# Patient Record
Sex: Female | Born: 1955 | Race: White | Hispanic: No | Marital: Married | Smoking: Never smoker
Health system: Southern US, Community
[De-identification: ages and names within clinical notes are randomized; demographics above are authoritative.]

## PROBLEM LIST (undated history)

## (undated) DIAGNOSIS — J301 Allergic rhinitis due to pollen: Secondary | ICD-10-CM

## (undated) DIAGNOSIS — A630 Anogenital (venereal) warts: Secondary | ICD-10-CM

## (undated) DIAGNOSIS — T7840XA Allergy, unspecified, initial encounter: Secondary | ICD-10-CM

## (undated) DIAGNOSIS — F32A Depression, unspecified: Secondary | ICD-10-CM

## (undated) DIAGNOSIS — M84374A Stress fracture, right foot, initial encounter for fracture: Secondary | ICD-10-CM

## (undated) DIAGNOSIS — J45909 Unspecified asthma, uncomplicated: Secondary | ICD-10-CM

## (undated) DIAGNOSIS — F329 Major depressive disorder, single episode, unspecified: Secondary | ICD-10-CM

## (undated) DIAGNOSIS — G43909 Migraine, unspecified, not intractable, without status migrainosus: Secondary | ICD-10-CM

## (undated) DIAGNOSIS — F419 Anxiety disorder, unspecified: Secondary | ICD-10-CM

## (undated) HISTORY — DX: Allergy, unspecified, initial encounter: T78.40XA

## (undated) HISTORY — DX: Migraine, unspecified, not intractable, without status migrainosus: G43.909

## (undated) HISTORY — DX: Stress fracture, right foot, initial encounter for fracture: M84.374A

## (undated) HISTORY — PX: BREAST SURGERY: SHX581

## (undated) HISTORY — DX: Allergic rhinitis due to pollen: J30.1

## (undated) HISTORY — PX: KNEE SURGERY: SHX244

## (undated) HISTORY — DX: Anogenital (venereal) warts: A63.0

## (undated) HISTORY — DX: Anxiety disorder, unspecified: F41.9

## (undated) HISTORY — PX: COLONOSCOPY: SHX174

## (undated) HISTORY — DX: Depression, unspecified: F32.A

## (undated) HISTORY — DX: Major depressive disorder, single episode, unspecified: F32.9

---

## 1997-11-23 ENCOUNTER — Encounter: Admission: RE | Admit: 1997-11-23 | Discharge: 1997-11-23 | Payer: Self-pay | Admitting: Sports Medicine

## 1999-01-05 ENCOUNTER — Other Ambulatory Visit: Admission: RE | Admit: 1999-01-05 | Discharge: 1999-01-05 | Payer: Self-pay | Admitting: *Deleted

## 2000-01-05 ENCOUNTER — Other Ambulatory Visit: Admission: RE | Admit: 2000-01-05 | Discharge: 2000-01-05 | Payer: Self-pay | Admitting: *Deleted

## 2000-03-29 ENCOUNTER — Encounter: Admission: RE | Admit: 2000-03-29 | Discharge: 2000-03-29 | Payer: Self-pay | Admitting: Internal Medicine

## 2000-03-29 ENCOUNTER — Encounter: Payer: Self-pay | Admitting: Internal Medicine

## 2000-04-09 ENCOUNTER — Other Ambulatory Visit: Admission: RE | Admit: 2000-04-09 | Discharge: 2000-04-09 | Payer: Self-pay | Admitting: General Surgery

## 2000-05-18 ENCOUNTER — Ambulatory Visit (HOSPITAL_BASED_OUTPATIENT_CLINIC_OR_DEPARTMENT_OTHER): Admission: RE | Admit: 2000-05-18 | Discharge: 2000-05-18 | Payer: Self-pay | Admitting: General Surgery

## 2000-05-18 ENCOUNTER — Encounter (INDEPENDENT_AMBULATORY_CARE_PROVIDER_SITE_OTHER): Payer: Self-pay | Admitting: *Deleted

## 2001-01-23 ENCOUNTER — Other Ambulatory Visit: Admission: RE | Admit: 2001-01-23 | Discharge: 2001-01-23 | Payer: Self-pay | Admitting: Obstetrics and Gynecology

## 2002-02-20 ENCOUNTER — Other Ambulatory Visit: Admission: RE | Admit: 2002-02-20 | Discharge: 2002-02-20 | Payer: Self-pay | Admitting: Obstetrics and Gynecology

## 2002-11-07 ENCOUNTER — Encounter (INDEPENDENT_AMBULATORY_CARE_PROVIDER_SITE_OTHER): Payer: Self-pay

## 2002-11-07 ENCOUNTER — Ambulatory Visit (HOSPITAL_COMMUNITY): Admission: RE | Admit: 2002-11-07 | Discharge: 2002-11-07 | Payer: Self-pay | Admitting: Obstetrics and Gynecology

## 2003-03-04 ENCOUNTER — Other Ambulatory Visit: Admission: RE | Admit: 2003-03-04 | Discharge: 2003-03-04 | Payer: Self-pay | Admitting: Obstetrics and Gynecology

## 2004-01-07 ENCOUNTER — Ambulatory Visit: Payer: Self-pay | Admitting: Internal Medicine

## 2004-05-05 ENCOUNTER — Ambulatory Visit: Payer: Self-pay | Admitting: Internal Medicine

## 2004-05-10 ENCOUNTER — Other Ambulatory Visit: Admission: RE | Admit: 2004-05-10 | Discharge: 2004-05-10 | Payer: Self-pay | Admitting: Obstetrics and Gynecology

## 2004-05-24 ENCOUNTER — Encounter: Admission: RE | Admit: 2004-05-24 | Discharge: 2004-05-24 | Payer: Self-pay | Admitting: Obstetrics and Gynecology

## 2005-01-24 ENCOUNTER — Encounter: Admission: RE | Admit: 2005-01-24 | Discharge: 2005-01-24 | Payer: Self-pay | Admitting: Internal Medicine

## 2005-01-24 ENCOUNTER — Ambulatory Visit: Payer: Self-pay | Admitting: Internal Medicine

## 2005-04-04 ENCOUNTER — Ambulatory Visit: Payer: Self-pay | Admitting: Internal Medicine

## 2005-04-25 ENCOUNTER — Ambulatory Visit: Payer: Self-pay | Admitting: Internal Medicine

## 2005-11-03 ENCOUNTER — Ambulatory Visit: Payer: Self-pay | Admitting: Internal Medicine

## 2005-11-10 ENCOUNTER — Ambulatory Visit: Payer: Self-pay | Admitting: Internal Medicine

## 2005-11-29 ENCOUNTER — Encounter: Admission: RE | Admit: 2005-11-29 | Discharge: 2005-11-29 | Payer: Self-pay | Admitting: Internal Medicine

## 2005-11-29 ENCOUNTER — Ambulatory Visit: Payer: Self-pay | Admitting: Internal Medicine

## 2006-10-11 ENCOUNTER — Ambulatory Visit: Payer: Self-pay | Admitting: Internal Medicine

## 2006-10-11 DIAGNOSIS — J029 Acute pharyngitis, unspecified: Secondary | ICD-10-CM | POA: Insufficient documentation

## 2006-10-11 DIAGNOSIS — R51 Headache: Secondary | ICD-10-CM | POA: Insufficient documentation

## 2006-10-11 DIAGNOSIS — R519 Headache, unspecified: Secondary | ICD-10-CM | POA: Insufficient documentation

## 2006-10-11 DIAGNOSIS — J309 Allergic rhinitis, unspecified: Secondary | ICD-10-CM | POA: Insufficient documentation

## 2006-10-11 DIAGNOSIS — G43909 Migraine, unspecified, not intractable, without status migrainosus: Secondary | ICD-10-CM | POA: Insufficient documentation

## 2006-10-11 LAB — CONVERTED CEMR LAB: Rapid Strep: NEGATIVE

## 2006-11-08 ENCOUNTER — Ambulatory Visit: Payer: Self-pay | Admitting: Internal Medicine

## 2006-11-08 DIAGNOSIS — M7120 Synovial cyst of popliteal space [Baker], unspecified knee: Secondary | ICD-10-CM | POA: Insufficient documentation

## 2008-06-23 ENCOUNTER — Telehealth (INDEPENDENT_AMBULATORY_CARE_PROVIDER_SITE_OTHER): Payer: Self-pay | Admitting: *Deleted

## 2008-07-09 ENCOUNTER — Ambulatory Visit: Payer: Self-pay | Admitting: Internal Medicine

## 2008-07-09 DIAGNOSIS — H04129 Dry eye syndrome of unspecified lacrimal gland: Secondary | ICD-10-CM | POA: Insufficient documentation

## 2008-07-09 DIAGNOSIS — L659 Nonscarring hair loss, unspecified: Secondary | ICD-10-CM | POA: Insufficient documentation

## 2008-07-09 LAB — CONVERTED CEMR LAB: Anti Nuclear Antibody(ANA): NEGATIVE

## 2008-07-13 LAB — CONVERTED CEMR LAB
ALT: 13 units/L (ref 0–35)
AST: 22 units/L (ref 0–37)
Albumin: 4 g/dL (ref 3.5–5.2)
Alkaline Phosphatase: 72 units/L (ref 39–117)
BUN: 6 mg/dL (ref 6–23)
Basophils Absolute: 0 10*3/uL (ref 0.0–0.1)
Basophils Relative: 0.5 % (ref 0.0–3.0)
Bilirubin, Direct: 0 mg/dL (ref 0.0–0.3)
CO2: 31 meq/L (ref 19–32)
Calcium: 9.2 mg/dL (ref 8.4–10.5)
Chloride: 104 meq/L (ref 96–112)
Cholesterol: 211 mg/dL — ABNORMAL HIGH (ref 0–200)
Creatinine, Ser: 0.6 mg/dL (ref 0.4–1.2)
Direct LDL: 124.9 mg/dL
Eosinophils Absolute: 0.2 10*3/uL (ref 0.0–0.7)
Eosinophils Relative: 3.2 % (ref 0.0–5.0)
Folate: 14.8 ng/mL
Free T4: 0.8 ng/dL (ref 0.6–1.6)
GFR calc non Af Amer: 110.44 mL/min (ref >60)
Glucose, Bld: 91 mg/dL (ref 70–99)
HCT: 40 % (ref 36.0–46.0)
HDL: 74.4 mg/dL (ref >39.00)
Hemoglobin: 13.9 g/dL (ref 12.0–15.0)
Lymphocytes Relative: 24.1 % (ref 12.0–46.0)
Lymphs Abs: 1.2 10*3/uL (ref 0.7–4.0)
MCHC: 34.9 g/dL (ref 30.0–36.0)
MCV: 98.1 fL (ref 78.0–100.0)
Monocytes Absolute: 0.3 10*3/uL (ref 0.1–1.0)
Monocytes Relative: 6.2 % (ref 3.0–12.0)
Neutro Abs: 3.3 10*3/uL (ref 1.4–7.7)
Neutrophils Relative %: 66 % (ref 43.0–77.0)
Platelets: 221 10*3/uL (ref 150.0–400.0)
Potassium: 3.9 meq/L (ref 3.5–5.1)
RBC: 4.08 M/uL (ref 3.87–5.11)
RDW: 11.1 % — ABNORMAL LOW (ref 11.5–14.6)
Sodium: 140 meq/L (ref 135–145)
T3, Free: 2.6 pg/mL (ref 2.3–4.2)
TSH: 1.59 microintl units/mL (ref 0.35–5.50)
Total Bilirubin: 0.9 mg/dL (ref 0.3–1.2)
Total CHOL/HDL Ratio: 3
Total Protein: 7.5 g/dL (ref 6.0–8.3)
Triglycerides: 82 mg/dL (ref 0.0–149.0)
VLDL: 16.4 mg/dL (ref 0.0–40.0)
Vitamin B-12: 872 pg/mL (ref 211–911)
WBC: 5 10*3/uL (ref 4.5–10.5)

## 2009-04-14 ENCOUNTER — Ambulatory Visit: Payer: Self-pay | Admitting: Family Medicine

## 2009-04-14 DIAGNOSIS — M531 Cervicobrachial syndrome: Secondary | ICD-10-CM | POA: Insufficient documentation

## 2010-01-25 ENCOUNTER — Ambulatory Visit: Payer: Self-pay | Admitting: Internal Medicine

## 2010-01-25 DIAGNOSIS — J019 Acute sinusitis, unspecified: Secondary | ICD-10-CM | POA: Insufficient documentation

## 2010-03-10 NOTE — Assessment & Plan Note (Signed)
Summary: ?sinus inf/cjr   Vital Signs:  Patient profile:   55 year old female Menstrual status:  postmenopausal Height:      67 inches Weight:      174 pounds BMI:     27.35 O2 Sat:      98 % on Room air Temp:     98.2 degrees F oral Pulse rate:   78 / minute BP sitting:   110 / 60  (right arm) Cuff size:   regular  Vitals Entered By: Romualdo Bolk, CMA (AAMA) (January 25, 2010 3:28 PM)  O2 Flow:  Room air CC: Sharp pains around ears, coughing up specs of blood, sinus pressure. This started on 12/19.   History of Present Illness: Carolyn Gutierrez comes in today  for  a few days  for coryza  and then 1 days of  malaise  and today coughed up   yellow phlegm and  and blood mixed in .  bright red  no nose bleeds and no  fever or pneumonia  problems but feels ba and tired and malaise.   now  has pain behind ear s like sinus pain.   but hearing ok  No NVD  no rash or chills   Preventive Screening-Counseling & Management  Alcohol-Tobacco     Alcohol drinks/day: 2     Smoking Status: never  Caffeine-Diet-Exercise     Caffeine use/day: 1-2     Does Patient Exercise: yes     Times/week: 3  Current Medications (verified): 1)  Multiple Vitamins   Tabs (Multiple Vitamin) 2)  Sudafed 30 Mg  Tabs (Pseudoephedrine Hcl) 3)  Estrace 0.1 Mg/gm Crea (Estradiol) .... As Directed  Allergies (verified): No Known Drug Allergies  Past History:  Past medical, surgical, family and social histories (including risk factors) reviewed, and no changes noted (except as noted below).  Past Medical History: Reviewed history from 07/09/2008 and no changes required. Allergic rhinitis Migraine headaches  Poss stress fx right foot 2007  Past Surgical History: Reviewed history from 07/09/2008 and no changes required. Breast Bx Knee Surgery right   Family History: Reviewed history from 07/09/2008 and no changes required. Father: Basal Cell, bladder cancer Mother: Died of pancreatic  cancer Siblings: Sister-melanoma, older sister- dm, hbp  SIS  arthrtis   Social History: Reviewed history from 07/09/2008 and no changes required. Never Smoked hh of 2  2 cats  no ets  is a vegan  taking biotin   Review of Systems       The patient complains of anorexia.  The patient denies vision loss, decreased hearing, chest pain, syncope, dyspnea on exertion, peripheral edema, prolonged cough, abdominal pain, abnormal bleeding, and enlarged lymph nodes.         achy  Physical Exam  General:  congested in nad alert, well-developed, well-nourished, and well-hydrated.   Head:  Normocephalic and atraumatic without obvious abnormalities. No apparent alopecia or balding. Eyes:  vision grossly intact, pupils equal, and pupils round.   Ears:  R ear normal, L ear normal, and no external deformities.   Nose:  no external deformity and no external erythema.  congested  Mouth:  very red  uvula no lesion   aitway good Neck:  tender ac nodes   no pc  supple Lungs:  Normal respiratory effort, chest expands symmetrically. Lungs are clear to auscultation, no crackles or wheezes.no dullness.   Heart:  Normal rate and regular rhythm. S1 and S2 normal without gallop, murmur, click, rub or  other extra sounds. Pulses:  pulses intact without delay   Neurologic:  non focal  Skin:  no ecchymoses and no petechiae.   Cervical Nodes:  see nex exam  Psych:  Oriented X3, normally interactive, good eye contact, and not anxious appearing.     Impression & Recommendations:  Problem # 1:  SINUSITIS - ACUTE-NOS (ICD-461.9) severity of signs and bloody discharge  will do empiric antibiotic rx  ... could be viral with allergic  underlying also  The following medications were removed from the medication list:    Flonase 50 Mcg/act Susp (Fluticasone propionate) .Marland Kitchen... 1 spray as directed Her updated medication list for this problem includes:    Sudafed 30 Mg Tabs (Pseudoephedrine hcl)    Augmentin 500-125 Mg  Tabs (Amoxicillin-pot clavulanate) .Marland Kitchen... 1 by mouth three times a day    Amoxicillin 500 Mg Tabs (Amoxicillin) .Marland Kitchen... 1 by mouth three times a day for sinusitis  Problem # 2:  PHARYNGITIS, ACUTE (ICD-462) with above  Her updated medication list for this problem includes:    Augmentin 500-125 Mg Tabs (Amoxicillin-pot clavulanate) .Marland Kitchen... 1 by mouth three times a day    Amoxicillin 500 Mg Tabs (Amoxicillin) .Marland Kitchen... 1 by mouth three times a day for sinusitis  Problem # 3:  ALLERGIC RHINITIS (ICD-477.9)  The following medications were removed from the medication list:    Flonase 50 Mcg/act Susp (Fluticasone propionate) .Marland Kitchen... 1 spray as directed Her updated medication list for this problem includes:    Sudafed 30 Mg Tabs (Pseudoephedrine hcl)  Complete Medication List: 1)  Multiple Vitamins Tabs (Multiple vitamin) 2)  Sudafed 30 Mg Tabs (Pseudoephedrine hcl) 3)  Estrace 0.1 Mg/gm Crea (Estradiol) .... As directed 4)  Augmentin 500-125 Mg Tabs (Amoxicillin-pot clavulanate) .Marland Kitchen.. 1 by mouth three times a day 5)  Amoxicillin 500 Mg Tabs (Amoxicillin) .Marland Kitchen.. 1 by mouth three times a day for sinusitis  Patient Instructions: 1)  Acute sinusitis symptoms for less than 10 days are not helped by antibiotics. Use warm moist compresses, and over the counter decongestants( only as directed). Call if no improvement in 5-7 days, sooner if increasing pain, fever, or new symptoms.  2)  however because of the severity of  your signs and the bloody phlegm wil treat with antibiotic  for now .    3)  saline nose spray also.  Prescriptions: AMOXICILLIN 500 MG TABS (AMOXICILLIN) 1 by mouth three times a day for sinusitis  #30 x 0   Entered and Authorized by:   Madelin Headings MD   Signed by:   Madelin Headings MD on 01/25/2010   Method used:   Electronically to        [facility] [address] [phone]* (retail)       [address]       [place], [place]  [address]       Ph: [phone] or [phone]       Fax:  [phone]   RxID:   [id number] AUGMENTIN 500-125 MG TABS (AMOXICILLIN-POT CLAVULANATE) 1 by mouth three times a day  #30 x 0   Entered and Authorized by:   Madelin Headings MD   Signed by:   Madelin Headings MD on 01/25/2010   Method used:   Electronically to        [facility] [address] [phone]* (retail)       [address]       [place], [place]  [address]  Ph: [phone] or [phone]       Fax: [phone]   RxID:   [id number]    Orders Added: 1)  Est. Patient Level IV [33295]   cancel the augmentin     pt to take the amoxicillin

## 2010-03-10 NOTE — Assessment & Plan Note (Signed)
Summary: neck pain//ccm   Vital Signs:  Patient profile:   55 year old female Menstrual status:  postmenopausal Temp:     98.8 degrees F oral BP sitting:   130 / 76  (left arm) Cuff size:   regular  Vitals Entered By: Sid Falcon LPN (April 14, 1608 1:51 PM) CC: left side neck pain X 3 days   History of Present Illness: Patient seen for acute visit for left upper neck pain and occipital pain. Symptoms started about 3 days ago it has not been progressive. Symptoms are fleeting usually only last a few seconds occasional throbbing quality. Tylenol and Sudafed helped. No clear exacerbating features. History of migraines but these are somewhat different. No radiation of pain down the upper extremity. Denies any numbness or weakness. No recent sore throat, fever, or rash.  Denies any associated nausea, vomiting, visual change, ataxia, focal weakness.   Not exacerbated with exertion  Allergies (verified): No Known Drug Allergies  Past History:  Past Medical History: Last updated: 07/09/2008 Allergic rhinitis Migraine headaches  Poss stress fx right foot 2007  Family History: Last updated: 07/09/2008 Father: Basal Cell, bladder cancer Mother: Died of pancreatic cancer Siblings: Sister-melanoma, older sister- dm, hbp  SIS  arthrtis   Social History: Last updated: 07/09/2008 Never Smoked hh of 2  2 cats  no ets  is a vegan  taking biotin  PMH reviewed for relevance, PSH reviewed for relevance  Review of Systems  The patient denies anorexia, fever, weight loss, vision loss, and decreased hearing.    Physical Exam  General:  Well-developed,well-nourished,in no acute distress; alert,appropriate and cooperative throughout examination Head:  Normocephalic and atraumatic without obvious abnormalities. No apparent alopecia or balding. Eyes:  pupils equal, pupils round, pupils reactive to light, and no optic disk abnormalities.   Ears:  External ear exam shows no significant  lesions or deformities.  Otoscopic examination reveals clear canals, tympanic membranes are intact bilaterally without bulging, retraction, inflammation or discharge. Hearing is grossly normal bilaterally. Mouth:  Oral mucosa and oropharynx without lesions or exudates.  Teeth in good repair. Neck:  No deformities, masses, or tenderness noted. Lungs:  Normal respiratory effort, chest expands symmetrically. Lungs are clear to auscultation, no crackles or wheezes. Heart:  Normal rate and regular rhythm. S1 and S2 normal without gallop, murmur, click, rub or other extra sounds. Neurologic:  alert & oriented X3, cranial nerves II-XII intact, strength normal in all extremities, gait normal, and DTRs symmetrical and normal.     Impression & Recommendations:  Problem # 1:  OCCIPITAL NEURALGIA (ICD-723.8) symptoms sound more compatible with neuralgia.  Observe for now since symptoms are very fleeting.  Consider consult with [facility] if symptoms persist.  Pt encouraged to f/u sooner if symptoms worsen or change.  Complete Medication List: 1)  Multiple Vitamins Tabs (Multiple vitamin) 2)  Flonase 50 Mcg/act Susp (Fluticasone propionate) .Marland Kitchen.. 1 spray as directed 3)  Sudafed 30 Mg Tabs (Pseudoephedrine hcl) 4)  Estrace 0.1 Mg/gm Crea (Estradiol) .... As directed  Patient Instructions: 1)  Follow up immediately if you note any progression of headache, visual changes, vomiting, focal weakness, exertional worsening of headache, or any other neurological symptoms. 2)  Follow up with Dr Fabian Sharp by next week if symptoms not improving.

## 2010-06-24 NOTE — Op Note (Signed)
   NAMEAdria Gutierrez                         ACCOUNT NO.:  [id number]   MEDICAL RECORD NO.:  [id number]                   PATIENT TYPE:  AMB   LOCATION:  SDC                                  FACILITY:  [facility]   PHYSICIAN:  Michelle L. Vincente Poli, M.D.            DATE OF BIRTH:  06-17-1955   DATE OF PROCEDURE:  11/07/2002  DATE OF DISCHARGE:                                 OPERATIVE REPORT   PREOPERATIVE DIAGNOSIS:  Vulvar pustule.   POSTOPERATIVE DIAGNOSIS:  Vulvar pustule.   PROCEDURE:  Excision of vulvar pustule.   SURGEON:  Michelle L. Vincente Poli, M.D.   ANESTHESIA:  MAC with local.   PROCEDURE:  The patient was taken to the operating room where she was given  sedation and placed in the dorsal lithotomy position.  The vulva was prepped  and draped in the usual sterile fashion.  Examination under anesthesia  revealed a very small pustule arising from the right aspect of the vulva.  Using local, it was infiltrated  beneath it and around it with good results.  A circumferential incision was then made with a scalpel around the vulvar  pustule and it was excised completely.  The incision was then closed using  interrupted 3-0 Monocryl suture.  At the end of the procedure no bleeding  was noted.  An ice pack was placed to the perineum and the patient was taken  to the recovery room in stable condition.                                               Michelle L. Vincente Poli, M.D.    Carolyn Gutierrez  D:  11/07/2002  T:  11/08/2002  Job:  [id number]

## 2010-06-24 NOTE — Op Note (Signed)
[place]  Patient:    Carolyn Gutierrez                        MRN: [id number] Proc. Date: 05/18/00 Adm. Date:  [id number] Attending:  Arlis Porta CC:         Luanna Cole. Lenord Fellers, M.D.   Operative Report  PREOPERATIVE DIAGNOSIS:  Left breast mass.  POSTOPERATIVE DIAGNOSIS:  Left breast mass.  PROCEDURE:  Excision of left breast mass.  SURGEON:  Adolph Pollack, M.D.  ANESTHESIA:  Local (1% lidocaine with epinephrine plus 0.5% plain Marcaine plus sodium bicarbonate) with MAC.  INDICATION:  [name] is a 55 year old female who presented to me with a cystic breast lesion.  I aspirated this but also noted that she had a solid lesion inferiorly and lateral to this at approximately the 2 to 3 oclock position of the left breast.  I did a needle aspiration of this and it was nondiagnostic.  The cystic fluid was consistent with fibrocystic changes.  I reexamined her and the mass was still there and I have recommended excisional biopsy and she was agreeable to that.  PROCEDURE:  She was placed supine on the operating table.  The mass was marked.  She was then given intravenous sedation.  Her left breast was sterilely prepped and draped.  A curvilinear incision was made in the lateral aspect of the left breast after infiltration of local anesthesia.  The mass was palpated, grasped with the Allis forceps and excised sharply.  It was sent fresh to pathology.  The biopsy bed was inspected for bleeding and this controlled with cautery and Vicryl suture ligatures.  Once hemostasis was adequate, the subcutaneous fat was loosely approximated with interrupted 3-0 Vicryl sutures.  The skin was closed with a 4-0 Monocryl subcuticular strips.  Steri-Strips and a sterile dressing were applied.  She tolerated the procedure well without any apparent complications and was taken to the recovery room in satisfactory condition. DD:  05/18/00 TD:   05/18/00 Job: [id number]

## 2011-08-11 ENCOUNTER — Telehealth: Payer: Self-pay | Admitting: Internal Medicine

## 2011-08-11 NOTE — Telephone Encounter (Signed)
(  [phone]

## 2011-08-11 NOTE — Telephone Encounter (Signed)
Caller: Levonne Spiller; PCP: Madelin Headings.; CB#: [phone];  Call regarding Pounding Behind Ear on and off for Past Few Days and Sinus Congestion and Occasional Cough- onset 08/05/11; Mild sore throat, jaw pain and Mild headaches on and off - taking ES Tylenol QD and helps some. She has some yellowish nasal discharge and eyes watery. Triage and Care advice per URI and Ear Sx Protocols and appnt advised within 24 hours for "constant or intermittent dull earache, throbbing in ears or feeling of fullness; may interfere with sleep...". Scheduled at [facility] office for 08/12/11 @ 0945.

## 2011-08-12 ENCOUNTER — Encounter: Payer: Self-pay | Admitting: Family Medicine

## 2011-08-12 ENCOUNTER — Ambulatory Visit (INDEPENDENT_AMBULATORY_CARE_PROVIDER_SITE_OTHER): Payer: 59 | Admitting: Family Medicine

## 2011-08-12 VITALS — BP 120/78 | Temp 97.7°F | Wt 169.0 lb

## 2011-08-12 DIAGNOSIS — J309 Allergic rhinitis, unspecified: Secondary | ICD-10-CM

## 2011-08-12 MED ORDER — FLUTICASONE PROPIONATE 50 MCG/ACT NA SUSP: 2.0000 | Freq: Every day | NASAL | Status: DC

## 2011-08-12 NOTE — Progress Notes (Signed)
  Subjective:    Patient ID: Carolyn Gutierrez, female    DOB: 29-Jan-1956, 56 y.o.   MRN: [id number]  HPI  Carolyn Gutierrez is a 56 yo fem w a 1 week ho of inc. AR despite taking otc claritin d.........[facility] only x 1 of asthma,,,,,,,,,,no fever etc  Review of Systems    gen neg Objective:   Physical Exam  wdwn female nad Heent,,neg exc. For 3 plus nasal edema Lungs,clear no asthma     Assessment & Plan:  AR,,,,,add steroid ns

## 2011-08-12 NOTE — Patient Instructions (Signed)
claritin plain SNS,,,,,,,,one shot bid

## 2011-11-30 ENCOUNTER — Other Ambulatory Visit: Payer: Self-pay | Admitting: Obstetrics and Gynecology

## 2012-01-06 ENCOUNTER — Encounter (HOSPITAL_COMMUNITY): Payer: Self-pay | Admitting: Pharmacist

## 2012-01-09 ENCOUNTER — Encounter (HOSPITAL_COMMUNITY)
Admission: RE | Admit: 2012-01-09 | Discharge: 2012-01-09 | Disposition: A | Discharge status: Home / Self Care | Payer: 59 | Source: Ambulatory Visit | Attending: Obstetrics and Gynecology | Admitting: Obstetrics and Gynecology

## 2012-01-09 ENCOUNTER — Encounter (HOSPITAL_COMMUNITY): Payer: Self-pay

## 2012-01-09 HISTORY — DX: Unspecified asthma, uncomplicated: J45.909

## 2012-01-09 LAB — SURGICAL PCR SCREEN
MRSA, PCR: NEGATIVE
Staphylococcus aureus: NEGATIVE

## 2012-01-09 LAB — CBC
MCHC: 33.6 g/dL (ref 30.0–36.0)
Platelets: 259 10*3/uL (ref 150–400)
RDW: 11.7 % (ref 11.5–15.5)

## 2012-01-09 NOTE — Patient Instructions (Signed)
Your procedure is scheduled on:01/17/12  Enter through the Main Entrance at :7am Pick up desk phone and dial [phone] and inform us of your arrival.  Please call [phone] if you have any problems the morning of surgery.  Remember: Do not eat or drink after midnight:Tuesday   DO NOT wear jewelry, eye make-up, lipstick,body lotion, or dark fingernail polish. Do not shave for 48 hours prior to surgery.  If you are to be admitted after surgery, leave suitcase in car until your room has been assigned.

## 2012-01-16 ENCOUNTER — Encounter (HOSPITAL_COMMUNITY): Payer: Self-pay | Admitting: Anesthesiology

## 2012-01-16 NOTE — Anesthesia Preprocedure Evaluation (Addendum)
Anesthesia Evaluation    Airway Mallampati: II TM Distance: >3 FB Neck ROM: Full    Dental No notable dental hx. (+) Teeth Intact   Pulmonary asthma ,  breath sounds clear to auscultation  Pulmonary exam normal       Cardiovascular negative cardio ROS  Rhythm:Regular Rate:Normal     Neuro/Psych  Headaches, negative psych ROS   GI/Hepatic negative GI ROS, Neg liver ROS,   Endo/Other  negative endocrine ROS  Renal/GU negative Renal ROS  negative genitourinary   Musculoskeletal negative musculoskeletal ROS (+)   Abdominal   Peds  Hematology negative hematology ROS (+)   Anesthesia Other Findings   Reproductive/Obstetrics Thickened Endometrium Right Ovarian Cyst                          Anesthesia Physical Anesthesia Plan  ASA: II  Anesthesia Plan: General   Post-op Pain Management:    Induction: Intravenous  Airway Management Planned: Oral ETT  Additional Equipment:   Intra-op Plan:   Post-operative Plan: Extubation in OR  Informed Consent: I have reviewed the patients History and Physical, chart, labs and discussed the procedure including the risks, benefits and alternatives for the proposed anesthesia with the patient or authorized representative who has indicated his/her understanding and acceptance.   Dental advisory given  Plan Discussed with: CRNA, Anesthesiologist and Surgeon  Anesthesia Plan Comments:         Anesthesia Quick Evaluation

## 2012-01-17 ENCOUNTER — Ambulatory Visit (HOSPITAL_COMMUNITY): Payer: 59 | Admitting: Anesthesiology

## 2012-01-17 ENCOUNTER — Inpatient Hospital Stay (HOSPITAL_COMMUNITY)
Admission: RE | Admit: 2012-01-17 | Discharge: 2012-01-19 | DRG: 743 | Disposition: A | Discharge status: Home / Self Care | Payer: 59 | Source: Ambulatory Visit | Attending: Obstetrics and Gynecology | Admitting: Obstetrics and Gynecology

## 2012-01-17 ENCOUNTER — Encounter (HOSPITAL_COMMUNITY): Payer: Self-pay | Admitting: *Deleted

## 2012-01-17 ENCOUNTER — Encounter (HOSPITAL_COMMUNITY): Payer: Self-pay | Admitting: Anesthesiology

## 2012-01-17 ENCOUNTER — Encounter (HOSPITAL_COMMUNITY)
Admission: RE | Disposition: A | Discharge status: Home / Self Care | Payer: Self-pay | Source: Ambulatory Visit | Attending: Obstetrics and Gynecology

## 2012-01-17 DIAGNOSIS — N840 Polyp of corpus uteri: Secondary | ICD-10-CM | POA: Diagnosis present

## 2012-01-17 DIAGNOSIS — N9489 Other specified conditions associated with female genital organs and menstrual cycle: Principal | ICD-10-CM | POA: Diagnosis present

## 2012-01-17 DIAGNOSIS — L659 Nonscarring hair loss, unspecified: Secondary | ICD-10-CM

## 2012-01-17 DIAGNOSIS — R9389 Abnormal findings on diagnostic imaging of other specified body structures: Secondary | ICD-10-CM | POA: Diagnosis present

## 2012-01-17 DIAGNOSIS — N83209 Unspecified ovarian cyst, unspecified side: Secondary | ICD-10-CM | POA: Diagnosis present

## 2012-01-17 DIAGNOSIS — N8000 Endometriosis of the uterus, unspecified: Secondary | ICD-10-CM | POA: Diagnosis present

## 2012-01-17 DIAGNOSIS — R109 Unspecified abdominal pain: Secondary | ICD-10-CM | POA: Diagnosis present

## 2012-01-17 DIAGNOSIS — J019 Acute sinusitis, unspecified: Secondary | ICD-10-CM

## 2012-01-17 DIAGNOSIS — J029 Acute pharyngitis, unspecified: Secondary | ICD-10-CM

## 2012-01-17 DIAGNOSIS — J309 Allergic rhinitis, unspecified: Secondary | ICD-10-CM

## 2012-01-17 DIAGNOSIS — H04129 Dry eye syndrome of unspecified lacrimal gland: Secondary | ICD-10-CM

## 2012-01-17 DIAGNOSIS — D259 Leiomyoma of uterus, unspecified: Secondary | ICD-10-CM | POA: Diagnosis present

## 2012-01-17 DIAGNOSIS — Z5331 Laparoscopic surgical procedure converted to open procedure: Secondary | ICD-10-CM

## 2012-01-17 DIAGNOSIS — M531 Cervicobrachial syndrome: Secondary | ICD-10-CM

## 2012-01-17 DIAGNOSIS — M7120 Synovial cyst of popliteal space [Baker], unspecified knee: Secondary | ICD-10-CM

## 2012-01-17 DIAGNOSIS — R51 Headache: Secondary | ICD-10-CM

## 2012-01-17 DIAGNOSIS — G43909 Migraine, unspecified, not intractable, without status migrainosus: Secondary | ICD-10-CM

## 2012-01-17 HISTORY — PX: SALPINGOOPHORECTOMY: SHX82

## 2012-01-17 HISTORY — PX: LAPAROSCOPIC ASSISTED VAGINAL HYSTERECTOMY: SHX5398

## 2012-01-17 HISTORY — PX: ABDOMINAL HYSTERECTOMY: SHX81

## 2012-01-17 LAB — BASIC METABOLIC PANEL
BUN: 6 mg/dL (ref 6–23)
Chloride: 104 mEq/L (ref 96–112)
GFR calc Af Amer: >90 mL/min (ref >90)
Potassium: 3.9 mEq/L (ref 3.5–5.1)
Sodium: 141 mEq/L (ref 135–145)

## 2012-01-17 SURGERY — HYSTERECTOMY, VAGINAL, LAPAROSCOPY-ASSISTED
Anesthesia: General | Site: Abdomen | Laterality: Right | Wound class: Clean Contaminated

## 2012-01-17 MED ORDER — FENTANYL CITRATE 0.05 MG/ML IJ SOLN
INTRAMUSCULAR | Status: AC
  Filled 2012-01-17: qty 5

## 2012-01-17 MED ORDER — NALOXONE HCL 0.4 MG/ML IJ SOLN: 0.4000 mg | INTRAMUSCULAR | Status: DC | PRN

## 2012-01-17 MED ORDER — NEOSTIGMINE METHYLSULFATE 1 MG/ML IJ SOLN
INTRAMUSCULAR | Status: DC | PRN
  Administered 2012-01-17: 3 mg via INTRAVENOUS

## 2012-01-17 MED ORDER — HYDROMORPHONE HCL PF 1 MG/ML IJ SOLN
INTRAMUSCULAR | Status: AC
  Administered 2012-01-17: 0.5 mg via INTRAVENOUS
  Filled 2012-01-17: qty 1

## 2012-01-17 MED ORDER — PROPOFOL 10 MG/ML IV EMUL
INTRAVENOUS | Status: DC | PRN
  Administered 2012-01-17: 160 mg via INTRAVENOUS

## 2012-01-17 MED ORDER — FENTANYL CITRATE 0.05 MG/ML IJ SOLN
INTRAMUSCULAR | Status: DC | PRN
  Administered 2012-01-17: 150 ug via INTRAVENOUS
  Administered 2012-01-17: 100 ug via INTRAVENOUS

## 2012-01-17 MED ORDER — PROPOFOL 10 MG/ML IV EMUL
INTRAVENOUS | Status: AC
  Filled 2012-01-17: qty 20

## 2012-01-17 MED ORDER — NEOSTIGMINE METHYLSULFATE 1 MG/ML IJ SOLN
INTRAMUSCULAR | Status: AC
  Filled 2012-01-17: qty 1

## 2012-01-17 MED ORDER — CEFAZOLIN SODIUM-DEXTROSE 2-3 GM-% IV SOLR
INTRAVENOUS | Status: AC
  Filled 2012-01-17: qty 50

## 2012-01-17 MED ORDER — DEXAMETHASONE SODIUM PHOSPHATE 10 MG/ML IJ SOLN
INTRAMUSCULAR | Status: AC
  Filled 2012-01-17: qty 1

## 2012-01-17 MED ORDER — TRAMADOL HCL 50 MG PO TABS
50.0000 mg | ORAL_TABLET | Freq: Four times a day (QID) | ORAL | Status: DC | PRN
  Administered 2012-01-17 – 2012-01-18 (×2): 50 mg via ORAL
  Filled 2012-01-17 (×2): qty 1

## 2012-01-17 MED ORDER — EPHEDRINE 5 MG/ML INJ
INTRAVENOUS | Status: AC
  Filled 2012-01-17: qty 10

## 2012-01-17 MED ORDER — DEXTROSE IN LACTATED RINGERS 5 % IV SOLN
INTRAVENOUS | Status: DC
  Administered 2012-01-17: 17:00:00 via INTRAVENOUS

## 2012-01-17 MED ORDER — DIPHENHYDRAMINE HCL 50 MG/ML IJ SOLN: 12.5000 mg | Freq: Four times a day (QID) | INTRAMUSCULAR | Status: DC | PRN

## 2012-01-17 MED ORDER — IBUPROFEN 600 MG PO TABS
600.0000 mg | ORAL_TABLET | Freq: Four times a day (QID) | ORAL | Status: DC | PRN
  Administered 2012-01-18 – 2012-01-19 (×3): 600 mg via ORAL
  Filled 2012-01-17 (×3): qty 1

## 2012-01-17 MED ORDER — LIDOCAINE HCL (CARDIAC) 20 MG/ML IV SOLN
INTRAVENOUS | Status: AC
  Filled 2012-01-17: qty 5

## 2012-01-17 MED ORDER — MIDAZOLAM HCL 2 MG/2ML IJ SOLN
INTRAMUSCULAR | Status: AC
  Filled 2012-01-17: qty 2

## 2012-01-17 MED ORDER — METOCLOPRAMIDE HCL 5 MG/ML IJ SOLN: 10.0000 mg | Freq: Once | INTRAMUSCULAR | Status: DC | PRN

## 2012-01-17 MED ORDER — KETOROLAC TROMETHAMINE 30 MG/ML IJ SOLN
30.0000 mg | Freq: Once | INTRAMUSCULAR | Status: AC
  Administered 2012-01-17: 30 mg via INTRAVENOUS

## 2012-01-17 MED ORDER — GLYCOPYRROLATE 0.2 MG/ML IJ SOLN
INTRAMUSCULAR | Status: DC | PRN
  Administered 2012-01-17: 0.2 mg via INTRAVENOUS
  Administered 2012-01-17: 0.4 mg via INTRAVENOUS

## 2012-01-17 MED ORDER — DEXAMETHASONE SODIUM PHOSPHATE 4 MG/ML IJ SOLN
INTRAMUSCULAR | Status: DC | PRN
  Administered 2012-01-17: 10 mg via INTRAVENOUS

## 2012-01-17 MED ORDER — HYDROMORPHONE HCL PF 1 MG/ML IJ SOLN
INTRAMUSCULAR | Status: AC
  Filled 2012-01-17: qty 1

## 2012-01-17 MED ORDER — ONDANSETRON HCL 4 MG/2ML IJ SOLN
INTRAMUSCULAR | Status: AC
  Filled 2012-01-17: qty 2

## 2012-01-17 MED ORDER — LACTATED RINGERS IV SOLN
INTRAVENOUS | Status: DC
  Administered 2012-01-17: 07:00:00 via INTRAVENOUS

## 2012-01-17 MED ORDER — BUPIVACAINE HCL (PF) 0.25 % IJ SOLN
INTRAMUSCULAR | Status: DC | PRN
  Administered 2012-01-17: 10 mL

## 2012-01-17 MED ORDER — CEFAZOLIN SODIUM-DEXTROSE 2-3 GM-% IV SOLR
2.0000 g | INTRAVENOUS | Status: AC
  Administered 2012-01-17: 2 g via INTRAVENOUS

## 2012-01-17 MED ORDER — ROCURONIUM BROMIDE 50 MG/5ML IV SOLN
INTRAVENOUS | Status: AC
  Filled 2012-01-17: qty 1

## 2012-01-17 MED ORDER — HYDROMORPHONE HCL PF 1 MG/ML IJ SOLN
INTRAMUSCULAR | Status: DC | PRN
  Administered 2012-01-17 (×2): 0.5 mg via INTRAVENOUS

## 2012-01-17 MED ORDER — KETOROLAC TROMETHAMINE 30 MG/ML IJ SOLN
INTRAMUSCULAR | Status: AC
  Administered 2012-01-17: 30 mg via INTRAVENOUS
  Filled 2012-01-17: qty 1

## 2012-01-17 MED ORDER — SODIUM CHLORIDE 0.9 % IJ SOLN: 9.0000 mL | INTRAMUSCULAR | Status: DC | PRN

## 2012-01-17 MED ORDER — ONDANSETRON HCL 4 MG/2ML IJ SOLN: 4.0000 mg | Freq: Four times a day (QID) | INTRAMUSCULAR | Status: DC | PRN

## 2012-01-17 MED ORDER — LIDOCAINE HCL (CARDIAC) 20 MG/ML IV SOLN
INTRAVENOUS | Status: DC | PRN
  Administered 2012-01-17: 50 mg via INTRAVENOUS

## 2012-01-17 MED ORDER — ROCURONIUM BROMIDE 100 MG/10ML IV SOLN
INTRAVENOUS | Status: DC | PRN
  Administered 2012-01-17: 50 mg via INTRAVENOUS

## 2012-01-17 MED ORDER — MEPERIDINE HCL 25 MG/ML IJ SOLN: 6.2500 mg | INTRAMUSCULAR | Status: DC | PRN

## 2012-01-17 MED ORDER — BUPIVACAINE HCL (PF) 0.25 % IJ SOLN
INTRAMUSCULAR | Status: AC
  Filled 2012-01-17: qty 30

## 2012-01-17 MED ORDER — DIPHENHYDRAMINE HCL 12.5 MG/5ML PO ELIX: 12.5000 mg | ORAL_SOLUTION | Freq: Four times a day (QID) | ORAL | Status: DC | PRN

## 2012-01-17 MED ORDER — HYDROMORPHONE HCL PF 1 MG/ML IJ SOLN
0.2500 mg | INTRAMUSCULAR | Status: DC | PRN
  Administered 2012-01-17 (×4): 0.5 mg via INTRAVENOUS

## 2012-01-17 MED ORDER — SCOPOLAMINE 1 MG/3DAYS TD PT72
MEDICATED_PATCH | TRANSDERMAL | Status: AC
  Administered 2012-01-17: 1.5 mg via TRANSDERMAL
  Filled 2012-01-17: qty 1

## 2012-01-17 MED ORDER — LACTATED RINGERS IR SOLN
Status: DC | PRN
  Administered 2012-01-17: 3000 mL

## 2012-01-17 MED ORDER — ONDANSETRON HCL 4 MG/2ML IJ SOLN
INTRAMUSCULAR | Status: DC | PRN
  Administered 2012-01-17: 4 mg via INTRAVENOUS

## 2012-01-17 MED ORDER — MENTHOL 3 MG MT LOZG: 1.0000 | LOZENGE | OROMUCOSAL | Status: DC | PRN

## 2012-01-17 MED ORDER — OXYCODONE-ACETAMINOPHEN 5-325 MG PO TABS
1.0000 | ORAL_TABLET | ORAL | Status: DC | PRN
  Administered 2012-01-18: 1 via ORAL
  Administered 2012-01-18: 2 via ORAL
  Administered 2012-01-18 – 2012-01-19 (×3): 1 via ORAL
  Filled 2012-01-17: qty 2
  Filled 2012-01-17 (×4): qty 1

## 2012-01-17 MED ORDER — LACTATED RINGERS IV SOLN
INTRAVENOUS | Status: DC
  Administered 2012-01-17 (×4): via INTRAVENOUS

## 2012-01-17 MED ORDER — MIDAZOLAM HCL 5 MG/5ML IJ SOLN
INTRAMUSCULAR | Status: DC | PRN
  Administered 2012-01-17: 2 mg via INTRAVENOUS

## 2012-01-17 MED ORDER — EPHEDRINE SULFATE 50 MG/ML IJ SOLN
INTRAMUSCULAR | Status: DC | PRN
  Administered 2012-01-17 (×2): 10 mg via INTRAVENOUS

## 2012-01-17 MED ORDER — HYDROMORPHONE 0.3 MG/ML IV SOLN
INTRAVENOUS | Status: DC
  Administered 2012-01-17: 12:00:00 via INTRAVENOUS
  Filled 2012-01-17: qty 25

## 2012-01-17 MED ORDER — GLYCOPYRROLATE 0.2 MG/ML IJ SOLN
INTRAMUSCULAR | Status: AC
  Filled 2012-01-17: qty 3

## 2012-01-17 MED ORDER — TEMAZEPAM 15 MG PO CAPS: 15.0000 mg | ORAL_CAPSULE | Freq: Every evening | ORAL | Status: DC | PRN

## 2012-01-17 MED ORDER — HEPARIN SODIUM (PORCINE) 5000 UNIT/ML IJ SOLN
INTRAMUSCULAR | Status: DC | PRN
  Administered 2012-01-17: 5000 [IU]

## 2012-01-17 MED ORDER — SCOPOLAMINE 1 MG/3DAYS TD PT72
1.0000 | MEDICATED_PATCH | TRANSDERMAL | Status: DC
  Administered 2012-01-17: 1.5 mg via TRANSDERMAL

## 2012-01-17 SURGICAL SUPPLY — 47 items
BARRIER ADHS 3X4 INTERCEED (GAUZE/BANDAGES/DRESSINGS) IMPLANT
BLADE SURG 10 STRL SS (BLADE) ×3 IMPLANT
CABLE HIGH FREQUENCY MONO STRZ (ELECTRODE) IMPLANT
CANISTER SUCTION 1500CC (MISCELLANEOUS) ×6 IMPLANT
CHLORAPREP W/TINT 26ML (MISCELLANEOUS) ×3 IMPLANT
CLOTH BEACON ORANGE TIMEOUT ST (SAFETY) ×3 IMPLANT
CONT PATH 16OZ SNAP LID 3702 (MISCELLANEOUS) ×3 IMPLANT
COVER TABLE BACK 60X90 (DRAPES) ×6 IMPLANT
DECANTER SPIKE VIAL GLASS SM (MISCELLANEOUS) ×3 IMPLANT
DERMABOND ADVANCED (GAUZE/BANDAGES/DRESSINGS) ×2
DERMABOND ADVANCED .7 DNX12 (GAUZE/BANDAGES/DRESSINGS) ×4 IMPLANT
ELECT REM PT RETURN 9FT ADLT (ELECTROSURGICAL) ×3
ELECTRODE REM PT RTRN 9FT ADLT (ELECTROSURGICAL) ×2 IMPLANT
EVACUATOR SMOKE 8.L (FILTER) IMPLANT
GLOVE BIO SURGEON STRL SZ 6.5 (GLOVE) ×9 IMPLANT
GLOVE BIO SURGEON STRL SZ7.5 (GLOVE) ×6 IMPLANT
GLOVE BIOGEL PI IND STRL 6.5 (GLOVE) ×4 IMPLANT
GLOVE BIOGEL PI INDICATOR 6.5 (GLOVE) ×2
GOWN STRL REIN XL XLG (GOWN DISPOSABLE) ×15 IMPLANT
NEEDLE INSUFFLATION 14GA 120MM (NEEDLE) ×3 IMPLANT
NS IRRIG 1000ML POUR BTL (IV SOLUTION) ×3 IMPLANT
PACK LAVH (CUSTOM PROCEDURE TRAY) ×3 IMPLANT
PROTECTOR NERVE ULNAR (MISCELLANEOUS) ×3 IMPLANT
SEALER TISSUE G2 CVD JAW 45CM (ENDOMECHANICALS) ×3 IMPLANT
SET IRRIG TUBING LAPAROSCOPIC (IRRIGATION / IRRIGATOR) ×3 IMPLANT
SOLUTION ELECTROLUBE (MISCELLANEOUS) IMPLANT
SPONGE LAP 18X18 X RAY DECT (DISPOSABLE) ×9 IMPLANT
SUT PLAIN 2 0 (SUTURE) ×1
SUT PLAIN ABS 2-0 CT1 27XMFL (SUTURE) ×2 IMPLANT
SUT VIC AB 0 CT1 18XCR BRD8 (SUTURE) ×4 IMPLANT
SUT VIC AB 0 CT1 27 (SUTURE) ×1
SUT VIC AB 0 CT1 27XBRD ANBCTR (SUTURE) ×2 IMPLANT
SUT VIC AB 0 CT1 36 (SUTURE) ×9 IMPLANT
SUT VIC AB 0 CT1 8-18 (SUTURE) ×2
SUT VIC AB 3-0 PS2 18 (SUTURE)
SUT VIC AB 3-0 PS2 18XBRD (SUTURE) IMPLANT
SUT VIC AB 4-0 KS 27 (SUTURE) ×3 IMPLANT
SUT VICRYL 0 TIES 12 18 (SUTURE) ×3 IMPLANT
SUT VICRYL 0 UR6 27IN ABS (SUTURE) ×3 IMPLANT
TOWEL OR 17X24 6PK STRL BLUE (TOWEL DISPOSABLE) ×6 IMPLANT
TRAY FOLEY CATH 14FR (SET/KITS/TRAYS/PACK) ×3 IMPLANT
TROCAR Z-THREAD BLADED 11X100M (TROCAR) ×3 IMPLANT
TROCAR Z-THREAD BLADED 5X100MM (TROCAR) ×3 IMPLANT
TUBING SUCTION BULK 100 FT (MISCELLANEOUS) ×3 IMPLANT
WARMER LAPAROSCOPE (MISCELLANEOUS) ×3 IMPLANT
WATER STERILE IRR 1000ML POUR (IV SOLUTION) ×3 IMPLANT
YANKAUER SUCT BULB TIP NO VENT (SUCTIONS) ×3 IMPLANT

## 2012-01-17 NOTE — Progress Notes (Signed)
History and physical on the chart. No significant changes Proceed with LAVH and RSO Patient marked Consent signed.

## 2012-01-17 NOTE — Progress Notes (Signed)
Dr Brayton Caves at bedside to evaluate need for additional pain medications.  Pt resting, but when awakened, c/o throbbing pain at a level 8.  Has been given Dilaudid and Toradol per order.  Given patient's current VS and respiratory rate, will forego additional pain meds at this time and d/c to floor with PCA as ordered by Dr Vincente Poli.  Patient acknowledges understanding.

## 2012-01-17 NOTE — Transfer of Care (Signed)
Immediate Anesthesia Transfer of Care Note  Patient: Carolyn Gutierrez  Procedure(s) Performed: Procedure(s) (LRB) with comments: LAPAROSCOPIC ASSISTED VAGINAL HYSTERECTOMY (N/A) - attempted  SALPINGO OOPHORECTOMY (Right) HYSTERECTOMY ABDOMINAL (N/A)  Patient Location: PACU  Anesthesia Type:General  Level of Consciousness: awake, alert  and oriented  Airway & Oxygen Therapy: Patient Spontanous Breathing and Patient connected to nasal cannula oxygen  Post-op Assessment: Report given to PACU RN and Post -op Vital signs reviewed and stable  Post vital signs: Reviewed and stable  Complications: No apparent anesthesia complications

## 2012-01-17 NOTE — OR Nursing (Addendum)
Abdominal Hysterectomy and right salpingoopherectomy Timeout and procedure start time of 0848.

## 2012-01-17 NOTE — Brief Op Note (Signed)
01/17/2012  9:39 AM  PATIENT:  Carolyn Gutierrez  56 y.o. female  PRE-OPERATIVE DIAGNOSIS:  thickened enodeometrium, right ovarian cyst  POST-OPERATIVE DIAGNOSIS:  thickened enodeometrium, right ovarian cyst, dense pelvic adhesions  PROCEDURE:  Procedure(s) (LRB) with comments: LAPAROSCOPIC ASSISTED VAGINAL HYSTERECTOMY (N/A) - attempted  SALPINGO OOPHORECTOMY (Right) HYSTERECTOMY ABDOMINAL (N/A) Pelvic Washings SURGEON:  Surgeon(s) and Role:    * Jeani Hawking, MD - Primary    * W Varney Baas, MD - Assisting  PHYSICIAN ASSISTANT:   ASSISTANTS: none   ANESTHESIA:   general  EBL:  Total I/O In: 1800 [I.V.:1800] Out: 150 [Urine:100; Blood:50]  BLOOD ADMINISTERED:none  DRAINS: Urinary Catheter (Foley)   LOCAL MEDICATIONS USED:  MARCAINE     SPECIMEN:  Source of Specimen:  uterus, cervix, right tube and ovary  DISPOSITION OF SPECIMEN:  PATHOLOGY  COUNTS:  YES  TOURNIQUET:  * No tourniquets in log *  DICTATION: .Other Dictation: Dictation Number [id number]  PLAN OF CARE: Admit to inpatient   PATIENT DISPOSITION:  PACU - hemodynamically stable.   Delay start of Pharmacological VTE agent (>24hrs) due to surgical blood loss or risk of bleeding: not applicable

## 2012-01-17 NOTE — Anesthesia Postprocedure Evaluation (Signed)
Anesthesia Post Note  Patient: Carolyn Gutierrez  Procedure(s) Performed: Procedure(s) (LRB): LAPAROSCOPIC ASSISTED VAGINAL HYSTERECTOMY (N/A) SALPINGO OOPHORECTOMY (Right) HYSTERECTOMY ABDOMINAL (N/A)  Anesthesia type: GA  Patient location: PACU  Post pain: Pain level controlled  Post assessment: Post-op Vital signs reviewed  Last Vitals:  Filed Vitals:   01/17/12 1030  BP: 110/66  Pulse: 56  Temp: 36.8 C  Resp: 18    Post vital signs: Reviewed  Level of consciousness: sedated  Complications: No apparent anesthesia complications

## 2012-01-17 NOTE — Op Note (Signed)
Carolyn Gutierrez, Carolyn Gutierrez             ACCOUNT NO.:  [id number]  MEDICAL RECORD NO.:  [id number]  LOCATION:  9310                          FACILITY:  [facility]  PHYSICIAN:  Dillion Stowers L. Devonn Giampietro, M.D.DATE OF BIRTH:  07-28-1955  DATE OF PROCEDURE:  01/17/2012 DATE OF DISCHARGE:                              OPERATIVE REPORT   PREOPERATIVE DIAGNOSIS:  Thickened endometrial stripe, right ovarian cyst.  POSTOPERATIVE DIAGNOSES:  Thickened endometrial stripe, right ovarian cyst.  Dense pelvic adhesions.  PROCEDURE:  Laparoscopy with conversion to exploratory laparotomy, lysis of adhesions, total abdominal hysterectomy, and right salpingo- oophorectomy with pelvic washings.  SURGEON:  Estelle Skibicki L. Vincente Poli, M.D.  ASSISTANTFreddy Finner, M.D.  ANESTHESIA:  General.  EBL:  100 mL.  COMPLICATIONS:  None.  DRAINS:  Foley.  PROCEDURE:  The patient was taken to the operating room.  She was intubated.  She was prepped and draped, and Foley catheter was inserted. A small infraumbilical incision was made and the Veress needle was inserted.  Pneumoperitoneum was performed.  The trocar was inserted. The laparoscope was introduced through the trocar sheath.  Immediately upon insertion, we noticed that she had dense omental adhesions in front of our field, but I was able to turn the camera and placed the patient in Trendelenburg position.  Once I did that, I could see that she had severe and dense adhesions involving the colon to the entire back wall of the uterus completely.  We were unable to visualize even the back wall of the uterus or the left side of the uterus.  She also had dense adhesions involving the bladder all the way to the fundus.  Because of the dense adhesions and the potential risk of a colon injury, I decided to convert to laparotomy.  Dr. Jennette Kettle also came in and visualized through the scope and agreed with me as well.  So, we removed the scope.  The incision was closed with  Dermabond and then made a low-transverse incision at the area of her previous incision, this was carried down to the fascia.  The fascia was scored in the midline and extended laterally.  Rectus muscles were separated in the midline.  The peritoneum was entered bluntly.  A self-retraining retractor was placed in the abdominal cavity, and large and small bowel were placed in the upper abdomen.  At this point, we then elevated the uterus and using Metzenbaum, and sharp and blunt dissection, we carefully and slowly took down the adhesions, they were very dense.  We avoided any type of injury to the colon.  Once we freed up the adhesions in the back wall, then we worked on anteriorly, and in this same way, we used both sharp and blunt dissection, taking down the adhesions.  At that point once we freed the adhesions completely, we then proceeded with the surgery.  Exam revealed that she had a large ovarian cyst on the right ovary that was not adherent, but once we felt the palpated the surface of the cyst, it did feel slightly bumpy.  I did perform pelvic washings and we set that aside and sent it to Pathology.  We then identified the round ligament, which was  attenuated on the right side, suture ligated that, developed anterior and posterior leaves of the broad ligament.  I then developed an avascular window beneath the infundibulopelvic ligament on the right side and placed the Heaney clamp across that.  We then resected.  We secured that pedicle using the suture ligature and a free tie of 0 Vicryl suture.  We then of note noticed that she had a surgically absent left tube and ovary.  I then developed the bladder flap in the front further and then we clamped the uterine artery on the right side at the level of internal os, and it was clamped, cut, and suture ligated using 0 Vicryl suture.  Likewise on the left side of the pelvis, the round ligament was again identified.  It was clamped and  suture ligated, and then we also clamped this uterine artery at the level of the internal os on the left side and that pedicle was clamped, cut, and suture ligated using 0 Vicryl suture.  The cervix was relatively short.  So after developing the bladder flap further and ensuring the bladder was well away from our clamps, we just placed straight Heaney clamps on either side, staying snug beside the cervix.  Each pedicle was clamped, cut, and suture ligated using 0 Vicryl suture.  We then used curved Heaney clamps on either side and clamped just beneath the external os.  The specimen was removed and then identified the cervix, uterus, right tube and right ovary, it was sent to Pathology along with the pelvic washings.  The angle stitches were placed using 0 Vicryl suture and three figure-of-eights were used to close the vaginal cuff with 0 Vicryl suture.  Irrigation was performed.  Hemostasis was excellent.  We noticed that we could not visualize an appendix, that appeared to be absent, maybe it was removed at the time of her previous oophorectomy. The peritoneum and rectus muscles were closed using 0 Vicryl.  There was some omental adhesions to the anterior abdominal wall, but we did not felt like we should take this down because there was no window and because we knew that they would stick back.  The fascia was closed using 0 Vicryl starting each corner and meeting in the midline.  After irrigation of subcutaneous layer, the skin was closed with plain gut, interrupted and the skin was closed with 4-0 Vicryl on a Keith needle. Dermabond was applied.  Local was infiltrated.  All sponge, lap, and instrument counts were correct x2.  The patient went to recovery room in stable condition.     Kydan Shanholtzer L. Vincente Poli, M.D.     Florestine Avers  D:  01/17/2012  T:  01/17/2012  Job:  [id number]

## 2012-01-17 NOTE — H&P (Signed)
56 year old G 0 presents for LAVH and RSO. She presented to my office on October 23 complaining of a pinching sensation on the right side of her lower abdomen. An ultrasound performed showed a very thick endometrial stripe at 3 cm and a 5.9 cm septated cyst on right ovary. CA 125 was normal and endometrial biopsy benign. She had a D and C and Hysteroscopy July 2012 for benign endometrial polyp.  Medical history  Asthma  Surgical history  D and C / Hysteroscopy Knee surgery  History of LSO  Meds None  NKDA  Family history  Bladder and pancreatic cancer Diabetes  ROS Pain Above  Afebrile VSS General alert and oriented Lung CTAB Car RRR Abdomen is soft and nontender  Pelvic Cervix is stenotic Fullness in right adnexal area  IMPRESSION: Thickened endometrial stripe ?endometrial polyp RIght adnexal cyst  PLAN: LAVH RSO Risks of surgery - anesthesia, injury to surrounding organs, bleeding, infection discussed with the patient

## 2012-01-18 LAB — CBC
MCH: 32.6 pg (ref 26.0–34.0)
MCHC: 33.7 g/dL (ref 30.0–36.0)
MCV: 96.5 fL (ref 78.0–100.0)
Platelets: 234 10*3/uL (ref 150–400)
RBC: 3.47 MIL/uL — ABNORMAL LOW (ref 3.87–5.11)
RDW: 11.6 % (ref 11.5–15.5)

## 2012-01-18 LAB — BASIC METABOLIC PANEL
BUN: 6 mg/dL (ref 6–23)
CO2: 28 mEq/L (ref 19–32)
Calcium: 9.2 mg/dL (ref 8.4–10.5)
Chloride: 100 mEq/L (ref 96–112)
Creatinine, Ser: 0.62 mg/dL (ref 0.50–1.10)
GFR calc Af Amer: >90 mL/min (ref >90)

## 2012-01-18 MED FILL — Heparin Sodium (Porcine) Inj 5000 Unit/ML: INTRAMUSCULAR | Qty: 1 | Status: AC

## 2012-01-18 NOTE — Anesthesia Postprocedure Evaluation (Signed)
  Anesthesia Post-op Note  Patient: Carolyn Gutierrez  Procedure(s) Performed: Procedure(s) (LRB) with comments: LAPAROSCOPIC ASSISTED VAGINAL HYSTERECTOMY (N/A) - attempted  SALPINGO OOPHORECTOMY (Right) HYSTERECTOMY ABDOMINAL (N/A)  Patient Location: [facility]  Anesthesia Type:General  Level of Consciousness: awake, alert , oriented and patient cooperative  Airway and Oxygen Therapy: Patient Spontanous Breathing  Post-op Pain: mild  Post-op Assessment: Patient's Cardiovascular Status Stable, Respiratory Function Stable, No signs of Nausea or vomiting and Pain level controlled  Post-op Vital Signs: stable  Complications: No apparent anesthesia complications

## 2012-01-18 NOTE — Progress Notes (Signed)
1 Day Post-Op Procedure(s) (LRB): LAPAROSCOPIC ASSISTED VAGINAL HYSTERECTOMY (N/A) SALPINGO OOPHORECTOMY (Right) HYSTERECTOMY ABDOMINAL (N/A)  Subjective: Patient reports tolerating PO and + flatus.    Objective: I have reviewed patient's vital signs, intake and output, medications and labs.  General: alert, cooperative and appears stated age Vaginal Bleeding: none abdomen is soft and nontender and incision is healing well  Assessment: s/p Procedure(s) (LRB) with comments: LAPAROSCOPIC ASSISTED VAGINAL HYSTERECTOMY (N/A) - attempted  SALPINGO OOPHORECTOMY (Right) HYSTERECTOMY ABDOMINAL (N/A): stable, progressing well and tolerating diet  Plan: Advance diet Encourage ambulation Advance to PO medication Discontinue IV fluids  LOS: 1 day    Haifa Hatton L 01/18/2012, 7:25 AM

## 2012-01-18 NOTE — Addendum Note (Signed)
Addendum  created 01/18/12 9604 by Earmon Phoenix, CRNA   Modules edited:Notes Section

## 2012-01-19 ENCOUNTER — Encounter (HOSPITAL_COMMUNITY): Payer: Self-pay | Admitting: Obstetrics and Gynecology

## 2012-01-19 MED ORDER — OXYCODONE-ACETAMINOPHEN 5-325 MG PO TABS
1.0000 | ORAL_TABLET | ORAL | Status: DC | PRN
Start: 2012-01-19 — End: 2012-03-12

## 2012-01-19 MED ORDER — IBUPROFEN 600 MG PO TABS: 600.0000 mg | ORAL_TABLET | Freq: Four times a day (QID) | ORAL | Status: DC | PRN

## 2012-01-19 NOTE — Progress Notes (Signed)
Pt is discharged in the care of husband. Downstairs per ambulatory. States pain is better. Abdominal incisions are clean and dry. Denies any pain or discomfort. . States  She understands all discharge instructions well.Questions asked and answered. Stable.

## 2012-01-19 NOTE — Discharge Summary (Signed)
Admission Diagnosis: Right ovarian cyst Thickened endometrium  Discharge Diagnosis: Same  Hospital Course: 56 year old female admitted for LAVH, RSO. At the time of surgery she was noted to have dense adhesions involving the colon to the uterus. We performed a TAH and RSO. She did very well post op. By post op day #2 she was ambulating, voiding, and tolerating a regular diet. She was discharged home on POD #2. She was discharged with Ibuprofen and Percocet. She will follow up in 1 week for an incision check. She was advised no driving for 1 week No intercourse for 6 weeks No heavy lifting for 2 weeks Call for fever, redness or abdominal pain

## 2012-01-19 NOTE — Progress Notes (Signed)
2 Days Post-Op Procedure(s) (LRB): LAPAROSCOPIC ASSISTED VAGINAL HYSTERECTOMY (N/A) SALPINGO OOPHORECTOMY (Right) HYSTERECTOMY ABDOMINAL (N/A)  Subjective: Patient reports tolerating PO, + flatus and no problems voiding.    Objective: I have reviewed patient's vital signs, intake and output, medications and labs.  General: alert, cooperative and appears stated age Vaginal Bleeding: none Abdomen is soft and non tender and incision clean dry and intact  Assessment: s/p Procedure(s) (LRB) with comments: LAPAROSCOPIC ASSISTED VAGINAL HYSTERECTOMY (N/A) - attempted  SALPINGO OOPHORECTOMY (Right) HYSTERECTOMY ABDOMINAL (N/A): stable, progressing well and tolerating diet  Plan: Discharge home Rx Ibuprofen and Percocet  LOS: 2 days    Darnella Zeiter L 01/19/2012, 7:44 AM

## 2012-01-19 NOTE — Plan of Care (Signed)
Problem: Phase II Progression Outcomes Goal: Incision/dressings dry and intact Outcome: Completed/Met Date Met:  01/19/12 Bruising below incision

## 2012-02-28 ENCOUNTER — Other Ambulatory Visit: Payer: Self-pay | Admitting: Obstetrics and Gynecology

## 2012-02-28 DIAGNOSIS — R928 Other abnormal and inconclusive findings on diagnostic imaging of breast: Secondary | ICD-10-CM

## 2012-03-08 ENCOUNTER — Ambulatory Visit
Admission: RE | Admit: 2012-03-08 | Discharge: 2012-03-08 | Disposition: A | Discharge status: Home / Self Care | Payer: 59 | Source: Ambulatory Visit | Attending: Obstetrics and Gynecology | Admitting: Obstetrics and Gynecology

## 2012-03-08 ENCOUNTER — Other Ambulatory Visit: Payer: Self-pay | Admitting: Obstetrics and Gynecology

## 2012-03-08 DIAGNOSIS — R928 Other abnormal and inconclusive findings on diagnostic imaging of breast: Secondary | ICD-10-CM

## 2012-03-08 DIAGNOSIS — N6320 Unspecified lump in the left breast, unspecified quadrant: Secondary | ICD-10-CM

## 2012-03-12 ENCOUNTER — Ambulatory Visit (INDEPENDENT_AMBULATORY_CARE_PROVIDER_SITE_OTHER): Payer: 59 | Admitting: Internal Medicine

## 2012-03-12 ENCOUNTER — Encounter: Payer: Self-pay | Admitting: Internal Medicine

## 2012-03-12 VITALS — BP 112/70 | HR 76 | Temp 98.0°F | Wt 167.0 lb

## 2012-03-12 DIAGNOSIS — R229 Localized swelling, mass and lump, unspecified: Secondary | ICD-10-CM

## 2012-03-12 DIAGNOSIS — IMO0002 Reserved for concepts with insufficient information to code with codable children: Secondary | ICD-10-CM

## 2012-03-12 NOTE — Patient Instructions (Signed)
This may be a cyst  But because of location and size would get ortho  to see this and advise. Someone will contact you about.  The appt.

## 2012-03-12 NOTE — Progress Notes (Signed)
Chief Complaint  Patient presents with  . Lump on bottom of foot    First noticed on Sunday when she walked.  Some what painful when wearing shoes.    HPI: Patient comes in today for SDA for  new problem evaluation.  insidious onset about 6 months ago  Of fullness left arch of foot and increase recently bothered her in some shoes . Noted large this past week.    No numbness, pain .  Trauma  Walks a lot  .  No redness no rx  ROS: See pertinent positives and negatives per HPI.  Had total hyst  In December for benign reasons.   Past Medical History  Diagnosis Date  . Allergy   . Migraine   . Stress fracture of right foot   . Asthma     in the past- no inhaler currently    Family History  Problem Relation Age of Onset  . Cancer Mother     pancreatic  . Cancer Father     basal cell, bladder  . Cancer Sister     melanoma  . Diabetes Sister   . Hypertension Sister   . Arthritis Sister     History   Social History  . Marital Status: Married    Spouse Name: N/A    Number of Children: N/A  . Years of Education: N/A   Social History Main Topics  . Smoking status: Never Smoker   . Smokeless tobacco: None  . Alcohol Use: Yes     Comment: beer daily  . Drug Use: No  . Sexually Active:    Other Topics Concern  . None   Social History Narrative   hh of 22 catsNo etsIs a vegan - taking biotin    Outpatient Encounter Prescriptions as of 03/12/2012  Medication Sig Dispense Refill  . CALCIUM PO Take 1 tablet by mouth daily.      . Cholecalciferol (VITAMIN D PO) Take 1 tablet by mouth daily.      . Cyanocobalamin (B-12 PO) Take 1 tablet by mouth daily.      Marland Kitchen ibuprofen (ADVIL,MOTRIN) 600 MG tablet Take 1 tablet (600 mg total) by mouth every 6 (six) hours as needed (mild pain).  60 tablet  0  . Multiple Vitamin (MULTIVITAMIN WITH MINERALS) TABS Take 1 tablet by mouth daily.      . Zinc 50 MG TABS Take 1 tablet by mouth daily.      . [DISCONTINUED] oxyCODONE-acetaminophen  (PERCOCET/ROXICET) 5-325 MG per tablet Take 1-2 tablets by mouth every 4 (four) hours as needed.  30 tablet  0    EXAM:  BP 112/70  Pulse 76  Temp 98 F (36.7 C) (Oral)  Wt 167 lb (75.751 kg)  SpO2 98%  There is no height on file to calculate BMI.  GENERAL: vitals reviewed and listed above, alert, oriented, appears well hydrated and in no acute distress Left foot   With 3 cm 2.5 firm cystic type lesion arch of foot  Non tender ? Mobile  nv other wise  Normal  PSYCH: pleasant and cooperative, no obvious depression or anxiety  ASSESSMENT AND PLAN: Discussed the following assessment and plan:  1. Lump on foot     ? cyst but fairly large and on arch and growing  left foot     -Patient advised to return or notify health care team  if symptoms worsen or persist or new concerns arise.  Patient Instructions  This may be a  cyst  But because of location and size would get ortho  to see this and advise. Someone will contact you about.  The appt.    Neta Mends. Izeyah Deike M.D.

## 2012-03-15 ENCOUNTER — Ambulatory Visit
Admission: RE | Admit: 2012-03-15 | Discharge: 2012-03-15 | Disposition: A | Discharge status: Home / Self Care | Payer: 59 | Source: Ambulatory Visit | Attending: Obstetrics and Gynecology | Admitting: Obstetrics and Gynecology

## 2012-03-15 ENCOUNTER — Inpatient Hospital Stay: Admission: RE | Admit: 2012-03-15 | Payer: 59 | Source: Ambulatory Visit

## 2012-03-15 ENCOUNTER — Other Ambulatory Visit: Payer: Self-pay | Admitting: Obstetrics and Gynecology

## 2012-03-15 DIAGNOSIS — N6320 Unspecified lump in the left breast, unspecified quadrant: Secondary | ICD-10-CM

## 2012-03-23 ENCOUNTER — Other Ambulatory Visit: Payer: Self-pay

## 2012-10-25 ENCOUNTER — Telehealth: Payer: Self-pay | Admitting: Internal Medicine

## 2012-10-25 NOTE — Telephone Encounter (Signed)
Called and spoke to the pt.  Informed her that she would need to be seen in the office to get an antibiotic.  She was already in the [place].  Advised she could stop at an urgent care along the way.

## 2012-10-25 NOTE — Telephone Encounter (Signed)
Caller: Carolyn Gutierrez/Patient; Phone: [phone]. 10/25/12 - Patient called because she is having frequent urination, no pain, no blood or fever and thinks she is getting a UTI. She was suppose to leave for [facility] earlier today, but will be leaving a soon as possible and will not be back until next Wednesday 10/30/12. She wants to know if her PCP - Dr. Berniece Andreas (or one of the other doctors could call in an antibiotic for her. Advised her that the doctors usually do not call in antibiotics for a patient without seeing them. Offered to find an appt for her this afternoon (10/25/12), but she refused. She asked if a note could be sent to her PCP and if the antibiotic could be called in to her pharmacy ([facility] on [address] so she could have it filled at [id number] in [place]. Please call her on her cell phone @ [phone].

## 2012-12-12 ENCOUNTER — Other Ambulatory Visit: Payer: Self-pay

## 2013-04-29 ENCOUNTER — Other Ambulatory Visit: Payer: Self-pay | Admitting: Obstetrics and Gynecology

## 2013-10-29 ENCOUNTER — Encounter: Payer: Self-pay | Admitting: Family Medicine

## 2013-10-29 ENCOUNTER — Ambulatory Visit (INDEPENDENT_AMBULATORY_CARE_PROVIDER_SITE_OTHER): Payer: 59 | Admitting: Family Medicine

## 2013-10-29 VITALS — BP 102/72 | HR 81 | Temp 97.7°F | Ht 67.0 in | Wt 153.5 lb

## 2013-10-29 DIAGNOSIS — B888 Other specified infestations: Secondary | ICD-10-CM

## 2013-10-29 DIAGNOSIS — F411 Generalized anxiety disorder: Secondary | ICD-10-CM

## 2013-10-29 DIAGNOSIS — L853 Xerosis cutis: Secondary | ICD-10-CM

## 2013-10-29 DIAGNOSIS — B882 Other arthropod infestations: Secondary | ICD-10-CM

## 2013-10-29 DIAGNOSIS — L851 Acquired keratosis [keratoderma] palmaris et plantaris: Secondary | ICD-10-CM

## 2013-10-29 NOTE — Patient Instructions (Signed)
-  hypoallergenic regimen for skin  -call exterminator

## 2013-10-29 NOTE — Progress Notes (Signed)
No chief complaint on file.   HPI:  Acute visit for Insect bites: -took in a dog and dog has fleas -dogs and cats being treated for fleas and she if VERY anxious about fleas in the home, transferring fleas to others, etc -feels a bite sensation at times -wonders if getting bit for a few weeks -occ burning sensation on skin throughout - sprayed chairs and sofas, etc with a flea and tick spray -using antibacterial soap -she is VERY anxious and stressed about fleas in the house, husband thinks she is crazy -denies: malaise, HA, fevers, breathing trouble, weakness, numbness  ROS: See pertinent positives and negatives per HPI.  Past Medical History  Diagnosis Date  . Allergy   . Migraine   . Stress fracture of right foot   . Asthma     in the past- no inhaler currently    Past Surgical History  Procedure Laterality Date  . Breast surgery      breast bx  . Knee surgery      right  . Laparoscopic assisted vaginal hysterectomy  01/17/2012    Procedure: LAPAROSCOPIC ASSISTED VAGINAL HYSTERECTOMY;  Surgeon: [name], MD;  Location: [facility];  Service: Gynecology;  Laterality: N/A;  attempted   . Salpingoophorectomy  01/17/2012    Procedure: SALPINGO OOPHORECTOMY;  Surgeon: [name], MD;  Location: [facility];  Service: Gynecology;  Laterality: Right;  . Abdominal hysterectomy  01/17/2012    Procedure: HYSTERECTOMY ABDOMINAL;  Surgeon: [name], MD;  Location: [facility];  Service: Gynecology;  Laterality: N/A;    Family History  Problem Relation Age of Onset  . Cancer Mother     pancreatic  . Cancer Father     basal cell, bladder  . Cancer Sister     melanoma  . Diabetes Sister   . Hypertension Sister   . Arthritis Sister     History   Social History  . Marital Status: Married    Spouse Name: N/A    Number of Children: N/A  . Years of Education: N/A   Social History Main Topics  . Smoking status: Never Smoker   . Smokeless tobacco: None  .  Alcohol Use: Yes     Comment: beer daily  . Drug Use: No  . Sexual Activity:    Other Topics Concern  . None   Social History Narrative   hh of 2   2 cats   No ets   Is a vegan - taking biotin    Current outpatient prescriptions:FLUoxetine (PROZAC) 20 MG tablet, Take 20 mg by mouth daily., Disp: , Rfl: ;  CALCIUM PO, Take 1 tablet by mouth daily., Disp: , Rfl: ;  Cholecalciferol (VITAMIN D PO), Take 1 tablet by mouth daily., Disp: , Rfl: ;  Cyanocobalamin (B-12 PO), Take 1 tablet by mouth daily., Disp: , Rfl:  ibuprofen (ADVIL,MOTRIN) 600 MG tablet, Take 1 tablet (600 mg total) by mouth every 6 (six) hours as needed (mild pain)., Disp: 60 tablet, Rfl: 0;  Multiple Vitamin (MULTIVITAMIN WITH MINERALS) TABS, Take 1 tablet by mouth daily., Disp: , Rfl:   EXAM:  Filed Vitals:   10/29/13 1057  BP: 102/72  Pulse: 81  Temp: 97.7 F (36.5 C)    Body mass index is 24.04 kg/(m^2).  GENERAL: vitals reviewed and listed above, alert, oriented, appears well hydrated and in no acute distress  HEENT: atraumatic, conjunttiva clear, no obvious abnormalities on inspection of external nose and ears  NECK:  no obvious masses on inspection  SKIN: normal except for mildly dry skin  MS: moves all extremities without noticeable abnormality  PSYCH: pleasant and cooperative, no obvious depression, anxious  ASSESSMENT AND PLAN:  Discussed the following assessment and plan:  Flea infestation  Anxiety state, unspecified  Dry skin  >25 minutes spentwith this patient, > 50% of the time counseling of flea infestation, treatment and management and the following: -discussed fleas and treatment and advised she contact an exterminator - number provided -no abnormal findings on exam today so appears she is not allergic to fleas -hypoallergenic regimen for dry skin as the harsh soap may be irritating the skin -follow up if any concerns or persistent symptoms -Patient advised to return or notify a  doctor immediately if symptoms worsen or persist or new concerns arise.  Patient Instructions  -hypoallergenic regimen for skin  -call exterminator     Colin Benton R.

## 2013-10-29 NOTE — Progress Notes (Signed)
Pre visit review using our clinic review tool, if applicable. No additional management support is needed unless otherwise documented below in the visit note. 

## 2013-11-15 ENCOUNTER — Encounter (HOSPITAL_COMMUNITY): Payer: Self-pay | Admitting: Emergency Medicine

## 2013-11-15 ENCOUNTER — Emergency Department (HOSPITAL_COMMUNITY)
Admission: EM | Admit: 2013-11-15 | Discharge: 2013-11-17 | Disposition: A | Discharge status: Other Acute Inpatient Hospital | Payer: 59 | Attending: Emergency Medicine | Admitting: Emergency Medicine

## 2013-11-15 DIAGNOSIS — F22 Delusional disorders: Secondary | ICD-10-CM | POA: Diagnosis not present

## 2013-11-15 DIAGNOSIS — Z008 Encounter for other general examination: Secondary | ICD-10-CM | POA: Diagnosis present

## 2013-11-15 DIAGNOSIS — Z8679 Personal history of other diseases of the circulatory system: Secondary | ICD-10-CM | POA: Insufficient documentation

## 2013-11-15 DIAGNOSIS — Z8781 Personal history of (healed) traumatic fracture: Secondary | ICD-10-CM | POA: Diagnosis not present

## 2013-11-15 DIAGNOSIS — J45909 Unspecified asthma, uncomplicated: Secondary | ICD-10-CM | POA: Diagnosis not present

## 2013-11-15 DIAGNOSIS — Z79899 Other long term (current) drug therapy: Secondary | ICD-10-CM | POA: Insufficient documentation

## 2013-11-15 LAB — CBC
HEMATOCRIT: 44.1 % (ref 36.0–46.0)
HEMOGLOBIN: 15.2 g/dL — AB (ref 12.0–15.0)
MCH: 32.8 pg (ref 26.0–34.0)
MCHC: 34.5 g/dL (ref 30.0–36.0)
MCV: 95.2 fL (ref 78.0–100.0)
PLATELETS: 316 10*3/uL (ref 150–400)
RBC: 4.63 MIL/uL (ref 3.87–5.11)
RDW: 11.5 % (ref 11.5–15.5)
WBC: 9.4 10*3/uL (ref 4.0–10.5)

## 2013-11-15 LAB — COMPREHENSIVE METABOLIC PANEL
ALT: 11 U/L (ref 0–35)
AST: 14 U/L (ref 0–37)
Albumin: 4.4 g/dL (ref 3.5–5.2)
Alkaline Phosphatase: 72 U/L (ref 39–117)
Anion gap: 13 (ref 5–15)
BILIRUBIN TOTAL: 0.8 mg/dL (ref 0.3–1.2)
BUN: 5 mg/dL — AB (ref 6–23)
CHLORIDE: 97 meq/L (ref 96–112)
CO2: 26 meq/L (ref 19–32)
CREATININE: 0.46 mg/dL — AB (ref 0.50–1.10)
Calcium: 9.7 mg/dL (ref 8.4–10.5)
GFR calc Af Amer: >90 mL/min (ref >90)
GLUCOSE: 116 mg/dL — AB (ref 70–99)
Potassium: 3.5 mEq/L — ABNORMAL LOW (ref 3.7–5.3)
Sodium: 136 mEq/L — ABNORMAL LOW (ref 137–147)
Total Protein: 7.6 g/dL (ref 6.0–8.3)

## 2013-11-15 LAB — URINALYSIS, ROUTINE W REFLEX MICROSCOPIC
Bilirubin Urine: NEGATIVE
GLUCOSE, UA: NEGATIVE mg/dL
Ketones, ur: 15 mg/dL — AB
Leukocytes, UA: NEGATIVE
Nitrite: NEGATIVE
Protein, ur: NEGATIVE mg/dL
Specific Gravity, Urine: 1.005 (ref 1.005–1.030)
UROBILINOGEN UA: 0.2 mg/dL (ref 0.0–1.0)
pH: 6.5 (ref 5.0–8.0)

## 2013-11-15 LAB — RAPID URINE DRUG SCREEN, HOSP PERFORMED
Amphetamines: NOT DETECTED
BARBITURATES: NOT DETECTED
BENZODIAZEPINES: NOT DETECTED
Cocaine: NOT DETECTED
Opiates: NOT DETECTED
Tetrahydrocannabinol: NOT DETECTED

## 2013-11-15 LAB — ETHANOL

## 2013-11-15 LAB — SALICYLATE LEVEL: Salicylate Lvl: <2 mg/dL — ABNORMAL LOW (ref 2.8–20.0)

## 2013-11-15 LAB — ACETAMINOPHEN LEVEL: Acetaminophen (Tylenol), Serum: <15 ug/mL (ref 10–30)

## 2013-11-15 LAB — URINE MICROSCOPIC-ADD ON

## 2013-11-15 MED ORDER — ONDANSETRON HCL 4 MG PO TABS: 4.0000 mg | ORAL_TABLET | Freq: Three times a day (TID) | ORAL | Status: DC | PRN

## 2013-11-15 MED ORDER — IBUPROFEN 200 MG PO TABS: 600.0000 mg | ORAL_TABLET | Freq: Three times a day (TID) | ORAL | Status: DC | PRN

## 2013-11-15 MED ORDER — ACETAMINOPHEN 325 MG PO TABS: 650.0000 mg | ORAL_TABLET | ORAL | Status: DC | PRN

## 2013-11-15 NOTE — BH Assessment (Signed)
Tucker, Steedley (Spouse) [phone] or [phone] and Jeanella Craze (Sister) [phone] or [phone] would like to be involved in patient's care as much as possible. Patient has signed a consent form and both individuals names are listed on this form. Writer has placed consent form in patient's chart.

## 2013-11-15 NOTE — ED Notes (Signed)
She comes to [place] with her sister and an adult female, who tell me pt. Has been exhibiting strange, paranoid behavior, which began rather abruptly on about Labor Day this year.  Among other things she continually battle with "fleas" of which I, nor her family see any evidence.  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  She is nervous and jittery in her behavior and in no distress.  They were able to get her to agree to come here under the guise of "get some medicine for the fleas".

## 2013-11-15 NOTE — ED Notes (Signed)
Pt and belongings wanded 

## 2013-11-15 NOTE — ED Notes (Signed)
Pts female visitor and sister took her belongs that were put in her bag to be wanded.

## 2013-11-15 NOTE — BH Assessment (Addendum)
Assessment Note  Carolyn Gutierrez is an 58 y.o. female that presents to [employer] with spouse and older sister. Family state that patient has exhibited bizarre behaviors starting Labor Day weekend. Apparently, patient adopted a dog who had fleas. The dog and patient's home was treated for fleas. Patient has continued complaining of dog fleas. Patient apparently scratches and looks for the fleas on her body consistently. Patient may have delusional thoughts stating she may be arrested by fleas. She also thinks that the fleas have taken over her home and or office at work. Patient reports that she was fired at her job this past Friday b/c of the fleas. Pt's spouse sts that this is not true and he has contacted her coworkers about pt's behaviors. Reportedly coworkers have noticed patient's bizarre behaviors and report concerns. Pt's office at work was treated for fleas 2-3x's since Labor Day. Coworkers have also followed patient home to insure that she makes it home ok.   Patient denies current SI and HI. However, patient reports suicidal ideations on/off since Labor Day. Sts that her last suicidal thoughts were last week. Pt did not have a suicide plan last week but reports that she was trying to think of a way to end her life. Her stressors include work stress, financial issues, thinking she loss her job, worsening anxiety, and also reports that she may loose her home. Sister reports that patient's job and home are both secure/safe. Sts that patient's home is already paid for and their father left them plenty of money. Although, patient denies AVH's she exhibits delusional thoughts. She is paranoid thinking others are going to harm her as well as mosquito's.   Patient denies drug use. She admits to drinking (2) beers (bottles) per day daily for many yrs. Her last drink was 1 week ago.   Patient does not have a current outpatient mental health provider. She reports that she did receive outpatient therapy in the  past for her severe anxiety.   Axis I: Psychotic Disorder NOS, Anxiety Disorder NOS, and Depressive Disorder NOS Axis II: Deferred Axis III:  Past Medical History  Diagnosis Date  . Allergy   . Migraine   . Stress fracture of right foot   . Asthma     in the past- no inhaler currently   Axis IV: other psychosocial or environmental problems, problems related to social environment, problems with access to health care services and problems with primary support group Axis V: 31-40 impairment in reality testing  Past Medical History:  Past Medical History  Diagnosis Date  . Allergy   . Migraine   . Stress fracture of right foot   . Asthma     in the past- no inhaler currently    Past Surgical History  Procedure Laterality Date  . Breast surgery      breast bx  . Knee surgery      right  . Laparoscopic assisted vaginal hysterectomy  01/17/2012    Procedure: LAPAROSCOPIC ASSISTED VAGINAL HYSTERECTOMY;  Surgeon: [name], MD;  Location: [facility];  Service: Gynecology;  Laterality: N/A;  attempted   . Salpingoophorectomy  01/17/2012    Procedure: SALPINGO OOPHORECTOMY;  Surgeon: [name], MD;  Location: [facility] [facility];  Service: Gynecology;  Laterality: Right;  . Abdominal hysterectomy  01/17/2012    Procedure: HYSTERECTOMY ABDOMINAL;  Surgeon: [name], MD;  Location: [facility];  Service: Gynecology;  Laterality: N/A;    Family History:  Family History  Problem Relation Age of Onset  . Cancer Mother     pancreatic  . Cancer Father     basal cell, bladder  . Cancer Sister     melanoma  . Diabetes Sister   . Hypertension Sister   . Arthritis Sister     Social History:  reports that she has never smoked. She does not have any smokeless tobacco history on file. She reports that she drinks alcohol. She reports that she does not use illicit drugs.  Additional Social History:  Alcohol / Drug Use Pain Medications: SEE MAR Prescriptions: SEE MAR Over the  Counter: SEE MAR History of alcohol / drug use?: Yes Substance #1 Name of Substance 1: Alcohol  1 - Age of First Use: unk 1 - Amount (size/oz): 2 beers  1 - Frequency: per night 1 - Duration: on-going  1 - Last Use / Amount: 2 weeks ago  CIWA: CIWA-Ar BP: 163/84 mmHg Pulse Rate: 91 COWS:    Allergies: No Known Allergies  Home Medications:  (Not in a hospital admission)  OB/GYN Status:  No LMP recorded. Patient is postmenopausal.  General Assessment Data Location of Assessment: [facility] Is this a Tele or Face-to-Face Assessment?: Face-to-Face Is this an Initial Assessment or a Re-assessment for this encounter?: Initial Assessment Living Arrangements: Spouse/significant other Can pt return to current living arrangement?: Yes Admission Status: Voluntary Is patient capable of signing voluntary admission?: Yes Transfer from: [place]: Spouse/significant other Name of Psychiatrist:  (No current psychiatrist) Name of Therapist:  (Therapist in the past (yrs ago); pt unable to recall name)     Risk to self with the past 6 months Suicidal Ideation: No-Not Currently/Within Last 6 Months Suicidal Intent: No (Pt reports thinking of ways but unable to formulate a plan) Is patient at risk for suicide?:  (pt denies SI at this time; last suicidal thoughts 1 week ago) Suicidal Plan?: No Access to Means: Yes Specify Access to Suicidal Means:  (household knives) What has been your use of drugs/alcohol within the last 12 months?:  (patient reports drinking beers 2x/day ) Previous Attempts/Gestures: No Other Self Harm Risks:  (none reported ) Triggers for Past Attempts: Other (Comment) (no previous attempts or gestures ) Intentional Self Injurious Behavior: None Family Suicide History: No Recent stressful life event(s): Other (Comment);Financial Problems (flea bites, afraid she loss job, and will loose housel) Persecutory  voices/beliefs?: No Depression: Yes Depression Symptoms: Feeling angry/irritable;Feeling worthless/self pity;Loss of interest in usual pleasures;Guilt;Tearfulness;Isolating;Fatigue;Despondent;Insomnia Substance abuse history and/or treatment for substance abuse?: No Suicide prevention information given to non-admitted patients: Not applicable  Risk to Others within the past 6 months Homicidal Ideation: No Thoughts of Harm to Others: No Current Homicidal Intent: No Current Homicidal Plan: No Access to Homicidal Means: No Identified Victim:  (n/a) History of harm to others?: No Assessment of Violence: None Noted Violent Behavior Description:  (patient is calm and cooperative) Does patient have access to weapons?: No Criminal Charges Pending?: No Does patient have a court date: No  Psychosis Hallucinations: None noted Delusions: None noted  Mental Status Report Appear/Hygiene: Unremarkable;Other (Comment) (appropriate ) Eye Contact: Fair Motor Activity: Restlessness Speech: Logical/coherent Level of Consciousness: Alert Mood: Depressed;Anxious;Suspicious Affect: Anxious Anxiety Level: Severe Thought Processes: Coherent;Circumstantial Judgement: Impaired Orientation: Time;Situation;Place;Person Obsessive Compulsive Thoughts/Behaviors: None  Cognitive Functioning Concentration: Decreased Memory: Remote Intact;Recent Intact IQ: Average Insight: Fair Impulse Control: Good Appetite: Poor Weight Loss:  (pt has loss 15-20 pounds since  Sept 1, 2015) Weight Gain:  (none reported )  ADLScreening [facility] Assessment Services) Patient's cognitive ability adequate to safely complete daily activities?: Yes Patient able to express need for assistance with ADLs?: Yes Independently performs ADLs?: Yes (appropriate for developmental age)  Prior Inpatient Therapy Prior Inpatient Therapy: No Prior Therapy Dates:  (n/a) Prior Therapy Facilty/Provider(s):  (n/a) Reason for Treatment:   (n/a)  Prior Outpatient Therapy Prior Outpatient Therapy: Yes Prior Therapy Dates:  (yrs ago) Prior Therapy Facilty/Provider(s):  (patient unable to recall name of provider) Reason for Treatment:  (therapy for anxiety )  ADL Screening (condition at time of admission) Patient's cognitive ability adequate to safely complete daily activities?: Yes Is the patient deaf or have difficulty hearing?: No Does the patient have difficulty seeing, even when wearing glasses/contacts?: Yes Does the patient have difficulty concentrating, remembering, or making decisions?: No Patient able to express need for assistance with ADLs?: Yes Does the patient have difficulty dressing or bathing?: No Independently performs ADLs?: Yes (appropriate for developmental age) Does the patient have difficulty walking or climbing stairs?: No Weakness of Legs: None Weakness of Arms/Hands: None  Home Assistive Devices/Equipment Home Assistive Devices/Equipment: None    Abuse/Neglect Assessment (Assessment to be complete while patient is alone) Physical Abuse: Denies Verbal Abuse: Denies Sexual Abuse: Denies Exploitation of patient/patient's resources: Denies Self-Neglect: Denies Values / Beliefs Cultural Requests During Hospitalization: None Spiritual Requests During Hospitalization: None   Advance Directives (For Healthcare) Does patient have an advance directive?: No Would patient like information on creating an advanced directive?: No - patient declined information Nutrition Screen- [facility] Adult/[facility] Patient's home diet: Regular  Additional Information 1:1 In Past 12 Months?: No CIRT Risk: No Elopement Risk: No Does patient have medical clearance?: Yes     Disposition:  Disposition Initial Assessment Completed for this Encounter: Yes Disposition of Patient: Other Financial controller discussed patient's disposition with Waylan Boga, NP and she recommends starting medications here in the ER. Patient  will be observed overnight and re-evaluated in the am for further dis positioning recommendations.   On Site Evaluation by:   Reviewed with Physician:    [facility] 11/15/2013 3:36 PM

## 2013-11-15 NOTE — ED Provider Notes (Signed)
CSN: [id number]     Arrival date & time 11/15/13  1214 History   First MD Initiated Contact with Patient 11/15/13 1231     Chief Complaint  Patient presents with  . Delusional     (Consider location/radiation/quality/duration/timing/severity/associated sxs/prior Treatment) HPI Comments: Pt states that she is here because she has had "fleas" since labor day. Pt states that she feels very jittery and thing are very overwhelming over the last couple of months. She states "I am going to be homeless on Monday". She states that she has not performed well in her job and and she is going to loose her jobs. Family states that she has not been acting herself over the last 6-8 months. She says thing that are not accurate, "family states that she has plenty of money she is not going to loose her job or her house". Her pcp tried to start her on prozac but she took for a couple of days and no more. Denies hearing voices or seeing things. States that she has thought of hurting herself because things are so overwhelming. Denies an plans of suicide.  The history is provided by the patient, the spouse and a relative. No language interpreter was used.    Past Medical History  Diagnosis Date  . Allergy   . Migraine   . Stress fracture of right foot   . Asthma     in the past- no inhaler currently   Past Surgical History  Procedure Laterality Date  . Breast surgery      breast bx  . Knee surgery      right  . Laparoscopic assisted vaginal hysterectomy  01/17/2012    Procedure: LAPAROSCOPIC ASSISTED VAGINAL HYSTERECTOMY;  Surgeon: [name], MD;  Location: [facility];  Service: Gynecology;  Laterality: N/A;  attempted   . Salpingoophorectomy  01/17/2012    Procedure: SALPINGO OOPHORECTOMY;  Surgeon: [name], MD;  Location: [place] [place] [facility];  Service: Gynecology;  Laterality: Right;  . Abdominal hysterectomy  01/17/2012    Procedure: HYSTERECTOMY ABDOMINAL;  Surgeon: [name], MD;  Location:  [place] [facility];  Service: Gynecology;  Laterality: N/A;   Family History  Problem Relation Age of Onset  . Cancer Mother     pancreatic  . Cancer Father     basal cell, bladder  . Cancer Sister     melanoma  . Diabetes Sister   . Hypertension Sister   . Arthritis Sister    History  Substance Use Topics  . Smoking status: Never Smoker   . Smokeless tobacco: Not on file  . Alcohol Use: Yes     Comment: beer daily   OB History   Grav Para Term Preterm Abortions TAB SAB Ect Mult Living                 Review of Systems  All other systems reviewed and are negative.     Allergies  Review of patient's allergies indicates no known allergies.  Home Medications   Prior to Admission medications   Medication Sig Start Date End Date Taking? Authorizing Provider  CALCIUM PO Take 1 tablet by mouth daily.    Historical Provider, MD  Cholecalciferol (VITAMIN D PO) Take 1 tablet by mouth daily.    Historical Provider, MD  Cyanocobalamin (B-12 PO) Take 1 tablet by mouth daily.    Historical Provider, MD  FLUoxetine (PROZAC) 20 MG tablet Take 20 mg by mouth daily.    Historical Provider, MD  ibuprofen (ADVIL,MOTRIN) 600 MG tablet Take 1 tablet (600 mg total) by mouth every 6 (six) hours as needed (mild pain). 01/19/12   [name], MD  Multiple Vitamin (MULTIVITAMIN WITH MINERALS) TABS Take 1 tablet by mouth daily.    Historical Provider, MD   BP 163/84  Pulse 91  Temp(Src) 98 F (36.7 C) (Oral)  Resp 16  SpO2 99% Physical Exam  Nursing note and vitals reviewed. Constitutional: She is oriented to person, place, and time. She appears well-developed and well-nourished.  Cardiovascular: Normal rate and regular rhythm.   Pulmonary/Chest: Effort normal and breath sounds normal.  Abdominal: Bowel sounds are normal. There is no tenderness.  Musculoskeletal: Normal range of motion.  Neurological: She is alert and oriented to person, place, and time. Coordination normal.  Skin: Skin is  warm and dry.  Psychiatric:  Pt constantly picking at skin. Very hesitant with answers    ED Course  Procedures (including critical care time) Labs Review Labs Reviewed  CBC - Abnormal; Notable for the following:    Hemoglobin 15.2 (*)    All other components within normal limits  COMPREHENSIVE METABOLIC PANEL - Abnormal; Notable for the following:    Sodium 136 (*)    Potassium 3.5 (*)    Glucose, Bld 116 (*)    BUN 5 (*)    Creatinine, Ser 0.46 (*)    All other components within normal limits  SALICYLATE LEVEL - Abnormal; Notable for the following:    Salicylate Lvl <1.6 (*)    All other components within normal limits  ACETAMINOPHEN LEVEL  ETHANOL  URINE RAPID DRUG SCREEN (HOSP PERFORMED)    Imaging Review No results found.   EKG Interpretation None      MDM   Final diagnoses:  Delusional thoughts    tts to see pt 5:01 PM Pt is to be in psych ed over night   Glendell Docker, NP 11/15/13 1701

## 2013-11-16 ENCOUNTER — Emergency Department (HOSPITAL_COMMUNITY): Payer: 59

## 2013-11-16 DIAGNOSIS — F329 Major depressive disorder, single episode, unspecified: Secondary | ICD-10-CM

## 2013-11-16 DIAGNOSIS — F29 Unspecified psychosis not due to a substance or known physiological condition: Secondary | ICD-10-CM

## 2013-11-16 MED ORDER — OLANZAPINE 5 MG PO TBDP
5.0000 mg | ORAL_TABLET | Freq: Two times a day (BID) | ORAL | Status: DC
  Administered 2013-11-16 – 2013-11-17 (×3): 5 mg via ORAL
  Filled 2013-11-16 (×3): qty 1

## 2013-11-16 MED ORDER — LORAZEPAM 1 MG PO TABS
1.0000 mg | ORAL_TABLET | Freq: Once | ORAL | Status: AC
  Administered 2013-11-16: 1 mg via ORAL
  Filled 2013-11-16: qty 1

## 2013-11-16 NOTE — ED Notes (Signed)
Off unit in MRI with mental health tech.

## 2013-11-16 NOTE — ED Notes (Signed)
Back from MRI. Stating "that wasn't too bad". Resting comfortably.

## 2013-11-16 NOTE — BH Assessment (Addendum)
[place] Assessment Progress Note  The following facilities were contacted in an attempt to place the pt:  [place]-left message @ [address] [facility]-at capacity per [facility] @ [place] per Kayla @ [address] [facility]-left message @ [address]-left message @ [address] [place]-beds per Estill Bamberg @ 1236-referral faxed for review [place]-at capacity per [facility] @ [address] [facility]- beds per Mardene Celeste @ 1243-referral faxed for review  TTS to follow up with referrals and continue to seek placement for the pt.    Shaune Pascal, MS, [facility] Licensed Professional Counselor Therapeutic Triage Specialist [facility] Phone: [phone] Fax: [phone]

## 2013-11-16 NOTE — BH Assessment (Signed)
[place] Assessment Progress Note   Update:  Left message for pt's spouse, Nicole Kindred Simcoe-[phone]-to call ED back with additional information needed for the pt per Dr. Harrington Challenger.  Shaune Pascal, MS, [facility] Licensed Professional Counselor Therapeutic Triage Specialist [facility] Phone: [phone] Fax: [phone]

## 2013-11-16 NOTE — ED Provider Notes (Signed)
Medical screening examination/treatment/procedure(s) were performed by non-physician practitioner and as supervising physician I was immediately available for consultation/collaboration.   EKG Interpretation None        Hoy Morn, MD 11/16/13 [phone]

## 2013-11-16 NOTE — ED Notes (Signed)
Pt visiting with family, denies SI, HI or AV hallucinations, will continue to monitor for safety.

## 2013-11-16 NOTE — ED Notes (Signed)
Left unit with mental health tech going to MRI. Ativan 1mg  given prior to leaving per order.

## 2013-11-16 NOTE — Consult Note (Signed)
[place] Psychiatry Consult   Reason for Consult:  Depressed, delusions Referring Physician:  EDP Carolyn Gutierrez is an 58 y.o. female. Total Time spent with patient: 1 hour  Assessment: AXIS I:  Major Depression, single episode and Psychotic Disorder NOS AXIS II:  Deferred AXIS III:   Past Medical History  Diagnosis Date  . Allergy   . Migraine   . Stress fracture of right foot   . Asthma     in the past- no inhaler currently   AXIS IV:  occupational problems AXIS V:  21-30 behavior considerably influenced by delusions or hallucinations OR serious impairment in judgment, communication OR inability to function in almost all areas  Plan:  Recommend psychiatric Inpatient admission when medically cleared.  Subjective:   Carolyn Gutierrez is a 58 y.o. female patient admitted with delusions and depression.  HPI:  This patient is a 58 year old married white female who lives with her husband. She works Environmental education officer. She has no history of mental illness but according to husband she has been very stressed at work. She also has a history of being obsessional about food and is vegan. Around Labor Day she took in a stray dog who has fleas.The dog only stayed a couple days. Since then she has been obsessed that the fleas are crawling on her and are all over her house. Her husband denies any of this. She also claims that she has made big mistakes at work and she will be sued. Again, this is not true. She is not sleeping or eating, anxious and depressed and unable to function. She has no recent changes in meds, medical illnesses or trauma . She has had suicidal thoughts and feels guilty without suicide plan. HPI Elements:   Location:  global. Quality:  severe. Severity:  severe. Timing:  subacute. Duration:  2 months. Context:  work stress.  Past Psychiatric History: Past Medical History  Diagnosis Date  . Allergy   . Migraine   . Stress fracture of right foot    . Asthma     in the past- no inhaler currently    reports that she has never smoked. She does not have any smokeless tobacco history on file. She reports that she drinks alcohol. She reports that she does not use illicit drugs. Family History  Problem Relation Age of Onset  . Cancer Mother     pancreatic  . Cancer Father     basal cell, bladder  . Cancer Sister     melanoma  . Diabetes Sister   . Hypertension Sister   . Arthritis Sister    Family History Family Supports: Yes, List: Living Arrangements: Spouse/significant other Can pt return to current living arrangement?: Yes Abuse/Neglect [facility]) Physical Abuse: Denies Verbal Abuse: Denies Sexual Abuse: Denies Allergies:  No Known Allergies   Objective: Blood pressure 138/75, pulse 81, temperature 98.4 F (36.9 C), temperature source Oral, resp. rate 16, SpO2 97.00%.There is no weight on file to calculate BMI. Results for orders placed during the hospital encounter of 11/15/13 (from the past 72 hour(s))  ACETAMINOPHEN LEVEL     Status: None   Collection Time    11/15/13  1:22 PM      Result Value Ref Range   Acetaminophen (Tylenol), Serum <15.0  10 - 30 ug/mL   Comment:            THERAPEUTIC CONCENTRATIONS VARY     SIGNIFICANTLY. A RANGE OF 10-30  ug/mL MAY BE AN EFFECTIVE     CONCENTRATION FOR MANY PATIENTS.     HOWEVER, SOME ARE BEST TREATED     AT CONCENTRATIONS OUTSIDE THIS     RANGE.     ACETAMINOPHEN CONCENTRATIONS     >150 ug/mL AT 4 HOURS AFTER     INGESTION AND >50 ug/mL AT 12     HOURS AFTER INGESTION ARE     OFTEN ASSOCIATED WITH TOXIC     REACTIONS.  CBC     Status: Abnormal   Collection Time    11/15/13  1:22 PM      Result Value Ref Range   WBC 9.4  4.0 - 10.5 K/uL   RBC 4.63  3.87 - 5.11 MIL/uL   Hemoglobin 15.2 (*) 12.0 - 15.0 g/dL   HCT 44.1  36.0 - 46.0 %   MCV 95.2  78.0 - 100.0 fL   MCH 32.8  26.0 - 34.0 pg   MCHC 34.5  30.0 - 36.0 g/dL   RDW 11.5  11.5 - 15.5 %   Platelets 316   150 - 400 K/uL  COMPREHENSIVE METABOLIC PANEL     Status: Abnormal   Collection Time    11/15/13  1:22 PM      Result Value Ref Range   Sodium 136 (*) 137 - 147 mEq/L   Potassium 3.5 (*) 3.7 - 5.3 mEq/L   Chloride 97  96 - 112 mEq/L   CO2 26  19 - 32 mEq/L   Glucose, Bld 116 (*) 70 - 99 mg/dL   BUN 5 (*) 6 - 23 mg/dL   Creatinine, Ser 0.46 (*) 0.50 - 1.10 mg/dL   Calcium 9.7  8.4 - 10.5 mg/dL   Total Protein 7.6  6.0 - 8.3 g/dL   Albumin 4.4  3.5 - 5.2 g/dL   AST 14  0 - 37 U/L   ALT 11  0 - 35 U/L   Alkaline Phosphatase 72  39 - 117 U/L   Total Bilirubin 0.8  0.3 - 1.2 mg/dL   GFR calc non Af Amer >90  >90 mL/min   GFR calc Af Amer >90  >90 mL/min   Comment: (NOTE)     The eGFR has been calculated using the CKD EPI equation.     This calculation has not been validated in all clinical situations.     eGFR's persistently <90 mL/min signify possible Chronic Kidney     Disease.   Anion gap 13  5 - 15  ETHANOL     Status: None   Collection Time    11/15/13  1:22 PM      Result Value Ref Range   Alcohol, Ethyl (B) <11  0 - 11 mg/dL   Comment:            LOWEST DETECTABLE LIMIT FOR     SERUM ALCOHOL IS 11 mg/dL     FOR MEDICAL PURPOSES ONLY  SALICYLATE LEVEL     Status: Abnormal   Collection Time    11/15/13  1:22 PM      Result Value Ref Range   Salicylate Lvl <2.0 (*) 2.8 - 20.0 mg/dL  URINE RAPID DRUG SCREEN (HOSP PERFORMED)     Status: None   Collection Time    11/15/13  2:21 PM      Result Value Ref Range   Opiates NONE DETECTED  NONE DETECTED   Cocaine NONE DETECTED  NONE DETECTED   Benzodiazepines NONE DETECTED    NONE DETECTED   Amphetamines NONE DETECTED  NONE DETECTED   Tetrahydrocannabinol NONE DETECTED  NONE DETECTED   Barbiturates NONE DETECTED  NONE DETECTED   Comment:            DRUG SCREEN FOR MEDICAL PURPOSES     ONLY.  IF CONFIRMATION IS NEEDED     FOR ANY PURPOSE, NOTIFY LAB     WITHIN 5 DAYS.                LOWEST DETECTABLE LIMITS     FOR URINE  DRUG SCREEN     Drug Class       Cutoff (ng/mL)     Amphetamine      1000     Barbiturate      200     Benzodiazepine   734     Tricyclics       193     Opiates          300     Cocaine          300     THC              50  URINALYSIS, ROUTINE W REFLEX MICROSCOPIC     Status: Abnormal   Collection Time    11/15/13  2:39 PM      Result Value Ref Range   Color, Urine YELLOW  YELLOW   APPearance CLEAR  CLEAR   Specific Gravity, Urine 1.005  1.005 - 1.030   pH 6.5  5.0 - 8.0   Glucose, UA NEGATIVE  NEGATIVE mg/dL   Hgb urine dipstick TRACE (*) NEGATIVE   Bilirubin Urine NEGATIVE  NEGATIVE   Ketones, ur 15 (*) NEGATIVE mg/dL   Protein, ur NEGATIVE  NEGATIVE mg/dL   Urobilinogen, UA 0.2  0.0 - 1.0 mg/dL   Nitrite NEGATIVE  NEGATIVE   Leukocytes, UA NEGATIVE  NEGATIVE  URINE MICROSCOPIC-ADD ON     Status: None   Collection Time    11/15/13  2:39 PM      Result Value Ref Range   Squamous Epithelial / LPF RARE  RARE   Labs are reviewed and are pertinent for none.  Current Facility-Administered Medications  Medication Dose Route Frequency Provider Last Rate Last Dose  . acetaminophen (TYLENOL) tablet 650 mg  650 mg Oral Q4H PRN Glendell Docker, NP      . ibuprofen (ADVIL,MOTRIN) tablet 600 mg  600 mg Oral Q8H PRN Glendell Docker, NP      . ondansetron (ZOFRAN) tablet 4 mg  4 mg Oral Q8H PRN Glendell Docker, NP       Current Outpatient Prescriptions  Medication Sig Dispense Refill  . CALCIUM PO Take 1 tablet by mouth daily.      . Cholecalciferol (VITAMIN D PO) Take 1 tablet by mouth daily.      . Cyanocobalamin (B-12 PO) Take 1 tablet by mouth daily.      Marland Kitchen ibuprofen (ADVIL,MOTRIN) 200 MG tablet Take 400 mg by mouth once as needed.      . Multiple Vitamin (MULTIVITAMIN WITH MINERALS) TABS Take 1 tablet by mouth daily.      Marland Kitchen FLUoxetine (PROZAC) 20 MG tablet Take 20 mg by mouth daily.        Psychiatric Specialty Exam:     Blood pressure 138/75, pulse 81, temperature 98.4  F (36.9 C), temperature source Oral, resp. rate 16, SpO2 97.00%.There is no weight on file to calculate BMI.  General Appearance:  Casual and Fairly Groomed  Eye Contact::  Fair  Speech:  Slow  Volume:  Decreased  Mood:  Anxious and Depressed  Affect:  Constricted, Depressed and Flat  Thought Process:  Disorganized  Orientation:  Full (Time, Place, and Person)  Thought Content:  Delusions and Obsessions  Suicidal Thoughts:  Yes.  without intent/plan  Homicidal Thoughts:  No  Memory:  Immediate;   Fair Recent;   Fair Remote;   Fair  Judgement:  Impaired  Insight:  Lacking  Psychomotor Activity:  Decreased  Concentration:  Poor  Recall:  Fair  Fund of Knowledge:Good  Language: Good  Akathisia:  No  Handed:  Right  AIMS (if indicated):     Assets:  Communication Skills Desire for Improvement Physical Health Social Support Talents/Skills  Sleep:      Musculoskeletal: Strength & Muscle Tone: within normal limits Gait & Station: normal Patient leans: N/A  Treatment Plan Summary: Daily contact with patient to assess and evaluate symptoms and progress in treatment Medication management Will start zyprexa  zydis for delusional thinking and order Brain MRI due to new onset psychosis  ,  11/16/2013 12:38 PM 

## 2013-11-17 ENCOUNTER — Emergency Department (HOSPITAL_COMMUNITY): Payer: 59

## 2013-11-17 ENCOUNTER — Encounter (HOSPITAL_COMMUNITY): Payer: Self-pay | Admitting: Psychiatry

## 2013-11-17 DIAGNOSIS — F29 Unspecified psychosis not due to a substance or known physiological condition: Secondary | ICD-10-CM | POA: Insufficient documentation

## 2013-11-17 DIAGNOSIS — F22 Delusional disorders: Secondary | ICD-10-CM | POA: Diagnosis present

## 2013-11-17 DIAGNOSIS — R45851 Suicidal ideations: Secondary | ICD-10-CM

## 2013-11-17 NOTE — ED Notes (Signed)
Report called to Nunzio Cory at [facility].  Patient aware of pending transfer.  Husband at the bedside and would like to follow the driver over.

## 2013-11-17 NOTE — ED Notes (Signed)
TTS Sharyn Lull called to request an ekg and cxr for [facility].

## 2013-11-17 NOTE — Consult Note (Signed)
[place] Psychiatry Consult   Reason for Consult:  Depressed, delusions Referring Physician:  EDP Carolyn Gutierrez is an 58 y.o. female. Total Time spent with patient: 20 minutes  Assessment: AXIS I:  Major Depression, single episode and Psychotic Disorder NOS AXIS II:  Deferred AXIS III:   Past Medical History  Diagnosis Date  . Allergy   . Migraine   . Stress fracture of right foot   . Asthma     in the past- no inhaler currently   AXIS IV:  occupational problems AXIS V:  21-30 behavior considerably influenced by delusions or hallucinations OR serious impairment in judgment, communication OR inability to function in almost all areas  Plan:  Recommend psychiatric Inpatient admission when medically cleared. Dr. Darleene Cleaver assessed the patient and concurs with the plan.  Subjective:   Carolyn Gutierrez is a 58 y.o. female patient admitted with delusions and depression.  HPI:  Patient remains delusional about fleas being all over her and taking over.  Denies suicidal/homicidal ideations, hallucinations, and drug/alcohol abuse.  She has never had psychiatric treatment or therapy. HPI Elements:   Location:  global. Quality:  severe. Severity:  severe. Timing:  subacute. Duration:  2 months. Context:  work stress.  Past Psychiatric History: Past Medical History  Diagnosis Date  . Allergy   . Migraine   . Stress fracture of right foot   . Asthma     in the past- no inhaler currently    reports that she has never smoked. She does not have any smokeless tobacco history on file. She reports that she drinks alcohol. She reports that she does not use illicit drugs. Family History  Problem Relation Age of Onset  . Cancer Mother     pancreatic  . Cancer Father     basal cell, bladder  . Cancer Sister     melanoma  . Diabetes Sister   . Hypertension Sister   . Arthritis Sister    Family History Family Supports: Yes, List: Living Arrangements: Spouse/significant  other Can pt return to current living arrangement?: Yes Abuse/Neglect [facility]) Physical Abuse: Denies Verbal Abuse: Denies Sexual Abuse: Denies Allergies:  No Known Allergies   Objective: Blood pressure 133/79, pulse 71, temperature 97.8 F (36.6 C), temperature source Oral, resp. rate 16, SpO2 100.00%.There is no weight on file to calculate BMI. Results for orders placed during the hospital encounter of 11/15/13 (from the past 72 hour(s))  ACETAMINOPHEN LEVEL     Status: None   Collection Time    11/15/13  1:22 PM      Result Value Ref Range   Acetaminophen (Tylenol), Serum <15.0  10 - 30 ug/mL   Comment:            THERAPEUTIC CONCENTRATIONS VARY     SIGNIFICANTLY. A RANGE OF 10-30     ug/mL MAY BE AN EFFECTIVE     CONCENTRATION FOR MANY PATIENTS.     HOWEVER, SOME ARE BEST TREATED     AT CONCENTRATIONS OUTSIDE THIS     RANGE.     ACETAMINOPHEN CONCENTRATIONS     >150 ug/mL AT 4 HOURS AFTER     INGESTION AND >50 ug/mL AT 12     HOURS AFTER INGESTION ARE     OFTEN ASSOCIATED WITH TOXIC     REACTIONS.  CBC     Status: Abnormal   Collection Time    11/15/13  1:22 PM      Result Value Ref  Range   WBC 9.4  4.0 - 10.5 K/uL   RBC 4.63  3.87 - 5.11 MIL/uL   Hemoglobin 15.2 (*) 12.0 - 15.0 g/dL   HCT 44.1  36.0 - 46.0 %   MCV 95.2  78.0 - 100.0 fL   MCH 32.8  26.0 - 34.0 pg   MCHC 34.5  30.0 - 36.0 g/dL   RDW 11.5  11.5 - 15.5 %   Platelets 316  150 - 400 K/uL  COMPREHENSIVE METABOLIC PANEL     Status: Abnormal   Collection Time    11/15/13  1:22 PM      Result Value Ref Range   Sodium 136 (*) 137 - 147 mEq/L   Potassium 3.5 (*) 3.7 - 5.3 mEq/L   Chloride 97  96 - 112 mEq/L   CO2 26  19 - 32 mEq/L   Glucose, Bld 116 (*) 70 - 99 mg/dL   BUN 5 (*) 6 - 23 mg/dL   Creatinine, Ser 0.46 (*) 0.50 - 1.10 mg/dL   Calcium 9.7  8.4 - 10.5 mg/dL   Total Protein 7.6  6.0 - 8.3 g/dL   Albumin 4.4  3.5 - 5.2 g/dL   AST 14  0 - 37 U/L   ALT 11  0 - 35 U/L   Alkaline Phosphatase 72   39 - 117 U/L   Total Bilirubin 0.8  0.3 - 1.2 mg/dL   GFR calc non Af Amer >90  >90 mL/min   GFR calc Af Amer >90  >90 mL/min   Comment: (NOTE)     The eGFR has been calculated using the CKD EPI equation.     This calculation has not been validated in all clinical situations.     eGFR's persistently <90 mL/min signify possible Chronic Kidney     Disease.   Anion gap 13  5 - 15  ETHANOL     Status: None   Collection Time    11/15/13  1:22 PM      Result Value Ref Range   Alcohol, Ethyl (B) <11  0 - 11 mg/dL   Comment:            LOWEST DETECTABLE LIMIT FOR     SERUM ALCOHOL IS 11 mg/dL     FOR MEDICAL PURPOSES ONLY  SALICYLATE LEVEL     Status: Abnormal   Collection Time    11/15/13  1:22 PM      Result Value Ref Range   Salicylate Lvl <6.9 (*) 2.8 - 20.0 mg/dL  URINE RAPID DRUG SCREEN (HOSP PERFORMED)     Status: None   Collection Time    11/15/13  2:21 PM      Result Value Ref Range   Opiates NONE DETECTED  NONE DETECTED   Cocaine NONE DETECTED  NONE DETECTED   Benzodiazepines NONE DETECTED  NONE DETECTED   Amphetamines NONE DETECTED  NONE DETECTED   Tetrahydrocannabinol NONE DETECTED  NONE DETECTED   Barbiturates NONE DETECTED  NONE DETECTED   Comment:            DRUG SCREEN FOR MEDICAL PURPOSES     ONLY.  IF CONFIRMATION IS NEEDED     FOR ANY PURPOSE, NOTIFY LAB     WITHIN 5 DAYS.                LOWEST DETECTABLE LIMITS     FOR URINE DRUG SCREEN     Drug Class  Cutoff (ng/mL)     Amphetamine      1000     Barbiturate      200     Benzodiazepine   161     Tricyclics       096     Opiates          300     Cocaine          300     THC              50  URINALYSIS, ROUTINE W REFLEX MICROSCOPIC     Status: Abnormal   Collection Time    11/15/13  2:39 PM      Result Value Ref Range   Color, Urine YELLOW  YELLOW   APPearance CLEAR  CLEAR   Specific Gravity, Urine 1.005  1.005 - 1.030   pH 6.5  5.0 - 8.0   Glucose, UA NEGATIVE  NEGATIVE mg/dL   Hgb urine  dipstick TRACE (*) NEGATIVE   Bilirubin Urine NEGATIVE  NEGATIVE   Ketones, ur 15 (*) NEGATIVE mg/dL   Protein, ur NEGATIVE  NEGATIVE mg/dL   Urobilinogen, UA 0.2  0.0 - 1.0 mg/dL   Nitrite NEGATIVE  NEGATIVE   Leukocytes, UA NEGATIVE  NEGATIVE  URINE MICROSCOPIC-ADD ON     Status: None   Collection Time    11/15/13  2:39 PM      Result Value Ref Range   Squamous Epithelial / LPF RARE  RARE   Labs are reviewed and are pertinent for none.  Current Facility-Administered Medications  Medication Dose Route Frequency Provider Last Rate Last Dose  . acetaminophen (TYLENOL) tablet 650 mg  650 mg Oral Q4H PRN Glendell Docker, NP      . ibuprofen (ADVIL,MOTRIN) tablet 600 mg  600 mg Oral Q8H PRN Glendell Docker, NP      . OLANZapine zydis (ZYPREXA) disintegrating tablet 5 mg  5 mg Oral BID Levonne Spiller, MD   5 mg at 11/17/13 1101  . ondansetron (ZOFRAN) tablet 4 mg  4 mg Oral Q8H PRN Glendell Docker, NP       Current Outpatient Prescriptions  Medication Sig Dispense Refill  . CALCIUM PO Take 1 tablet by mouth daily.      . Cholecalciferol (VITAMIN D PO) Take 1 tablet by mouth daily.      . Cyanocobalamin (B-12 PO) Take 1 tablet by mouth daily.      Marland Kitchen ibuprofen (ADVIL,MOTRIN) 200 MG tablet Take 400 mg by mouth once as needed.      . Multiple Vitamin (MULTIVITAMIN WITH MINERALS) TABS Take 1 tablet by mouth daily.      Marland Kitchen FLUoxetine (PROZAC) 20 MG tablet Take 20 mg by mouth daily.        Psychiatric Specialty Exam:     Blood pressure 133/79, pulse 71, temperature 97.8 F (36.6 C), temperature source Oral, resp. rate 16, SpO2 100.00%.There is no weight on file to calculate BMI.  General Appearance: Casual and Fairly Groomed  Engineer, water::  Fair  Speech:  Slow  Volume:  Decreased  Mood:  Anxious and Depressed  Affect:  Constricted, Depressed and Flat  Thought Process:  Disorganized  Orientation:  Full (Time, Place, and Person)  Thought Content:  Delusions and Obsessions  Suicidal  Thoughts:  Yes.  without intent/plan  Homicidal Thoughts:  No  Memory:  Immediate;   Fair Recent;   Fair Remote;   Fair  Judgement:  Impaired  Insight:  Lacking  Psychomotor Activity:  Decreased  Concentration:  Poor  Recall:  [place] of Knowledge:Good  Language: Good  Akathisia:  No  Handed:  Right  AIMS (if indicated):     Assets:  Communication Skills Desire for Improvement Physical Health Social Support Talents/Skills  Sleep:      Musculoskeletal: Strength & Muscle Tone: within normal limits Gait & Station: normal Patient leans: N/A  Treatment Plan Summary: Daily contact with patient to assess and evaluate symptoms and progress in treatment Medication management; transfer to [facility]ero[facility]psychiatry for stabilization  Waylan Boga, PMH-NP 11/17/2013 12:17 PM  Patient seen, evaluated and I agree with notes by Nurse Practitioner. Corena Pilgrim, MD

## 2013-11-17 NOTE — ED Notes (Signed)
EKG faxed to [facility].

## 2013-11-17 NOTE — ED Notes (Signed)
Patient discharged to [facility].  Left the unit ambulatory with husband and [employer].  Husband will follow the Lucianne Lei there.  All belongings turned over to husband.

## 2013-11-17 NOTE — Progress Notes (Signed)
MHT contacted the following gero-psych facilities for placement:  FAX REFERRALS 1)[place] 2)[facility] 3)[facility] 1)[facility] 2)[facility] 3)[facility]  DECLINED 1)[facility]-not in-network for [employer]; can private pay if chooses  Carolyn Gutierrez, MHT/NS

## 2013-11-17 NOTE — BH Assessment (Signed)
Patient accepted to [facility], per Nunzio Cory. The accepting provider is Dr. Geanie Kenning. Nursing report # is [phone]. Patient will go to [facility] voluntary and has signed a voluntary form consenting to treatment. Pt will be transported to the facility via [place].

## 2013-11-17 NOTE — Progress Notes (Signed)
MHT spoke with [name] at [facility].  Once pt signs a voluntary consent to treat form please fax to [facility] at [phone].  Once received accepting MD and room number will be given.  Pt can come after paperwork completed.  Per [name], the room is already being held for pt.  Carolyn Gutierrez

## 2013-11-21 DIAGNOSIS — R55 Syncope and collapse: Secondary | ICD-10-CM | POA: Insufficient documentation

## 2013-11-22 DIAGNOSIS — I951 Orthostatic hypotension: Secondary | ICD-10-CM | POA: Insufficient documentation

## 2013-12-01 ENCOUNTER — Ambulatory Visit (HOSPITAL_COMMUNITY): Payer: 59 | Admitting: Psychiatry

## 2014-05-11 ENCOUNTER — Other Ambulatory Visit: Payer: Self-pay | Admitting: Obstetrics and Gynecology

## 2014-05-12 LAB — CYTOLOGY - PAP

## 2014-07-08 ENCOUNTER — Encounter: Payer: Self-pay | Admitting: Gastroenterology

## 2014-08-03 ENCOUNTER — Ambulatory Visit (AMBULATORY_SURGERY_CENTER): Payer: Self-pay | Admitting: *Deleted

## 2014-08-03 VITALS — Ht 68.0 in | Wt 169.6 lb

## 2014-08-03 DIAGNOSIS — Z1211 Encounter for screening for malignant neoplasm of colon: Secondary | ICD-10-CM

## 2014-08-03 MED ORDER — MOVIPREP 100 G PO SOLR: 1.0000 | Freq: Once | ORAL | Status: DC

## 2014-08-03 NOTE — Progress Notes (Signed)
No issues with past sedation No egg/soy all;ergy No diet pills No home 02 emmi declined

## 2014-08-17 ENCOUNTER — Encounter: Payer: Self-pay | Admitting: Gastroenterology

## 2014-08-17 ENCOUNTER — Ambulatory Visit (AMBULATORY_SURGERY_CENTER): Payer: 59 | Admitting: Gastroenterology

## 2014-08-17 VITALS — BP 110/54 | HR 59 | Temp 95.8°F | Resp 22 | Ht 68.0 in | Wt 169.0 lb

## 2014-08-17 DIAGNOSIS — Z1211 Encounter for screening for malignant neoplasm of colon: Secondary | ICD-10-CM

## 2014-08-17 MED ORDER — SODIUM CHLORIDE 0.9 % IV SOLN: 500.0000 mL | INTRAVENOUS | Status: DC

## 2014-08-17 NOTE — Patient Instructions (Signed)
Normal colon exam today. Repeat colonoscopy in 10 years. Resume current medications. Call us with any questions or concerns. Thank you!   YOU HAD AN ENDOSCOPIC PROCEDURE TODAY AT THE [facility]:   Refer to the procedure report that was given to you for any specific questions about what was found during the examination.  If the procedure report does not answer your questions, please call your gastroenterologist to clarify.  If you requested that your care partner not be given the details of your procedure findings, then the procedure report has been included in a sealed envelope for you to review at your convenience later.  YOU SHOULD EXPECT: Some feelings of bloating in the abdomen. Passage of more gas than usual.  Walking can help get rid of the air that was put into your GI tract during the procedure and reduce the bloating. If you had a lower endoscopy (such as a colonoscopy or flexible sigmoidoscopy) you may notice spotting of blood in your stool or on the toilet paper. If you underwent a bowel prep for your procedure, you may not have a normal bowel movement for a few days.  Please Note:  You might notice some irritation and congestion in your nose or some drainage.  This is from the oxygen used during your procedure.  There is no need for concern and it should clear up in a day or so.  SYMPTOMS TO REPORT IMMEDIATELY:   Following lower endoscopy (colonoscopy or flexible sigmoidoscopy):  Excessive amounts of blood in the stool  Significant tenderness or worsening of abdominal pains  Swelling of the abdomen that is new, acute  Fever of 100F or higher   For urgent or emergent issues, a gastroenterologist can be reached at any hour by calling [phone].   DIET: Your first meal following the procedure should be a small meal and then it is ok to progress to your normal diet. Heavy or fried foods are harder to digest and may make you feel nauseous or bloated.  Likewise, meals  heavy in dairy and vegetables can increase bloating.  Drink plenty of fluids but you should avoid alcoholic beverages for 24 hours.  ACTIVITY:  You should plan to take it easy for the rest of today and you should NOT DRIVE or use heavy machinery until tomorrow (because of the sedation medicines used during the test).    FOLLOW UP: Our staff will call the number listed on your records the next business day following your procedure to check on you and address any questions or concerns that you may have regarding the information given to you following your procedure. If we do not reach you, we will leave a message.  However, if you are feeling well and you are not experiencing any problems, there is no need to return our call.  We will assume that you have returned to your regular daily activities without incident.  If any biopsies were taken you will be contacted by phone or by letter within the next 1-3 weeks.  Please call us at [phone] if you have not heard about the biopsies in 3 weeks.    SIGNATURES/CONFIDENTIALITY: You and/or your care partner have signed paperwork which will be entered into your electronic medical record.  These signatures attest to the fact that that the information above on your After Visit Summary has been reviewed and is understood.  Full responsibility of the confidentiality of this discharge information lies with you and/or your care-partner. 

## 2014-08-17 NOTE — Progress Notes (Signed)
Report to PACU, RN, vss, BBS= Clear.  

## 2014-08-17 NOTE — Op Note (Signed)
[place]  [employer]. [place], [id number]   COLONOSCOPY PROCEDURE REPORT  PATIENT: Carolyn, Gutierrez  MR#: [id number] BIRTHDATE: [birth year] , 58  yrs. old GENDER: female ENDOSCOPIST: [name], MD REFERRED [id number] Darnelle Going, M.D. PROCEDURE DATE:  08/17/2014 PROCEDURE:   Colonoscopy, screening First Screening Colonoscopy - Avg.  risk and is 50 yrs.  old or older - No.  Prior Negative Screening - Now for repeat screening. 10 or more years since last screening  History of Adenoma - Now for follow-up colonoscopy & has been > or = to 3 yrs.  N/A  Recommend repeat exam, <10 yrs? No ASA CLASS:   Class II INDICATIONS:Screening for colonic neoplasia and Colorectal Neoplasm Risk Assessment for this procedure is average risk. MEDICATIONS: Monitored anesthesia care and Propofol 300 mg IV  DESCRIPTION OF PROCEDURE:   After the risks benefits and alternatives of the procedure were thoroughly explained, informed consent was obtained.  The digital rectal exam revealed no abnormalities of the rectum.   The LB [id number]  endoscope was introduced through the anus and advanced to the cecum, which was identified by both the appendix and ileocecal valve. No adverse events experienced.   The quality of the prep was excellent.  The instrument was then slowly withdrawn as the colon was fully examined. Estimated blood loss is zero unless otherwise noted in this procedure report.   COLON FINDINGS: A normal appearing cecum, ileocecal valve, and appendiceal orifice were identified.  The ascending, transverse, descending, sigmoid colon, and rectum appeared unremarkable. Retroflexed views revealed no abnormalities. The time to cecum = 2.6 Withdrawal time = 7.2   The scope was withdrawn and the procedure completed. COMPLICATIONS: There were no immediate complications.  ENDOSCOPIC IMPRESSION: Normal colonoscopy No polyps or cancers  RECOMMENDATIONS: You should  continue to follow colorectal cancer screening guidelines for "routine risk" patients with a repeat colonoscopy in 10 years.   eSigned:  [name], MD 08/17/2014 9:01 AM

## 2014-08-18 ENCOUNTER — Telehealth: Payer: Self-pay

## 2014-08-18 NOTE — Telephone Encounter (Signed)
Left message on answering machine. 

## 2014-08-25 NOTE — Telephone Encounter (Signed)
Enter in error

## 2015-03-30 ENCOUNTER — Encounter: Payer: Self-pay | Admitting: Podiatry

## 2015-03-30 ENCOUNTER — Ambulatory Visit (INDEPENDENT_AMBULATORY_CARE_PROVIDER_SITE_OTHER): Payer: 59

## 2015-03-30 ENCOUNTER — Ambulatory Visit (INDEPENDENT_AMBULATORY_CARE_PROVIDER_SITE_OTHER): Payer: 59 | Admitting: Podiatry

## 2015-03-30 VITALS — BP 110/66 | HR 62 | Resp 16

## 2015-03-30 DIAGNOSIS — M722 Plantar fascial fibromatosis: Secondary | ICD-10-CM | POA: Diagnosis not present

## 2015-03-30 DIAGNOSIS — G43909 Migraine, unspecified, not intractable, without status migrainosus: Secondary | ICD-10-CM | POA: Insufficient documentation

## 2015-03-30 DIAGNOSIS — J302 Other seasonal allergic rhinitis: Secondary | ICD-10-CM | POA: Insufficient documentation

## 2015-03-30 DIAGNOSIS — J45909 Unspecified asthma, uncomplicated: Secondary | ICD-10-CM | POA: Insufficient documentation

## 2015-03-30 MED ORDER — MELOXICAM 15 MG PO TABS: 15.0000 mg | ORAL_TABLET | Freq: Every day | ORAL | Status: DC

## 2015-03-30 MED ORDER — METHYLPREDNISOLONE 4 MG PO TBPK: ORAL_TABLET | ORAL | Status: DC

## 2015-03-30 NOTE — Patient Instructions (Signed)

## 2015-03-30 NOTE — Progress Notes (Signed)
   Subjective:    Patient ID: Carolyn Gutierrez, female    DOB: 1955/10/07, 60 y.o.   MRN: [id number]  HPI: She presents today with a several month history of pain to the plantar heels bilaterally. States the left hurts worse than the right she has tried ibuprofen and ice therapy only helping the right foot. She denies any trauma to the bilateral heels.  Review of Systems  All other systems reviewed and are negative.      Objective:   Physical Exam: Vital signs are stable she is alert and oriented 3 pulses are palpable. Neurologic sensorium is intact. Deep tendon reflexes are intact bilateral and muscle strength is normal bilateral. Orthopedic evaluation and x-rays all joints distal to the ankle range of motion without crepitation. She has pain on palpation medial calcaneal tubercles bilateral. Radiographs taken today in the office demonstrate plantar distally oriented calcaneal heel spurs with a soft tissue increase in density at the plantar fascial calcaneal insertion site. Cutaneous evaluation of a stress of well-hydrated cutis no open lesions or wounds no signs of infection.        Assessment & Plan:  Plantar fasciitis bilateral left greater than right.  Plan: Injected the bilateral heels today with Kenalog and local anesthetic. Started her on a Medrol Dosepak to be followed by meloxicam. Placed her in bilateral plantar fascial braces and a single night splint. Discussed appropriate shoe gear stretching exercises ice therapy and shoe gear modifications. I will follow-up with her to consider orthotic therapy.

## 2015-03-31 ENCOUNTER — Telehealth: Payer: Self-pay | Admitting: *Deleted

## 2015-03-31 NOTE — Telephone Encounter (Signed)
Pt states was seen by Dr. Milinda Pointer yesterday and was to get an antiinflammatory medication and steroid, but they were not at her pharmacy.  I reviewed pt's medication orders and  Pharmacy confirmation of receipt was 03/30/2015 at 816pm for both medications.  I apologized to the pt for the delay and gave [facility] pharmacy number.

## 2015-04-27 ENCOUNTER — Encounter: Payer: Self-pay | Admitting: Podiatry

## 2015-04-27 ENCOUNTER — Ambulatory Visit (INDEPENDENT_AMBULATORY_CARE_PROVIDER_SITE_OTHER): Payer: 59 | Admitting: Podiatry

## 2015-04-27 VITALS — BP 122/87 | HR 69 | Resp 12

## 2015-04-27 DIAGNOSIS — M722 Plantar fascial fibromatosis: Secondary | ICD-10-CM | POA: Diagnosis not present

## 2015-04-27 NOTE — Progress Notes (Signed)
She presents today for a follow-up of bilateral plantar fasciitis. She states that her right foot is 100% improved her left foot is approximate 60% improved. She continues to use of the plantar fascial release the night splint and the medications.  Objective: Vital signs stable alert and oriented 3. Pulses are palpable. No pain on palpation medial calcaneal tubercle of the right foot. She does have pain on palpation medial calcaneal tubercle of the left foot. No open lesions or wounds pain.  Assessment: Plantar fasciitis right 100% healed plantar fasciitis left 60% improved  Plan: Injected her left heel once again today encouraged her to continue all conservative therapies mentioned above. Continue these for one month after she has reached her goal of 100% improvement. Follow-up with me in 1 month if they goal has not been met.

## 2015-05-05 ENCOUNTER — Telehealth: Payer: Self-pay | Admitting: *Deleted

## 2015-05-05 NOTE — Telephone Encounter (Signed)
Pt states she has received 2 injections for plantar fasciitis, but she now has swelling on the side and top of her foot near the toes.  Pt asked if this could be a separate problem.  I told it sounded like it was different from the plantar fasciitis and she needed to be seen.  Transferred pt to schedulers.

## 2015-05-06 ENCOUNTER — Ambulatory Visit (INDEPENDENT_AMBULATORY_CARE_PROVIDER_SITE_OTHER): Payer: 59

## 2015-05-06 ENCOUNTER — Ambulatory Visit (INDEPENDENT_AMBULATORY_CARE_PROVIDER_SITE_OTHER): Payer: 59 | Admitting: Podiatry

## 2015-05-06 ENCOUNTER — Encounter: Payer: Self-pay | Admitting: Podiatry

## 2015-05-06 VITALS — BP 118/70 | HR 54 | Resp 12

## 2015-05-06 DIAGNOSIS — S92302A Fracture of unspecified metatarsal bone(s), left foot, initial encounter for closed fracture: Secondary | ICD-10-CM

## 2015-05-06 DIAGNOSIS — R609 Edema, unspecified: Secondary | ICD-10-CM | POA: Diagnosis not present

## 2015-05-06 NOTE — Progress Notes (Signed)
   Subjective:    Patient ID: Carolyn Gutierrez, female    DOB: 05/16/1955, 60 y.o.   MRN: [id number]  HPI: She presents today with a one-week duration of pain to the dorsal lateral aspect of the left foot. Denies any trauma or history.    Review of Systems  Musculoskeletal: Positive for joint swelling.       Objective:   Physical Exam: Vital signs are stable alert and oriented 3. Pulses are palpable. Neurologic sensorium is intact. Degenerative flexor intact. Muscle strength 1 through 5 bilateral is noted. She has swelling and edema overlying the fourth metatarsal of the left foot. Radiographs confirm what appears to be in mid diaphyseal nondisplaced comminuted fracture fourth metatarsal left foot.        Assessment & Plan:  Assessment: Fracture fourth metatarsal left foot.  Plan: Placed her in a Cam Walker follow-up with her in 1 month

## 2015-05-07 ENCOUNTER — Telehealth: Payer: Self-pay | Admitting: *Deleted

## 2015-05-07 NOTE — Telephone Encounter (Signed)
Pt states Dr. Milinda Pointer diagnosed her with a stress fracture yesterday and today when getting out of the tub she heard a pop and now the foot hurts and has a different sensation.  I spoke with Dr. Jacqualyn Posey and he stated if pt is in the cam walker, she should stay in it, and she may have an appt today with him or make an appt to see Dr. Milinda Pointer next week.  Pt states she will stay in the cam walker and see Dr. Milinda Pointer next week.  Transferred to schedulers.

## 2015-05-10 ENCOUNTER — Telehealth: Payer: Self-pay | Admitting: *Deleted

## 2015-05-10 NOTE — Telephone Encounter (Signed)
Patient calls stating that she was in last Thurs in the [place] office for fracture. She woke up Friday and felt a pop and a lot of pain. Some better today but concerned she did more damage. Does she need to return sooner than scheduled appt?

## 2015-05-11 ENCOUNTER — Ambulatory Visit (INDEPENDENT_AMBULATORY_CARE_PROVIDER_SITE_OTHER): Payer: 59 | Admitting: Podiatry

## 2015-05-11 ENCOUNTER — Encounter: Payer: Self-pay | Admitting: Podiatry

## 2015-05-11 ENCOUNTER — Ambulatory Visit (INDEPENDENT_AMBULATORY_CARE_PROVIDER_SITE_OTHER): Payer: 59

## 2015-05-11 VITALS — BP 131/81 | HR 86 | Resp 16

## 2015-05-11 DIAGNOSIS — S92302D Fracture of unspecified metatarsal bone(s), left foot, subsequent encounter for fracture with routine healing: Secondary | ICD-10-CM | POA: Diagnosis not present

## 2015-05-11 NOTE — Telephone Encounter (Signed)
Patient is scheduled to see Dr Milinda Pointer this afternoon.

## 2015-05-11 NOTE — Progress Notes (Signed)
She presents today with a chief complaint of pain to the plantar and plantar lateral aspect of the left foot after presenting last week with pain to the dorsal lateral aspect of the left foot. She states that she heard a pop in her foot and then she experienced excruciating pain.  Objective: Vital signs are stable she is alert and oriented 3 moderate edema dorsal lateral aspect of the left foot no reproducible pain on palpation of the plantar surface. Radiographs do not demonstrate a through and through fracture. I see no signs of additional fracture third or fourth metatarsals left foot.  Assessment fracture third fourth metatarsal left foot.  Plan: I encouraged her to continue to use the Cam Walker and follow-up with me at her regular scheduled appointment. Radiographs will be taken at that time.

## 2015-05-27 ENCOUNTER — Ambulatory Visit (INDEPENDENT_AMBULATORY_CARE_PROVIDER_SITE_OTHER): Payer: 59 | Admitting: Podiatry

## 2015-05-27 ENCOUNTER — Encounter: Payer: Self-pay | Admitting: Podiatry

## 2015-05-27 ENCOUNTER — Ambulatory Visit (INDEPENDENT_AMBULATORY_CARE_PROVIDER_SITE_OTHER): Payer: 59

## 2015-05-27 VITALS — BP 119/70 | HR 71 | Resp 16

## 2015-05-27 DIAGNOSIS — S92302D Fracture of unspecified metatarsal bone(s), left foot, subsequent encounter for fracture with routine healing: Secondary | ICD-10-CM | POA: Diagnosis not present

## 2015-05-29 NOTE — Progress Notes (Signed)
She presents today after wearing a Cam Walker to the left foot 1 month. She states that the foot is still sore and tender to the touch. Last time she was in I thought she had a intraosseous or a medullary fracture of the third metatarsal of the left foot.  Objective: Vital signs are stable alert and oriented 3. She has pain on palpation third metatarsal mid diaphyseal region left foot. Radiographs taken today in the office confirm there has been a fracture in that area with the new bone callus formation.  Assessment: Fracture third metatarsal left foot.  Plan: Encouraged her to continue to wear the Cam Walker for another few weeks. Follow-up with her in 4 weeks.

## 2015-06-04 ENCOUNTER — Telehealth: Payer: Self-pay | Admitting: Internal Medicine

## 2015-06-04 DIAGNOSIS — E2839 Other primary ovarian failure: Secondary | ICD-10-CM

## 2015-06-04 DIAGNOSIS — Z8781 Personal history of (healed) traumatic fracture: Secondary | ICD-10-CM

## 2015-06-04 NOTE — Telephone Encounter (Signed)
?   Is this because of  Fracture foot?  If so can order dexa and  Then OV   As we will need to do lab work after discussion . I have not seen her in 3 years .

## 2015-06-04 NOTE — Telephone Encounter (Signed)
Pt would like to have a DEXA scan referral per her podiatry.

## 2015-06-08 NOTE — Telephone Encounter (Signed)
Spoke to the pt.  Advised that she will need a follow up after she gets her dexa scan date.  Will need 30 minutes.  She will call back to make appt when she gets date.

## 2015-06-21 ENCOUNTER — Ambulatory Visit (INDEPENDENT_AMBULATORY_CARE_PROVIDER_SITE_OTHER)
Admission: RE | Admit: 2015-06-21 | Discharge: 2015-06-21 | Disposition: A | Discharge status: Home / Self Care | Payer: 59 | Source: Ambulatory Visit | Attending: Internal Medicine | Admitting: Internal Medicine

## 2015-06-21 DIAGNOSIS — Z8781 Personal history of (healed) traumatic fracture: Secondary | ICD-10-CM

## 2015-06-21 DIAGNOSIS — E2839 Other primary ovarian failure: Secondary | ICD-10-CM | POA: Diagnosis not present

## 2015-06-24 ENCOUNTER — Ambulatory Visit (INDEPENDENT_AMBULATORY_CARE_PROVIDER_SITE_OTHER): Payer: 59

## 2015-06-24 ENCOUNTER — Encounter: Payer: Self-pay | Admitting: Podiatry

## 2015-06-24 ENCOUNTER — Ambulatory Visit (INDEPENDENT_AMBULATORY_CARE_PROVIDER_SITE_OTHER): Payer: 59 | Admitting: Podiatry

## 2015-06-24 VITALS — BP 111/58 | HR 77 | Resp 16

## 2015-06-24 DIAGNOSIS — S92302D Fracture of unspecified metatarsal bone(s), left foot, subsequent encounter for fracture with routine healing: Secondary | ICD-10-CM | POA: Diagnosis not present

## 2015-06-24 NOTE — Progress Notes (Signed)
She presents today for follow-up of her fracture third metatarsal left foot. She states this seems to be doing some better however there is still considerable swelling. She would like me to look at her shoes that she brought with her today Which one she can wear based on the density.  Objective: Vital signs are stable she is alert and oriented 3. She still has moderate edema overlying the dorsal midfoot. She still has pain on direct palpation mid diaphyseal region of the left foot. Radius taken today demonstrate a nice bone callus forming mid diaphyseal region of the left foot with minimal dislocation or deviation of the metatarsal and its distal fragment.  Assessment: Healing fracture third metatarsal left foot.  Plan: We discussed appropriate shoes and exercises today. I recommended that she continue to wear the Cam Walker/Darco shoe for the next month. Follow-up with me as needed.

## 2015-07-07 ENCOUNTER — Ambulatory Visit (INDEPENDENT_AMBULATORY_CARE_PROVIDER_SITE_OTHER): Payer: 59 | Admitting: Internal Medicine

## 2015-07-07 ENCOUNTER — Encounter: Payer: Self-pay | Admitting: Internal Medicine

## 2015-07-07 VITALS — BP 110/62 | HR 98 | Temp 98.2°F | Ht 68.0 in | Wt 156.0 lb

## 2015-07-07 DIAGNOSIS — Z8262 Family history of osteoporosis: Secondary | ICD-10-CM

## 2015-07-07 DIAGNOSIS — Z8781 Personal history of (healed) traumatic fracture: Secondary | ICD-10-CM

## 2015-07-07 DIAGNOSIS — M8580 Other specified disorders of bone density and structure, unspecified site: Secondary | ICD-10-CM

## 2015-07-07 NOTE — Progress Notes (Signed)
Chief Complaint  Patient presents with  . Follow-up    HPI: Carolyn Gutierrez 60 y.o.  comin in in regard to bone dnity hx of foot fracture  Now healing  Boot off  fam hx sis and mom osteoporosis and thyroid disease. Feels ok  Well otherwise . Get lower rib pain off and on not debilitating and no injury  Not taking vit d supplements  ROS: See pertinent positives and negatives per HPI. No cp sob  Skin changes   Past Medical History  Diagnosis Date  . Allergy   . Migraine   . Stress fracture of right foot   . Asthma     in the past- no inhaler currently  . Anxiety   . Depression     Family History  Problem Relation Age of Onset  . Cancer Mother     pancreatic  . Pancreatic cancer Mother   . Cancer Father     basal cell, bladder  . Heart disease Father   . Cancer Sister     melanoma  . Diabetes Sister   . Hypertension Sister   . Arthritis Sister   . Colon cancer Neg Hx   . Colon polyps Neg Hx   . Esophageal cancer Neg Hx   . Rectal cancer Neg Hx   . Stomach cancer Neg Hx     Social History   Social History  . Marital Status: Married    Spouse Name: N/A  . Number of Children: N/A  . Years of Education: N/A   Social History Main Topics  . Smoking status: Never Smoker   . Smokeless tobacco: Never Used  . Alcohol Use: 0.0 oz/week    0 Standard drinks or equivalent per week     Comment: beer daily-weekends only about 4 / weekend  . Drug Use: No  . Sexual Activity: Not Asked   Other Topics Concern  . None   Social History Narrative   hh of 2   2 cats   No ets   Is a vegan - taking biotin    Outpatient Prescriptions Prior to Visit  Medication Sig Dispense Refill  . CALCIUM PO Take 1 tablet by mouth daily.    . Cholecalciferol (VITAMIN D PO) Take 1 tablet by mouth daily.    . Cyanocobalamin (B-12 PO) Take 1 tablet by mouth daily.    . Multiple Vitamin (MULTIVITAMIN WITH MINERALS) TABS Take 1 tablet by mouth daily.    . sertraline (ZOLOFT) 50 MG  tablet     . VITAMIN K PO Take by mouth daily.    Marland Kitchen ibuprofen (ADVIL,MOTRIN) 200 MG tablet Take 400 mg by mouth once as needed.    . meloxicam (MOBIC) 15 MG tablet Take 1 tablet (15 mg total) by mouth daily. 30 tablet 3   No facility-administered medications prior to visit.     EXAM:  BP 110/62 mmHg  Pulse 98  Temp(Src) 98.2 F (36.8 C) (Oral)  Ht 5\' 8"  (1.727 m)  Wt 156 lb (70.761 kg)  BMI 23.73 kg/m2  SpO2 98%  Body mass index is 23.73 kg/(m^2).  GENERAL: vitals reviewed and listed above, alert, oriented, appears well hydrated and in no acute distress HEENT: atraumatic, conjunctiva  clear, no obvious abnormalities on inspection of external nose and ears  NECK: no obvious masses on inspection palpation  LUNGS: clear to auscultation bilaterally, no wheezes, rales or rhonchi, good air movement CV: HRRR, no clubbing cyanosis or  peripheral edema  nl cap refill  Abdomen:  Sof,t normal bowel sounds without hepatosplenomegaly, no guarding rebound or masses no CVA tenderness MS: moves all extremities without noticeable focal  abnormality PSYCH: pleasant and cooperative, no obvious depression or anxiety t score sp -0.4 lfn- 1.1    ASSESSMENT AND PLAN:  Discussed the following assessment and plan:  Osteopenia - Plan: TSH, T4, free, Basic metabolic panel, CBC with Differential/Platelet, Hepatic function panel, VITAMIN D 25 Hydroxy (Vit-D Deficiency, Fractures), PTH, intact and calcium, Prolactin  History of fracture - Plan: TSH, T4, free, Basic metabolic panel, CBC with Differential/Platelet, Hepatic function panel, VITAMIN D 25 Hydroxy (Vit-D Deficiency, Fractures), PTH, intact and calcium, Prolactin  Family hx osteoporosis - mom and sis   -Patient advised to return or notify health care team  if symptoms worsen ,persist or new concerns arise.  Patient Instructions   Will notify you  of labs when available. Looking for  causes of low bone density   If all ok then exercise  increase as per  Your foot specialist  Bone Health Bones protect organs, store calcium, and anchor muscles. Good health habits, such as eating nutritious foods and exercising regularly, are important for maintaining healthy bones. They can also help to prevent a condition that causes bones to lose density and become weak and brittle (osteoporosis). WHY IS BONE MASS IMPORTANT? Bone mass refers to the amount of bone tissue that you have. The higher your bone mass, the stronger your bones. An important step toward having healthy bones throughout life is to have strong and dense bones during childhood. A young adult who has a high bone mass is more likely to have a high bone mass later in life. Bone mass at its greatest it is called peak bone mass. A large decline in bone mass occurs in older adults. In women, it occurs about the time of menopause. During this time, it is important to practice good health habits, because if more bone is lost than what is replaced, the bones will become less healthy and more likely to break (fracture). If you find that you have a low bone mass, you may be able to prevent osteoporosis or further bone loss by changing your diet and lifestyle. HOW CAN I FIND OUT IF MY BONE MASS IS LOW? Bone mass can be measured with an X-ray test that is called a bone mineral density (BMD) test. This test is recommended for all women who are age 35 or older. It may also be recommended for men who are age 16 or older, or for people who are more likely to develop osteoporosis due to:  Having bones that break easily.  Having a long-term disease that weakens bones, such as kidney disease or rheumatoid arthritis.  Having menopause earlier than normal.  Taking medicine that weakens bones, such as steroids, thyroid hormones, or hormone treatment for breast cancer or prostate cancer.  Smoking.  Drinking three or more alcoholic drinks each day. WHAT ARE THE NUTRITIONAL RECOMMENDATIONS FOR  HEALTHY BONES? To have healthy bones, you need to get enough of the right minerals and vitamins. Most nutrition experts recommend getting these nutrients from the foods that you eat. Nutritional recommendations vary from person to person. Ask your health care provider what is healthy for you. Here are some general guidelines. Calcium Recommendations Calcium is the most important (essential) mineral for bone health. Most people can get enough calcium from their diet, but supplements may be recommended for people who are at risk for osteoporosis.  Good sources of calcium include:  Dairy products, such as low-fat or nonfat milk, cheese, and yogurt.  Dark green leafy vegetables, such as bok choy and broccoli.  Calcium-fortified foods, such as orange juice, cereal, bread, soy beverages, and tofu products.  Nuts, such as almonds. Follow these recommended amounts for daily calcium intake:  Children, age 59-3: 700 mg.  Children, age 63-8: 1,000 mg.  Children, age 64-13: 1,300 mg.  Teens, age 96-18: 1,300 mg.  Adults, age 592-50: 1,000 mg.  Adults, age 20-70:  Men: 1,000 mg.  Women: 1,200 mg.  Adults, age 634 or older: 1,200 mg.  Pregnant and breastfeeding females:  Teens: 1,300 mg.  Adults: 1,000 mg. Vitamin D Recommendations Vitamin D is the most essential vitamin for bone health. It helps the body to absorb calcium. Sunlight stimulates the skin to make vitamin D, so be sure to get enough sunlight. If you live in a cold climate or you do not get outside often, your health care provider may recommend that you take vitamin D supplements. Good sources of vitamin D in your diet include:  Egg yolks.  Saltwater fish.  Milk and cereal fortified with vitamin D. Follow these recommended amounts for daily vitamin D intake:  Children and teens, age 59-18: 600 international units.  Adults, age 31 or younger: 400-800 international units.  Adults, age 78 or older: 800-1,000 international  units. Other Nutrients Other nutrients for bone health include:  Phosphorus. This mineral is found in meat, poultry, dairy foods, nuts, and legumes. The recommended daily intake for adult men and adult women is 700 mg.  Magnesium. This mineral is found in seeds, nuts, dark green vegetables, and legumes. The recommended daily intake for adult men is 400-420 mg. For adult women, it is 310-320 mg.  Vitamin K. This vitamin is found in green leafy vegetables. The recommended daily intake is 120 mg for adult men and 90 mg for adult women. WHAT TYPE OF PHYSICAL ACTIVITY IS BEST FOR BUILDING AND MAINTAINING HEALTHY BONES? Weight-bearing and strength-building activities are important for building and maintaining peak bone mass. Weight-bearing activities cause muscles and bones to work against gravity. Strength-building activities increases muscle strength that supports bones. Weight-bearing and muscle-building activities include:  Walking and hiking.  Jogging and running.  Dancing.  Gym exercises.  Lifting weights.  Tennis and racquetball.  Climbing stairs.  Aerobics. Adults should get at least 30 minutes of moderate physical activity on most days. Children should get at least 60 minutes of moderate physical activity on most days. Ask your health care provide what type of exercise is best for you. WHERE CAN I FIND MORE INFORMATION? For more information, check out the following websites:  [place]: [employer].se  [employer] of Health: http://www.niams.AnonymousEar.fr.asp   This information is not intended to replace advice given to you by your health care provider. Make sure you discuss any questions you have with your health care provider.   Document Released: 04/15/2003 Document Revised: 06/09/2014 Document Reviewed: 01/28/2014 Elsevier Interactive Patient Education 2016 [place] K. Panosh M.D.

## 2015-07-07 NOTE — Progress Notes (Signed)
Pre visit review using our clinic review tool, if applicable. No additional management support is needed unless otherwise documented below in the visit note. 

## 2015-07-07 NOTE — Patient Instructions (Addendum)
Will notify you  of labs when available. Looking for  causes of low bone density   If all ok then exercise increase as per  Your foot specialist  Bone Health Bones protect organs, store calcium, and anchor muscles. Good health habits, such as eating nutritious foods and exercising regularly, are important for maintaining healthy bones. They can also help to prevent a condition that causes bones to lose density and become weak and brittle (osteoporosis). WHY IS BONE MASS IMPORTANT? Bone mass refers to the amount of bone tissue that you have. The higher your bone mass, the stronger your bones. An important step toward having healthy bones throughout life is to have strong and dense bones during childhood. A young adult who has a high bone mass is more likely to have a high bone mass later in life. Bone mass at its greatest it is called peak bone mass. A large decline in bone mass occurs in older adults. In women, it occurs about the time of menopause. During this time, it is important to practice good health habits, because if more bone is lost than what is replaced, the bones will become less healthy and more likely to break (fracture). If you find that you have a low bone mass, you may be able to prevent osteoporosis or further bone loss by changing your diet and lifestyle. HOW CAN I FIND OUT IF MY BONE MASS IS LOW? Bone mass can be measured with an X-ray test that is called a bone mineral density (BMD) test. This test is recommended for all women who are age 64 or older. It may also be recommended for men who are age 20 or older, or for people who are more likely to develop osteoporosis due to:  Having bones that break easily.  Having a long-term disease that weakens bones, such as kidney disease or rheumatoid arthritis.  Having menopause earlier than normal.  Taking medicine that weakens bones, such as steroids, thyroid hormones, or hormone treatment for breast cancer or prostate  cancer.  Smoking.  Drinking three or more alcoholic drinks each day. WHAT ARE THE NUTRITIONAL RECOMMENDATIONS FOR HEALTHY BONES? To have healthy bones, you need to get enough of the right minerals and vitamins. Most nutrition experts recommend getting these nutrients from the foods that you eat. Nutritional recommendations vary from person to person. Ask your health care provider what is healthy for you. Here are some general guidelines. Calcium Recommendations Calcium is the most important (essential) mineral for bone health. Most people can get enough calcium from their diet, but supplements may be recommended for people who are at risk for osteoporosis. Good sources of calcium include:  Dairy products, such as low-fat or nonfat milk, cheese, and yogurt.  Dark green leafy vegetables, such as bok choy and broccoli.  Calcium-fortified foods, such as orange juice, cereal, bread, soy beverages, and tofu products.  Nuts, such as almonds. Follow these recommended amounts for daily calcium intake:  Children, age 89-3: 700 mg.  Children, age 8-8: 1,000 mg.  Children, age 890-13: 1,300 mg.  Teens, age 86-18: 1,300 mg.  Adults, age 33-50: 1,000 mg.  Adults, age 70-70:  Men: 1,000 mg.  Women: 1,200 mg.  Adults, age 86 or older: 1,200 mg.  Pregnant and breastfeeding females:  Teens: 1,300 mg.  Adults: 1,000 mg. Vitamin D Recommendations Vitamin D is the most essential vitamin for bone health. It helps the body to absorb calcium. Sunlight stimulates the skin to make vitamin D, so be sure  to get enough sunlight. If you live in a cold climate or you do not get outside often, your health care provider may recommend that you take vitamin D supplements. Good sources of vitamin D in your diet include:  Egg yolks.  Saltwater fish.  Milk and cereal fortified with vitamin D. Follow these recommended amounts for daily vitamin D intake:  Children and teens, age 99-18: 600 international  units.  Adults, age 36 or younger: 400-800 international units.  Adults, age 51 or older: 800-1,000 international units. Other Nutrients Other nutrients for bone health include:  Phosphorus. This mineral is found in meat, poultry, dairy foods, nuts, and legumes. The recommended daily intake for adult men and adult women is 700 mg.  Magnesium. This mineral is found in seeds, nuts, dark green vegetables, and legumes. The recommended daily intake for adult men is 400-420 mg. For adult women, it is 310-320 mg.  Vitamin K. This vitamin is found in green leafy vegetables. The recommended daily intake is 120 mg for adult men and 90 mg for adult women. WHAT TYPE OF PHYSICAL ACTIVITY IS BEST FOR BUILDING AND MAINTAINING HEALTHY BONES? Weight-bearing and strength-building activities are important for building and maintaining peak bone mass. Weight-bearing activities cause muscles and bones to work against gravity. Strength-building activities increases muscle strength that supports bones. Weight-bearing and muscle-building activities include:  Walking and hiking.  Jogging and running.  Dancing.  Gym exercises.  Lifting weights.  Tennis and racquetball.  Climbing stairs.  Aerobics. Adults should get at least 30 minutes of moderate physical activity on most days. Children should get at least 60 minutes of moderate physical activity on most days. Ask your health care provide what type of exercise is best for you. WHERE CAN I FIND MORE INFORMATION? For more information, check out the following websites:  [place]: YardHomes.se  [employer] of Health: http://www.niams.AnonymousEar.fr.asp   This information is not intended to replace advice given to you by your health care provider. Make sure you discuss any questions you have with your health care provider.   Document Released: 04/15/2003 Document  Revised: 06/09/2014 Document Reviewed: 01/28/2014 Elsevier Interactive Patient Education [employer].

## 2015-07-08 LAB — TSH: TSH: 1.33 u[IU]/mL (ref 0.35–4.50)

## 2015-07-08 LAB — CBC WITH DIFFERENTIAL/PLATELET
BASOS ABS: 0 10*3/uL (ref 0.0–0.1)
Basophils Relative: 0.3 % (ref 0.0–3.0)
EOS ABS: 0.1 10*3/uL (ref 0.0–0.7)
Eosinophils Relative: 0.7 % (ref 0.0–5.0)
HCT: 40.1 % (ref 36.0–46.0)
Hemoglobin: 13.8 g/dL (ref 12.0–15.0)
LYMPHS ABS: 1.5 10*3/uL (ref 0.7–4.0)
Lymphocytes Relative: 17.4 % (ref 12.0–46.0)
MCHC: 34.4 g/dL (ref 30.0–36.0)
MCV: 94.7 fl (ref 78.0–100.0)
MONO ABS: 0.4 10*3/uL (ref 0.1–1.0)
MONOS PCT: 4.9 % (ref 3.0–12.0)
NEUTROS ABS: 6.6 10*3/uL (ref 1.4–7.7)
NEUTROS PCT: 76.7 % (ref 43.0–77.0)
PLATELETS: 268 10*3/uL (ref 150.0–400.0)
RBC: 4.24 Mil/uL (ref 3.87–5.11)
RDW: 12 % (ref 11.5–15.5)
WBC: 8.5 10*3/uL (ref 4.0–10.5)

## 2015-07-08 LAB — BASIC METABOLIC PANEL
BUN: 9 mg/dL (ref 6–23)
CALCIUM: 9.6 mg/dL (ref 8.4–10.5)
CO2: 30 meq/L (ref 19–32)
CREATININE: 0.54 mg/dL (ref 0.40–1.20)
Chloride: 102 mEq/L (ref 96–112)
GFR: 121.7 mL/min (ref >60.00)
GLUCOSE: 93 mg/dL (ref 70–99)
Potassium: 3.9 mEq/L (ref 3.5–5.1)
SODIUM: 140 meq/L (ref 135–145)

## 2015-07-08 LAB — PTH, INTACT AND CALCIUM
CALCIUM: 9.1 mg/dL (ref 8.4–10.5)
PTH: 62 pg/mL (ref 14–64)

## 2015-07-08 LAB — HEPATIC FUNCTION PANEL
ALBUMIN: 4.4 g/dL (ref 3.5–5.2)
ALK PHOS: 71 U/L (ref 39–117)
ALT: 9 U/L (ref 0–35)
AST: 13 U/L (ref 0–37)
BILIRUBIN TOTAL: 0.5 mg/dL (ref 0.2–1.2)
Bilirubin, Direct: 0.1 mg/dL (ref 0.0–0.3)
Total Protein: 7 g/dL (ref 6.0–8.3)

## 2015-07-08 LAB — T4, FREE: Free T4: 0.84 ng/dL (ref 0.60–1.60)

## 2015-07-08 LAB — PROLACTIN: PROLACTIN: 5.7 ng/mL

## 2015-07-08 LAB — VITAMIN D 25 HYDROXY (VIT D DEFICIENCY, FRACTURES): VITD: 32.07 ng/mL (ref 30.00–100.00)

## 2015-08-02 ENCOUNTER — Telehealth: Payer: Self-pay | Admitting: *Deleted

## 2015-08-02 NOTE — Telephone Encounter (Signed)
Pt states she is still having a lot of pain and swelling in the foot she has 2 fractured metatarsal in, wonders if there could be other fractures.  I told pt it has been over 4 weeks since last OV, I would like to get her in for reevaluation of fracture sites, due to continued pain.  Transferred pt to schedulers.

## 2015-08-05 ENCOUNTER — Ambulatory Visit: Payer: 59 | Admitting: Podiatry

## 2015-08-26 ENCOUNTER — Ambulatory Visit: Payer: 59 | Admitting: Podiatry

## 2015-12-14 ENCOUNTER — Encounter: Payer: Self-pay | Admitting: Podiatry

## 2015-12-14 ENCOUNTER — Ambulatory Visit (INDEPENDENT_AMBULATORY_CARE_PROVIDER_SITE_OTHER): Payer: 59 | Admitting: Podiatry

## 2015-12-14 ENCOUNTER — Ambulatory Visit (INDEPENDENT_AMBULATORY_CARE_PROVIDER_SITE_OTHER): Payer: 59

## 2015-12-14 DIAGNOSIS — M7752 Other enthesopathy of left foot: Secondary | ICD-10-CM

## 2015-12-14 DIAGNOSIS — M71572 Other bursitis, not elsewhere classified, left ankle and foot: Secondary | ICD-10-CM | POA: Diagnosis not present

## 2015-12-14 DIAGNOSIS — S92302D Fracture of unspecified metatarsal bone(s), left foot, subsequent encounter for fracture with routine healing: Secondary | ICD-10-CM

## 2015-12-14 DIAGNOSIS — S92335D Nondisplaced fracture of third metatarsal bone, left foot, subsequent encounter for fracture with routine healing: Secondary | ICD-10-CM | POA: Diagnosis not present

## 2015-12-15 NOTE — Progress Notes (Signed)
She presents today for follow-up of a mid diaphyseal fracture to the third metatarsal of the left foot. She states that her foot is still hurting. She states that it does not hurt overlying the third metatarsal however hurts laterally. She's also noticed some increased pain along the plantar fascia area she points to the plantar medial aspect of the left heel. She is also having the majority of her pain overlying the fifth metatarsal base of the left foot with mild erythema in this area. She's noticed swelling in that area.  Objective: Vital signs are stable alert and oriented 3 no erythema or edema cellulitis drainage or odor. Pulses are palpable. She has an overlying bogginess of the fifth metatarsal base left. She also has some tenderness on palpation making a typical left. Some tenderness on palpation posterior tibial tendon left. Radiographs taken today demonstrate mild increase in density of the plantar fascia calcaneal insertion site with increase in soft tissue overlying the fifth metatarsal base without signs of fracture.  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 See no signs of fracture.  Plan: I injected the bursa today or in the area where the bursa should be overlying the fifth metatarsal base with dexamethasone and local anesthetic. Hopefully this will alleviate her symptoms if it does not then we will attack the plantar fascia next visit.

## 2016-01-11 ENCOUNTER — Ambulatory Visit (INDEPENDENT_AMBULATORY_CARE_PROVIDER_SITE_OTHER): Payer: 59 | Admitting: Podiatry

## 2016-01-11 ENCOUNTER — Encounter: Payer: Self-pay | Admitting: Podiatry

## 2016-01-11 ENCOUNTER — Ambulatory Visit (INDEPENDENT_AMBULATORY_CARE_PROVIDER_SITE_OTHER): Payer: 59

## 2016-01-11 DIAGNOSIS — M71572 Other bursitis, not elsewhere classified, left ankle and foot: Secondary | ICD-10-CM

## 2016-01-11 DIAGNOSIS — M7752 Other enthesopathy of left foot: Secondary | ICD-10-CM

## 2016-01-11 DIAGNOSIS — S92335D Nondisplaced fracture of third metatarsal bone, left foot, subsequent encounter for fracture with routine healing: Secondary | ICD-10-CM

## 2016-01-12 NOTE — Progress Notes (Signed)
She presents today for follow up of her fracture to the dorsal aspect of the left foot. She states that part feels much better however she is experiencing pain to the lateral aspect of the fifth metatarsal base.   Objective: Vital signs are stable alert and oriented x3. No pain on palpation dorsal aspect of the left foot and radiographs 3 views taken in the office today demonstrates no cortical interruption to the third metatarsal left foot. No fractures are identified to the fifth metatarsal base but there is soft tissue swelling present. She has pain on palpation but metatarsal base with an area of fluctuance beneath the fifth met.  Assessment:Well-healing mid diaphyseal fracture third metatarsal left foot. Bursitis metatarsal base left foot. Paragraph plan: I injected the area today with dexamethasone and local anesthetic. I will followup with her in one month and consider an MRI at that point if she is not improved. He

## 2016-02-08 ENCOUNTER — Encounter: Payer: Self-pay | Admitting: Podiatry

## 2016-02-08 ENCOUNTER — Ambulatory Visit (INDEPENDENT_AMBULATORY_CARE_PROVIDER_SITE_OTHER): Payer: 59

## 2016-02-08 ENCOUNTER — Telehealth: Payer: Self-pay | Admitting: *Deleted

## 2016-02-08 ENCOUNTER — Ambulatory Visit (INDEPENDENT_AMBULATORY_CARE_PROVIDER_SITE_OTHER): Payer: 59 | Admitting: Podiatry

## 2016-02-08 DIAGNOSIS — M71572 Other bursitis, not elsewhere classified, left ankle and foot: Secondary | ICD-10-CM | POA: Diagnosis not present

## 2016-02-08 DIAGNOSIS — M7672 Peroneal tendinitis, left leg: Secondary | ICD-10-CM | POA: Diagnosis not present

## 2016-02-08 DIAGNOSIS — S92335D Nondisplaced fracture of third metatarsal bone, left foot, subsequent encounter for fracture with routine healing: Secondary | ICD-10-CM

## 2016-02-08 DIAGNOSIS — M7752 Other enthesopathy of left foot: Secondary | ICD-10-CM

## 2016-02-08 NOTE — Progress Notes (Signed)
She presents today for follow-up of her painful fifth metatarsal diaphyseal fracture which is gone to heal however now she is experiencing pain to the fourth and fifth metatarsal base area. She has a large amount of swelling overlying the dorsal lateral aspect in the plantar lateral aspect of the fifth metatarsal base which is painful for her.  Objective: Vital signs are stable she's alert and oriented 3 she has pain on palpation of the peroneal brevis tendon as it inserts on the fifth metatarsal base of the left foot. Radiographs do not demonstrate an active fracture at this point fifth metatarsal base left. I see no fractures of the fourth metatarsal base.  Assessment: Pain surrounding the fifth metatarsal base which is chronic in nature and all conservative therapies have failed.  Plan: MRI is requested this point evaluating for a peroneal tendon tear and/or bursitis of the fifth metatarsal base. This is a surgical indication.

## 2016-02-08 NOTE — Telephone Encounter (Addendum)
-----   Message from [place] sent at 02/08/2016  8:35 AM EST ----- Regarding: MRI Orders are in! Thanks! Faxed orders to [facility] and gave to D. [place] for [employer].

## 2016-02-11 ENCOUNTER — Ambulatory Visit
Admission: RE | Admit: 2016-02-11 | Discharge: 2016-02-11 | Disposition: A | Discharge status: Home / Self Care | Payer: 59 | Source: Ambulatory Visit | Attending: Podiatry | Admitting: Podiatry

## 2016-02-11 DIAGNOSIS — M79672 Pain in left foot: Secondary | ICD-10-CM | POA: Diagnosis not present

## 2016-02-11 DIAGNOSIS — M7672 Peroneal tendinitis, left leg: Secondary | ICD-10-CM

## 2016-02-11 DIAGNOSIS — M7752 Other enthesopathy of left foot: Secondary | ICD-10-CM

## 2016-02-22 ENCOUNTER — Ambulatory Visit (INDEPENDENT_AMBULATORY_CARE_PROVIDER_SITE_OTHER): Payer: 59 | Admitting: Podiatry

## 2016-02-22 ENCOUNTER — Encounter: Payer: Self-pay | Admitting: Podiatry

## 2016-02-22 DIAGNOSIS — M722 Plantar fascial fibromatosis: Secondary | ICD-10-CM

## 2016-02-22 NOTE — Progress Notes (Signed)
re

## 2016-02-23 NOTE — Progress Notes (Signed)
Subjective:     Patient ID: Carolyn Gutierrez, female   DOB: 1955-05-09, 61 y.o.   MRN: [id number]  HPI   Review of Systems     Objective:   Physical Exam     Assessment:     Plantar fasciitis    Plan:     Physical therapy

## 2016-02-23 NOTE — Progress Notes (Signed)
She presents today for follow-up of her left foot and her MRI report. She states that there is really been no changes far as the pain.  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  Assessment: Plantar fasciitis with lateral compensatory syndrome.  Plan: Referred to physical therapy.

## 2016-02-23 NOTE — Progress Notes (Signed)
Patient ID: Carolyn Gutierrez, female   DOB: Apr 30, 1955, 61 y.o.   MRN: [id number]

## 2016-02-28 DIAGNOSIS — M722 Plantar fascial fibromatosis: Secondary | ICD-10-CM | POA: Diagnosis not present

## 2016-02-28 DIAGNOSIS — M79672 Pain in left foot: Secondary | ICD-10-CM | POA: Diagnosis not present

## 2016-02-28 DIAGNOSIS — R262 Difficulty in walking, not elsewhere classified: Secondary | ICD-10-CM | POA: Diagnosis not present

## 2016-02-28 DIAGNOSIS — Z719 Counseling, unspecified: Secondary | ICD-10-CM | POA: Diagnosis not present

## 2016-02-29 DIAGNOSIS — R262 Difficulty in walking, not elsewhere classified: Secondary | ICD-10-CM | POA: Diagnosis not present

## 2016-02-29 DIAGNOSIS — M722 Plantar fascial fibromatosis: Secondary | ICD-10-CM | POA: Diagnosis not present

## 2016-02-29 DIAGNOSIS — M79672 Pain in left foot: Secondary | ICD-10-CM | POA: Diagnosis not present

## 2016-03-06 DIAGNOSIS — Z719 Counseling, unspecified: Secondary | ICD-10-CM | POA: Diagnosis not present

## 2016-03-07 DIAGNOSIS — M79672 Pain in left foot: Secondary | ICD-10-CM | POA: Diagnosis not present

## 2016-03-07 DIAGNOSIS — R262 Difficulty in walking, not elsewhere classified: Secondary | ICD-10-CM | POA: Diagnosis not present

## 2016-03-07 DIAGNOSIS — M722 Plantar fascial fibromatosis: Secondary | ICD-10-CM | POA: Diagnosis not present

## 2016-03-08 DIAGNOSIS — R262 Difficulty in walking, not elsewhere classified: Secondary | ICD-10-CM | POA: Diagnosis not present

## 2016-03-08 DIAGNOSIS — M79672 Pain in left foot: Secondary | ICD-10-CM | POA: Diagnosis not present

## 2016-03-08 DIAGNOSIS — M722 Plantar fascial fibromatosis: Secondary | ICD-10-CM | POA: Diagnosis not present

## 2016-03-13 DIAGNOSIS — Z719 Counseling, unspecified: Secondary | ICD-10-CM | POA: Diagnosis not present

## 2016-03-13 DIAGNOSIS — R262 Difficulty in walking, not elsewhere classified: Secondary | ICD-10-CM | POA: Diagnosis not present

## 2016-03-13 DIAGNOSIS — M79672 Pain in left foot: Secondary | ICD-10-CM | POA: Diagnosis not present

## 2016-03-13 DIAGNOSIS — M722 Plantar fascial fibromatosis: Secondary | ICD-10-CM | POA: Diagnosis not present

## 2016-03-16 DIAGNOSIS — M79672 Pain in left foot: Secondary | ICD-10-CM | POA: Diagnosis not present

## 2016-03-16 DIAGNOSIS — M722 Plantar fascial fibromatosis: Secondary | ICD-10-CM | POA: Diagnosis not present

## 2016-03-16 DIAGNOSIS — R262 Difficulty in walking, not elsewhere classified: Secondary | ICD-10-CM | POA: Diagnosis not present

## 2016-03-17 DIAGNOSIS — M722 Plantar fascial fibromatosis: Secondary | ICD-10-CM | POA: Diagnosis not present

## 2016-03-17 DIAGNOSIS — R262 Difficulty in walking, not elsewhere classified: Secondary | ICD-10-CM | POA: Diagnosis not present

## 2016-03-17 DIAGNOSIS — M79672 Pain in left foot: Secondary | ICD-10-CM | POA: Diagnosis not present

## 2016-03-18 DIAGNOSIS — S61431A Puncture wound without foreign body of right hand, initial encounter: Secondary | ICD-10-CM | POA: Diagnosis not present

## 2016-03-18 DIAGNOSIS — Z23 Encounter for immunization: Secondary | ICD-10-CM | POA: Diagnosis not present

## 2016-03-22 DIAGNOSIS — M722 Plantar fascial fibromatosis: Secondary | ICD-10-CM | POA: Diagnosis not present

## 2016-03-22 DIAGNOSIS — M79672 Pain in left foot: Secondary | ICD-10-CM | POA: Diagnosis not present

## 2016-03-22 DIAGNOSIS — R262 Difficulty in walking, not elsewhere classified: Secondary | ICD-10-CM | POA: Diagnosis not present

## 2016-03-23 ENCOUNTER — Ambulatory Visit: Payer: 59 | Admitting: Podiatry

## 2016-03-29 DIAGNOSIS — R262 Difficulty in walking, not elsewhere classified: Secondary | ICD-10-CM | POA: Diagnosis not present

## 2016-03-29 DIAGNOSIS — M79672 Pain in left foot: Secondary | ICD-10-CM | POA: Diagnosis not present

## 2016-03-29 DIAGNOSIS — M722 Plantar fascial fibromatosis: Secondary | ICD-10-CM | POA: Diagnosis not present

## 2016-03-31 DIAGNOSIS — R262 Difficulty in walking, not elsewhere classified: Secondary | ICD-10-CM | POA: Diagnosis not present

## 2016-03-31 DIAGNOSIS — M722 Plantar fascial fibromatosis: Secondary | ICD-10-CM | POA: Diagnosis not present

## 2016-03-31 DIAGNOSIS — M79672 Pain in left foot: Secondary | ICD-10-CM | POA: Diagnosis not present

## 2016-04-03 DIAGNOSIS — M79672 Pain in left foot: Secondary | ICD-10-CM | POA: Diagnosis not present

## 2016-04-03 DIAGNOSIS — R262 Difficulty in walking, not elsewhere classified: Secondary | ICD-10-CM | POA: Diagnosis not present

## 2016-04-03 DIAGNOSIS — M722 Plantar fascial fibromatosis: Secondary | ICD-10-CM | POA: Diagnosis not present

## 2016-04-05 DIAGNOSIS — R262 Difficulty in walking, not elsewhere classified: Secondary | ICD-10-CM | POA: Diagnosis not present

## 2016-04-05 DIAGNOSIS — M722 Plantar fascial fibromatosis: Secondary | ICD-10-CM | POA: Diagnosis not present

## 2016-04-05 DIAGNOSIS — M79672 Pain in left foot: Secondary | ICD-10-CM | POA: Diagnosis not present

## 2016-04-21 DIAGNOSIS — M722 Plantar fascial fibromatosis: Secondary | ICD-10-CM | POA: Diagnosis not present

## 2016-04-21 DIAGNOSIS — R262 Difficulty in walking, not elsewhere classified: Secondary | ICD-10-CM | POA: Diagnosis not present

## 2016-04-21 DIAGNOSIS — M79672 Pain in left foot: Secondary | ICD-10-CM | POA: Diagnosis not present

## 2016-04-26 DIAGNOSIS — M79672 Pain in left foot: Secondary | ICD-10-CM | POA: Diagnosis not present

## 2016-04-26 DIAGNOSIS — R262 Difficulty in walking, not elsewhere classified: Secondary | ICD-10-CM | POA: Diagnosis not present

## 2016-04-26 DIAGNOSIS — M722 Plantar fascial fibromatosis: Secondary | ICD-10-CM | POA: Diagnosis not present

## 2016-05-09 DIAGNOSIS — L821 Other seborrheic keratosis: Secondary | ICD-10-CM | POA: Diagnosis not present

## 2016-05-09 DIAGNOSIS — L814 Other melanin hyperpigmentation: Secondary | ICD-10-CM | POA: Diagnosis not present

## 2016-05-09 DIAGNOSIS — D1801 Hemangioma of skin and subcutaneous tissue: Secondary | ICD-10-CM | POA: Diagnosis not present

## 2016-07-11 DIAGNOSIS — J019 Acute sinusitis, unspecified: Secondary | ICD-10-CM | POA: Diagnosis not present

## 2016-09-03 DIAGNOSIS — M545 Low back pain: Secondary | ICD-10-CM | POA: Diagnosis not present

## 2016-09-03 DIAGNOSIS — R5383 Other fatigue: Secondary | ICD-10-CM | POA: Diagnosis not present

## 2016-10-03 DIAGNOSIS — Z01419 Encounter for gynecological examination (general) (routine) without abnormal findings: Secondary | ICD-10-CM | POA: Diagnosis not present

## 2016-10-05 ENCOUNTER — Other Ambulatory Visit: Payer: Self-pay | Admitting: Obstetrics and Gynecology

## 2016-10-05 DIAGNOSIS — R928 Other abnormal and inconclusive findings on diagnostic imaging of breast: Secondary | ICD-10-CM

## 2016-10-06 LAB — HM PAP SMEAR: HM Pap smear: NORMAL

## 2016-10-12 ENCOUNTER — Ambulatory Visit
Admission: RE | Admit: 2016-10-12 | Discharge: 2016-10-12 | Disposition: A | Discharge status: Home / Self Care | Payer: 59 | Source: Ambulatory Visit | Attending: Obstetrics and Gynecology | Admitting: Obstetrics and Gynecology

## 2016-10-12 ENCOUNTER — Ambulatory Visit: Payer: Self-pay

## 2016-10-12 DIAGNOSIS — R922 Inconclusive mammogram: Secondary | ICD-10-CM | POA: Diagnosis not present

## 2016-10-12 DIAGNOSIS — R928 Other abnormal and inconclusive findings on diagnostic imaging of breast: Secondary | ICD-10-CM

## 2016-10-12 LAB — HM MAMMOGRAPHY

## 2016-10-24 ENCOUNTER — Encounter: Payer: Self-pay | Admitting: Podiatry

## 2016-10-24 ENCOUNTER — Ambulatory Visit (INDEPENDENT_AMBULATORY_CARE_PROVIDER_SITE_OTHER): Payer: 59 | Admitting: Podiatry

## 2016-10-24 DIAGNOSIS — M722 Plantar fascial fibromatosis: Secondary | ICD-10-CM

## 2016-10-24 NOTE — Patient Instructions (Signed)
Pre-Operative Instructions  Congratulations, you have decided to take an important step towards improving your quality of life.  You can be assured that the doctors and staff at [facility] will be with you every step of the way.  Here are some important things you should know:  1. Plan to be at the surgery center/hospital at least 1 (one) hour prior to your scheduled time, unless otherwise directed by the surgical center/hospital staff.  You must have a responsible adult accompany you, remain during the surgery and drive you home.  Make sure you have directions to the surgical center/hospital to ensure you arrive on time. 2. If you are having surgery at [facility] or [facility] hospitals, you will need a copy of your medical history and physical form from your family physician within one month prior to the date of surgery. We will give you a form for your primary physician to complete.  3. We make every effort to accommodate the date you request for surgery.  However, there are times where surgery dates or times have to be moved.  We will contact you as soon as possible if a change in schedule is required.   4. No aspirin/ibuprofen for one week before surgery.  If you are on aspirin, any non-steroidal anti-inflammatory medications (Mobic, Aleve, Ibuprofen) should not be taken seven (7) days prior to your surgery.  You make take Tylenol for pain prior to surgery.  5. Medications - If you are taking daily heart and blood pressure medications, seizure, reflux, allergy, asthma, anxiety, pain or diabetes medications, make sure you notify the surgery center/hospital before the day of surgery so they can tell you which medications you should take or avoid the day of surgery. 6. No food or drink after midnight the night before surgery unless directed otherwise by surgical center/hospital staff. 7. No alcoholic beverages 24-hours prior to surgery.  No smoking 24-hours prior or 24-hours after  surgery. 8. Wear loose pants or shorts. They should be loose enough to fit over bandages, boots, and casts. 9. Don't wear slip-on shoes. Sneakers are preferred. 10. Bring your boot with you to the surgery center/hospital.  Also bring crutches or a walker if your physician has prescribed it for you.  If you do not have this equipment, it will be provided for you after surgery. 11. If you have not been contacted by the surgery center/hospital by the day before your surgery, call to confirm the date and time of your surgery. 12. Leave-time from work may vary depending on the type of surgery you have.  Appropriate arrangements should be made prior to surgery with your employer. 13. Prescriptions will be provided immediately following surgery by your doctor.  Fill these as soon as possible after surgery and take the medication as directed. Pain medications will not be refilled on weekends and must be approved by the doctor. 14. Remove nail polish on the operative foot and avoid getting pedicures prior to surgery. 15. Wash the night before surgery.  The night before surgery wash the foot and leg well with water and the antibacterial soap provided. Be sure to pay special attention to beneath the toenails and in between the toes.  Wash for at least three (3) minutes. Rinse thoroughly with water and dry well with a towel.  Perform this wash unless told not to do so by your physician.  Enclosed: 1 Ice pack (please put in freezer the night before surgery)   1 Hibiclens skin cleaner     Pre-op instructions  If you have any questions regarding the instructions, please do not hesitate to call our office.  [place]: [address], [place], [place] [address] -- [phone]  [place]: [address] [place], [place] [address] -- [phone]  [place]: [address]  [place], [place] [address] -- [phone]  [place]: [address], [address], [place], [place] [address] -- [phone]  Website:  [email or web address] 

## 2016-10-24 NOTE — Progress Notes (Signed)
She presents today for follow-up of her plantar fasciitis to her right foot. She states that she's had this for over a year now physical therapy had resolved it but after a lot of walking and running it has come back once again. She states that she would like to consider something more permanent like EPAT. I expressed to her that this was not necessarily permanent fix and much like physical therapy. She states it is starting to affect her ability to perform her everyday activities and she will also have this corrected.  Objective: Vital signs are stable she is alert and oriented 3 and have reviewed her past medical history medications allergies surgery social history and review of systems. Pulses are strongly palpable she continues to have pain on palpation medial calcaneal tubercle of the right heel. No calf pain no redness or signs of infection.   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  Plan: We discussed EPAT today in great detail as well as an endoscopic plantar fasciotomy. She understands this and is amenable to it. She will like to consider an endoscopic plantar fasciotomy after she returns from [place] sometime in October or December. We went over the consent form today line by line number by number giving her ample time to ask questions and she saw fit regarding an endoscopic plantar fasciotomy. I answered all the questions regarding these procedures the best of my ability in layman's terms. She understood this was amenable to it and signed all 3 patient consent form. She has Cam Walker at home and a night splint. She will use these postoperatively. We discussed the need for a adequate block the anesthesia group as well as a surgical center.

## 2016-10-27 ENCOUNTER — Encounter: Payer: Self-pay | Admitting: Internal Medicine

## 2016-11-06 DIAGNOSIS — M9903 Segmental and somatic dysfunction of lumbar region: Secondary | ICD-10-CM | POA: Diagnosis not present

## 2016-11-06 DIAGNOSIS — M5137 Other intervertebral disc degeneration, lumbosacral region: Secondary | ICD-10-CM | POA: Diagnosis not present

## 2016-11-06 DIAGNOSIS — M9905 Segmental and somatic dysfunction of pelvic region: Secondary | ICD-10-CM | POA: Diagnosis not present

## 2016-12-12 DIAGNOSIS — L821 Other seborrheic keratosis: Secondary | ICD-10-CM | POA: Diagnosis not present

## 2016-12-12 DIAGNOSIS — D1800 Hemangioma unspecified site: Secondary | ICD-10-CM | POA: Diagnosis not present

## 2017-01-01 ENCOUNTER — Telehealth: Payer: Self-pay | Admitting: Podiatry

## 2017-01-02 NOTE — Telephone Encounter (Signed)
I am working with a massage and physical therapist. I was wanting to know if I can get my MRI sent to them. Please give me a call at [phone]. Thanks so much.

## 2017-01-02 NOTE — Telephone Encounter (Signed)
I called pt in regards to message she left this morning regarding her MRI. I asked her if she wanted just the report or both the report and the actual MRI. Pt stated she wanted both so that the physical therapist and masseuse could come up with a treatment plan. I told her she could contact where she had the MRI done at and they could give her a CD of the MRI and a print out of the report. She then asked if I only had the report to which I replied yes. She asked if I could just send that and then if they decided they also want her MRI she would get that from where it was performed. I told her she would need to sign a medical records release form before I could release the information. I told her I could e-mail her a form, fax one to her, or she could come by the office and fill it out. Pt asked me to e-mail it to her. I told her she has to physically fill it out and sign it and to also make sure she puts the e-mail address and/or fax number of where she wants me to send the report and once she returns it to me I would take care of that for her.

## 2017-03-06 DIAGNOSIS — L820 Inflamed seborrheic keratosis: Secondary | ICD-10-CM | POA: Diagnosis not present

## 2017-03-23 ENCOUNTER — Ambulatory Visit: Payer: Self-pay | Admitting: Family Medicine

## 2017-04-05 ENCOUNTER — Ambulatory Visit: Payer: 59 | Admitting: Family Medicine

## 2017-04-05 ENCOUNTER — Encounter: Payer: Self-pay | Admitting: Family Medicine

## 2017-04-05 VITALS — BP 111/71 | HR 69 | Temp 98.1°F | Ht 67.5 in | Wt 183.8 lb

## 2017-04-05 DIAGNOSIS — Z7689 Persons encountering health services in other specified circumstances: Secondary | ICD-10-CM | POA: Diagnosis not present

## 2017-04-05 DIAGNOSIS — M722 Plantar fascial fibromatosis: Secondary | ICD-10-CM

## 2017-04-05 DIAGNOSIS — F22 Delusional disorders: Secondary | ICD-10-CM

## 2017-04-05 MED ORDER — DICLOFENAC SODIUM ER 100 MG PO TB24: 100.0000 mg | ORAL_TABLET | Freq: Every day | ORAL | 3 refills | Status: DC

## 2017-04-05 NOTE — Progress Notes (Signed)
Patient ID: Carolyn Gutierrez, female  DOB: Jun 07, 1955, 62 y.o.   MRN: [id number] Patient Care Team    Relationship Specialty Notifications Start End  [name], DO PCP - General Family Medicine  04/05/17    Comment: transfer care to [name], MD Consulting Physician Obstetrics and Gynecology  04/05/17   [name], MD Attending Physician Gastroenterology  04/05/17   [name], [name], [place]  Optometry  04/09/17   [facility]    04/09/17     Chief Complaint  Patient presents with  . Establish Care    Pt c/o about Plantar Fasciitis and Type 1 DM    Subjective:  Carolyn Gutierrez is a 62 y.o.  female present for new patient establishment. All past medical history, surgical history, allergies, family history, immunizations, medications and social history were obtained and entered  in the electronic medical record today. All recent labs, ED visits and hospitalizations within the last year were reviewed.  Plantar fascitis: Patient reports she has suffered from plantar fasciitis for over a year. Initially it was in both of her feet, however it has resolved completely in one foot but remains in the other. She has been seen by podiatry for this condition. She has tried ice, stretches, physical therapy, acupuncture and cortisone shots. She has never tried orthotics. She reports it was feeling much better until she started walking and running for exercise and in the right "plantar fasciitis "returned.  Delusional disorder: Patient reports she was started on Zoloft approximately 3 years ago. She follows with the mood disorder clinic in [place].  Depression screen [facility] 2/9 04/05/2017  Decreased Interest 0  Down, Depressed, Hopeless 0  PHQ - 2 Score 0  Altered sleeping 1  Tired, decreased energy 0  Change in appetite 2  Feeling bad or failure about yourself  0  Trouble concentrating 0  Moving slowly or fidgety/restless 0  Suicidal thoughts 0  PHQ-9 Score 3    GAD 7 : Generalized Anxiety Score 04/05/2017  Nervous, Anxious, on Edge 0  Control/stop worrying 0  Worry too much - different things 0  Trouble relaxing 0  Restless 0  Easily annoyed or irritable 0  Afraid - awful might happen 0  Total GAD 7 Score 0      Current Exercise Habits: The patient does not participate in regular exercise at present   No flowsheet data found.    There is no immunization history on file for this patient.  No exam data present  Past Medical History:  Diagnosis Date  . Allergy   . Anxiety   . Asthma    in the past- no inhaler currently  . Depression   . Genital warts   . Hay fever   . Migraine   . Stress fracture of right foot    No Known Allergies Past Surgical History:  Procedure Laterality Date  . ABDOMINAL HYSTERECTOMY  01/17/2012   Procedure: HYSTERECTOMY ABDOMINAL;  Surgeon: [name], MD;  Location: [facility];  Service: Gynecology;  Laterality: N/A;  . BREAST SURGERY     breast bx  . COLONOSCOPY     11 yr ago with dr [name] ? a few polyps, no report  . KNEE SURGERY     right  . LAPAROSCOPIC ASSISTED VAGINAL HYSTERECTOMY  01/17/2012   Procedure: LAPAROSCOPIC ASSISTED VAGINAL HYSTERECTOMY;  Surgeon: [name], MD;  Location: [facility];  Service: Gynecology;  Laterality: N/A;  attempted   . SALPINGOOPHORECTOMY  01/17/2012   Procedure: SALPINGO OOPHORECTOMY;  Surgeon: [name], MD;  Location: [facility];  Service: Gynecology;  Laterality: Right;   Family History  Problem Relation Age of Onset  . Cancer Mother        pancreatic  . Pancreatic cancer Mother   . Depression Mother   . Cancer Father        basal cell, bladder  . Heart disease Father   . Cancer Sister        melanoma  . Arthritis Sister   . Asthma Sister   . Diabetes Sister   . Hyperlipidemia Sister   . Hypertension Sister   . Kidney disease Sister   . Diabetes Sister   . Hypertension Sister   . Arthritis Sister   . Colon cancer Neg Hx   .  Colon polyps Neg Hx   . Esophageal cancer Neg Hx   . Rectal cancer Neg Hx   . Stomach cancer Neg Hx    Social History   Socioeconomic History  . Marital status: Married    Spouse name: Not on file  . Number of children: Not on file  . Years of education: Not on file  . Highest education level: Not on file  Social Needs  . Financial resource strain: Not on file  . Food insecurity - worry: Not on file  . Food insecurity - inability: Not on file  . Transportation needs - medical: Not on file  . Transportation needs - non-medical: Not on file  Occupational History  . Not on file  Tobacco Use  . Smoking status: Never Smoker  . Smokeless tobacco: Never Used  Substance and Sexual Activity  . Alcohol use: Yes    Alcohol/week: 0.0 oz    Comment: beer daily-weekends only about 4 / weekend  . Drug use: No  . Sexual activity: No  Other Topics Concern  . Not on file  Social History Narrative   Married.    Bachelor's of arts degree Metallurgist    Wears a bicycle helmet, smoke alarm and home, wears her seatbelt.   Vegetarian, consumes dairy products.   Feels safe in her relationships.   Allergies as of 04/05/2017   No Known Allergies     Medication List        Accurate as of 04/05/17 11:59 PM. Always use your most recent med list.          Diclofenac Sodium CR 100 MG 24 hr tablet Take 1 tablet (100 mg total) by mouth daily.   multivitamin with minerals Tabs tablet Take 1 tablet by mouth daily.   sertraline 50 MG tablet Commonly known as:  ZOLOFT       All past medical history, surgical history, allergies, family history, immunizations andmedications were updated in the EMR today and reviewed under the history and medication portions of their EMR.    No results found for this or any previous visit (from the past 2160 hour(s)).  Mm Diag Breast Tomo Uni Left  Result Date: 10/12/2016 CLINICAL DATA:  62 year old patient recalled from recent screening mammogram  for evaluation of possible architectural distortion in the outer left breast. The patient has a remote history of an excisional biopsy of the in the lateral left breast. EXAM: 2D DIGITAL DIAGNOSTIC UNILATERAL LEFT MAMMOGRAM WITH CAD AND ADJUNCT TOMO COMPARISON:  October 03, 2016, July 27, 2015, May 11, 2014, April 29, 2013, March 15, 2012 February 20, 2012,  December 08, 2010, July 28, 2009 February 15, 2007 ACR Breast Density Category c: The breast tissue is heterogeneously dense, which may obscure small masses. FINDINGS: Additional focal spot compression images with tomography of the left breast are performed showing a stable appearance of architectural distortion in the lateral left breast, posteriorly. This appears similar to prior mammograms, and is felt to relate to postsurgical scar. To confirm, additional images with a radiopaque marker placed over of the cutaneous scar are performed. These confirm that the subtle architectural distortion is deep to the cutaneous surgical scar. No new areas of architectural distortion are identified. Mammographic images were processed with CAD. IMPRESSION: Architectural distortion in the lateral left breast is confirmed to be postsurgical scarring at the site of prior excisional biopsy. No suspicious findings. RECOMMENDATION: Screening mammogram in one year.(Code:SM-B-01Y) I have discussed the findings and recommendations with the patient. Results were also provided in writing at the conclusion of the visit. If applicable, a reminder letter will be sent to the patient regarding the next appointment. BI-RADS CATEGORY  2: Benign. Electronically Signed   By: Curlene Dolphin M.D.   On: 10/12/2016 08:29     ROS: 14 pt review of systems performed and negative (unless mentioned in an HPI)  Objective: BP 111/71 (BP Location: Left Arm, Patient Position: Sitting, Cuff Size: Normal)   Pulse 69   Temp 98.1 F (36.7 C) (Oral)   Ht 5' 7.5" (1.715 m)   Wt 183 lb 12.8 oz (83.4 kg)    SpO2 98%   BMI 28.36 kg/m  Gen: Afebrile. No acute distress. Nontoxic in appearance, well-developed, well-nourished, very pleasant, Caucasian female. HENT: AT. [place].  MMM, no oral lesions Eyes:Pupils Equal Round Reactive to light, Extraocular movements intact,  Conjunctiva without redness, discharge or icterus. CV: RRR  Chest: CTAB, no wheeze, rhonchi or crackles.  Neuro/Msk: Normal gait. PERLA. EOMi. Alert. Oriented x3. Right foot with mild pronation angle of ankle. Mild tender to palpation posterior medial arch. Neurovascularly intact distally. Psych: Normal affect, dress and demeanor. Normal speech. Normal thought content and judgment.   Assessment/plan: Carolyn Gutierrez is a 62 y.o. female present for establishment.  Plantar fasciitis - Discussed different options with her today she has some component of possible tarsal tunnel syndrome. The shin agreeable to try diclofenac daily, which also may help with her other migraine/headache conditions. She is also agreeable to referral to sports med to consider orthotics. - Diclofenac Sodium CR 100 MG 24 hr tablet; Take 1 tablet (100 mg total) by mouth daily.  Dispense: 90 tablet; Refill: 3 - Ambulatory referral to Sports Medicine  Delusional disorder [facility]) - follows with mood clinic at [place].     Return if symptoms worsen or fail to improve.   Note is dictated utilizing voice recognition software. Although note has been proof read prior to signing, occasional typographical errors still can be missed. If any questions arise, please do not hesitate to call for verification.  Electronically signed by: Howard Pouch, DO [place]

## 2017-04-05 NOTE — Patient Instructions (Signed)
It was a pleasure to meet you today.  Start diclofenac once daily with food. Take at least 4 weeks, if no improvement stop. If improvement you can continue daily or trial off medicine to see if resolved.   Capsicin cream may be helpful.  I have referred you to Dr.T to discuss orthotics.     Plantar Fasciitis Plantar fasciitis is a painful foot condition that affects the heel. It occurs when the band of tissue that connects the toes to the heel bone (plantar fascia) becomes irritated. This can happen after exercising too much or doing other repetitive activities (overuse injury). The pain from plantar fasciitis can range from mild irritation to severe pain that makes it difficult for you to walk or move. The pain is usually worse in the morning or after you have been sitting or lying down for a while. What are the causes? This condition may be caused by:  Standing for long periods of time.  Wearing shoes that do not fit.  Doing high-impact activities, including running, aerobics, and ballet.  Being overweight.  Having an abnormal way of walking (gait).  Having tight calf muscles.  Having high arches in your feet.  Starting a new athletic activity.  What are the signs or symptoms? The main symptom of this condition is heel pain. Other symptoms include:  Pain that gets worse after activity or exercise.  Pain that is worse in the morning or after resting.  Pain that goes away after you walk for a few minutes.  How is this diagnosed? This condition may be diagnosed based on your signs and symptoms. Your health care provider will also do a physical exam to check for:  A tender area on the bottom of your foot.  A high arch in your foot.  Pain when you move your foot.  Difficulty moving your foot.  You may also need to have imaging studies to confirm the diagnosis. These can include:  X-rays.  Ultrasound.  MRI.  How is this treated? Treatment for plantar fasciitis  depends on the severity of the condition. Your treatment may include:  Rest, ice, and over-the-counter pain medicines to manage your pain.  Exercises to stretch your calves and your plantar fascia.  A splint that holds your foot in a stretched, upward position while you sleep (night splint).  Physical therapy to relieve symptoms and prevent problems in the future.  Cortisone injections to relieve severe pain.  Extracorporeal shock wave therapy (ESWT) to stimulate damaged plantar fascia with electrical impulses. It is often used as a last resort before surgery.  Surgery, if other treatments have not worked after 12 months.  Follow these instructions at home:  Take medicines only as directed by your health care provider.  Avoid activities that cause pain.  Roll the bottom of your foot over a bag of ice or a bottle of cold water. Do this for 20 minutes, 3-4 times a day.  Perform simple stretches as directed by your health care provider.  Try wearing athletic shoes with air-sole or gel-sole cushions or soft shoe inserts.  Wear a night splint while sleeping, if directed by your health care provider.  Keep all follow-up appointments with your health care provider. How is this prevented?  Do not perform exercises or activities that cause heel pain.  Consider finding low-impact activities if you continue to have problems.  Lose weight if you need to. The best way to prevent plantar fasciitis is to avoid the activities that aggravate  your plantar fascia. Contact a health care provider if:  Your symptoms do not go away after treatment with home care measures.  Your pain gets worse.  Your pain affects your ability to move or do your daily activities. This information is not intended to replace advice given to you by your health care provider. Make sure you discuss any questions you have with your health care provider. Document Released: 10/18/2000 Document Revised: 06/28/2015  Document Reviewed: 12/03/2013 Elsevier Interactive Patient Education  2018 [employer].    Tarsal Tunnel Syndrome Tarsal tunnel syndrome is a condition that happens when a nerve (tibial nerve) is irritated or squeezed (compressed) as it passes through an area on the inside of your ankle (tarsal tunnel). The tarsal tunnel is a narrow passage through the connective tissue and bones in your feet (tarsals). The tibial nerve passes behind the large bony bump at your inner ankle (medial malleolus) and sends branches to your foot and toes. This nerve helps supply feeling to your heel, to the bottom of your foot, and to some of your toe muscles. Tarsal tunnel syndrome usually causes ankle and foot pain that gets worse with activity. What are the causes? Tarsal tunnel syndrome can be caused by any condition that narrows the space in the tarsal tunnel. Athletes may get tarsal tunnel syndrome from a fractured ankle or from an outward (eversion) ankle sprain that results in scarring or swelling. Other common causes include:  Over-pronation. This is when your feet roll in and flatten too much when you stand, walk, or run.  Extra pressure on the tarsal tunnel area from tight or stiff shoes or boots.  Decreased room in the tarsal tunnel due to small, fluid-filled sacs (cysts) or growths on the bones near the tunnel (exostosis).  What increases the risk? This condition is more likely to develop in people who:  Play sports where they wear high, stiff boots, such as downhill skiing.  Play sports with repetitive motion, such as running.  Play sports on uneven surfaces that can lead to a sprained ankle, such as soccer or football.  Have had an inner ankle injury.  Have flat feet.  Have a condition such as diabetes, hypothyroidism, or rheumatoid arthritis.  What are the signs or symptoms? Symptoms of this condition include:  Burning pain behind the ankle, in the heel, or in the foot that gets worse  if you are standing, walking, or running.  Numbness or a tingling sensation (pins and needles) in your heel, foot, or toes.  At first, your symptoms may get worse with activity and be relieved with rest. Over time, your symptoms may become constant or come on sooner with less activity. How is this diagnosed? This condition is diagnosed based on your symptoms, medical history, and physical exam. During the exam, your health care provider may tap on the area below your ankle to check for tingling in your foot or toes. Your health care provider may also inject numbing medicine into the tarsal tunnel to see if it relieves your pain. You may also have other tests, including:  X-rays to check bone structure.  MRI or ultrasound to examine nerve and tendon structures and find where your nerve is getting compressed.  An electrical study of nerve function (electromyography, or EMG).  How is this treated? Treatment may include:  Wearing a removable splint or boot for ankle support.  Using a shoe insert (orthotic) to help support your arch.  Using ice to reduce swelling.  Taking  pain medicine.  Having medicine injected into your ankle joint to reduce pain and swelling.  Starting range-of-motion exercises and strengthening exercises.  Gradually returning to full activity. The timing will depend on the severity of your condition and your response to treatment.  You may need surgery if there is a soft tissue growth or a bone growth, or if other treatments have not helped. After surgery, you may need to wear a removable splint or boot for support. You also may need physical therapy. Follow these instructions at home: If you have a splint or boot:  Wear the splint or boot as told by your health care provider. Remove it only as told by your health care provider.  Loosen the splint or boot if your toes tingle, become numb, or turn cold and blue.  If your splint or boot is not waterproof: ? Do  not let it get wet. ? Cover it with a watertight covering when you take a bath or shower.  Keep the splint or boot clean. Managing pain, stiffness, and swelling  If directed, put ice on the injured area. ? Put ice in a plastic bag. ? Place a towel between your skin and the bag. ? Leave the ice on for 20 minutes, 2-3 times a day.  Move your toes often to avoid stiffness and to lessen swelling.  Raise (elevate) your foot above the level of your heart while you are sitting or lying down. Driving  Ask your health care provider when it is safe to drive if you have a splint or boot on a foot that you use for driving.  Do not drive or operate heavy machinery while taking prescription pain medicine. Activity  Return to your normal activities as told by your health care provider. Ask your health care provider what activities are safe for you.  Do exercises as told by your health care provider. General instructions  Do not use any tobacco products, such as cigarettes, chewing tobacco, and e-cigarettes. Tobacco can delay healing. If you need help quitting, ask your health care provider.  Take over-the-counter and prescription medicines only as told by your health care provider.  Keep all follow-up visits as told by your health care provider. This is important. How is this prevented?  Give your body time to rest between periods of activity.  Make sure to wear supportive and comfortable shoes during athletic activity.  Do not over-tighten ski boots or the laces on high-top shoes.  Be safe and responsible while being active to avoid falls. Contact a health care provider if:  Your ankle pain is not getting better.  You are unable to support (bear) body weight on your foot without pain. This information is not intended to replace advice given to you by your health care provider. Make sure you discuss any questions you have with your health care provider. Document Released: 01/23/2005  Document Revised: 09/29/2015 Document Reviewed: 01/05/2015 Elsevier Interactive Patient Education  [employer].   Please help [place] help you:  We are honored you have chosen [facility] for your Primary Care home. Below you will find basic instructions that you may need to access in the future. Please help [place] help you by reading the instructions, which cover many of the frequent questions we experience.   Prescription refills and request:  -In order to allow more efficient response time, please call your pharmacy for all refills. They will forward the request electronically to [place]. This allows for the quickest possible  response. Request left on a nurse line can take longer to refill, since these are checked as time allows between office patients and other phone calls.  - refill request can take up to 3-5 working days to complete.  - If request is sent electronically and request is appropiate, it is usually completed in 1-2 business days.  - all patients will need to be seen routinely for all chronic medical conditions requiring prescription medications (see follow-up below). If you are overdue for follow up on your condition, you will be asked to make an appointment and we will call in enough medication to cover you until your appointment (up to 30 days).  - all controlled substances will require a face to face visit to request/refill.  - if you desire your prescriptions to go through a new pharmacy, and have an active script at original pharmacy, you will need to call your pharmacy and have scripts transferred to new pharmacy. This is completed between the pharmacy locations and not by your provider.    Results: If any images or labs were ordered, it can take up to 1 week to get results depending on the test ordered and the lab/facility running and resulting the test. - Normal or stable results, which do not need further discussion, may be released to your mychart immediately with attached  note to you. A call may not be generated for normal results. Please make certain to sign up for mychart. If you have questions on how to activate your mychart you can call the front office.  - If your results need further discussion, our office will attempt to contact you via phone, and if unable to reach you after 2 attempts, we will release your abnormal result to your mychart with instructions.  - All results will be automatically released in mychart after 1 week.  - Your provider will provide you with explanation and instruction on all relevant material in your results. Please keep in mind, results and labs may appear confusing or abnormal to the untrained eye, but it does not mean they are actually abnormal for you personally. If you have any questions about your results that are not covered, or you desire more detailed explanation than what was provided, you should make an appointment with your provider to do so.   Our office handles many outgoing and incoming calls daily. If we have not contacted you within 1 week about your results, please check your mychart to see if there is a message first and if not, then contact our office.  In helping with this matter, you help decrease call volume, and therefore allow [place] to be able to respond to patients needs more efficiently.   Acute office visits (sick visit):  An acute visit is intended for a new problem and are scheduled in shorter time slots to allow schedule openings for patients with new problems. This is the appropriate visit to discuss a new problem. In order to provide you with excellent quality medical care with proper time for you to explain your problem, have an exam and receive treatment with instructions, these appointments should be limited to one new problem per visit. If you experience a new problem, in which you desire to be addressed, please make an acute office visit, we save openings on the schedule to accommodate you. Please do not save  your new problem for any other type of visit, let us take care of it properly and quickly for you.   Follow up  visits:  Depending on your condition(s) your provider will need to see you routinely in order to provide you with quality care and prescribe medication(s). Most chronic conditions (Example: hypertension, Diabetes, depression/anxiety... etc), require visits a couple times a year. Your provider will instruct you on proper follow up for your personal medical conditions and history. Please make certain to make follow up appointments for your condition as instructed. Failing to do so could result in lapse in your medication treatment/refills. If you request a refill, and are overdue to be seen on a condition, we will always provide you with a 30 day script (once) to allow you time to schedule.    Medicare wellness (well visit): - we have a wonderful Nurse Maudie Mercury), that will meet with you and provide you will yearly medicare wellness visits. These visits should occur yearly (can not be scheduled less than 1 calendar year apart) and cover preventive health, immunizations, advance directives and screenings you are entitled to yearly through your medicare benefits. Do not miss out on your entitled benefits, this is when medicare will pay for these benefits to be ordered for you.  These are strongly encouraged by your provider and is the appropriate type of visit to make certain you are up to date with all preventive health benefits. If you have not had your medicare wellness exam in the last 12 months, please make certain to schedule one by calling the office and schedule your medicare wellness with Maudie Mercury as soon as possible.   Yearly physical (well visit):  - Adults are recommended to be seen yearly for physicals. Check with your insurance and date of your last physical, most insurances require one calendar year between physicals. Physicals include all preventive health topics, screenings, medical exam and  labs that are appropriate for gender/age and history. You may have fasting labs needed at this visit. This is a well visit (not a sick visit), new problems should not be covered during this visit (see acute visit).  - Pediatric patients are seen more frequently when they are younger. Your provider will advise you on well child visit timing that is appropriate for your their age. - This is not a medicare wellness visit. Medicare wellness exams do not have an exam portion to the visit. Some medicare companies allow for a physical, some do not allow a yearly physical. If your medicare allows a yearly physical you can schedule the medicare wellness with our nurse Maudie Mercury and have your physical with your provider after, on the same day. Please check with insurance for your full benefits.   Late Policy/No Shows:  - all new patients should arrive 15-30 minutes earlier than appointment to allow [place] time  to  obtain all personal demographics,  insurance information and for you to complete office paperwork. - All established patients should arrive 10-15 minutes earlier than appointment time to update all information and be checked in .  - In our best efforts to run on time, if you are late for your appointment you will be asked to either reschedule or if able, we will work you back into the schedule. There will be a wait time to work you back in the schedule,  depending on availability.  - If you are unable to make it to your appointment as scheduled, please call 24 hours ahead of time to allow [place] to fill the time slot with someone else who needs to be seen. If you do not cancel your appointment ahead of time, you  may be charged a no show fee.

## 2017-04-09 ENCOUNTER — Encounter: Payer: Self-pay | Admitting: Family Medicine

## 2017-04-09 DIAGNOSIS — M722 Plantar fascial fibromatosis: Secondary | ICD-10-CM | POA: Insufficient documentation

## 2017-04-11 ENCOUNTER — Encounter: Payer: Self-pay | Admitting: Sports Medicine

## 2017-04-19 DIAGNOSIS — R1031 Right lower quadrant pain: Secondary | ICD-10-CM | POA: Diagnosis not present

## 2017-04-20 DIAGNOSIS — M545 Low back pain: Secondary | ICD-10-CM | POA: Diagnosis not present

## 2017-04-20 DIAGNOSIS — N898 Other specified noninflammatory disorders of vagina: Secondary | ICD-10-CM | POA: Diagnosis not present

## 2017-04-23 ENCOUNTER — Ambulatory Visit: Payer: 59 | Admitting: Sports Medicine

## 2017-04-23 ENCOUNTER — Encounter: Payer: Self-pay | Admitting: Sports Medicine

## 2017-04-23 DIAGNOSIS — M722 Plantar fascial fibromatosis: Secondary | ICD-10-CM

## 2017-04-23 NOTE — Progress Notes (Signed)
Subjective:    I'm seeing this patient as a consultation for: Dr. Howard Pouch  CC: Right foot pain  HPI: This is a pleasant 62 year old female, for years she has had pain in her right heel, moderate, persistent without radiation, worse with the first few steps in the morning.  She has tried injections, nighttime splinting, rehab exercises, NSAIDs without much efficacy.  She is referred to me for further evaluation and definitive treatment.  I reviewed the past medical history, family history, social history, surgical history, and allergies today and no changes were needed.  Please see the problem list section below in epic for further details.  Past Medical History: Past Medical History:  Diagnosis Date  . Allergy   . Anxiety   . Asthma    in the past- no inhaler currently  . Depression   . Genital warts   . Hay fever   . Migraine   . Stress fracture of right foot    Past Surgical History: Past Surgical History:  Procedure Laterality Date  . ABDOMINAL HYSTERECTOMY  01/17/2012   Procedure: HYSTERECTOMY ABDOMINAL;  Surgeon: [name], MD;  Location: [facility];  Service: Gynecology;  Laterality: N/A;  . BREAST SURGERY     breast bx  . COLONOSCOPY     11 yr ago with dr [name] ? a few polyps, no report  . KNEE SURGERY     right  . LAPAROSCOPIC ASSISTED VAGINAL HYSTERECTOMY  01/17/2012   Procedure: LAPAROSCOPIC ASSISTED VAGINAL HYSTERECTOMY;  Surgeon: [name], MD;  Location: [facility];  Service: Gynecology;  Laterality: N/A;  attempted   . SALPINGOOPHORECTOMY  01/17/2012   Procedure: SALPINGO OOPHORECTOMY;  Surgeon: [name], MD;  Location: [facility];  Service: Gynecology;  Laterality: Right;   Social History: Social History   Socioeconomic History  . Marital status: Married    Spouse name: None  . Number of children: None  . Years of education: None  . Highest education level: None  Social Needs  . Financial resource strain: None  . Food  insecurity - worry: None  . Food insecurity - inability: None  . Transportation needs - medical: None  . Transportation needs - non-medical: None  Occupational History  . None  Tobacco Use  . Smoking status: Never Smoker  . Smokeless tobacco: Never Used  Substance and Sexual Activity  . Alcohol use: Yes    Alcohol/week: 0.0 oz    Comment: beer daily-weekends only about 4 / weekend  . Drug use: No  . Sexual activity: No  Other Topics Concern  . None  Social History Narrative   Married.    Bachelor's of arts degree Metallurgist    Wears a bicycle helmet, smoke alarm and home, wears her seatbelt.   Vegetarian, consumes dairy products.   Feels safe in her relationships.   Family History: Family History  Problem Relation Age of Onset  . Cancer Mother        pancreatic  . Pancreatic cancer Mother   . Depression Mother   . Cancer Father        basal cell, bladder  . Heart disease Father   . Cancer Sister        melanoma  . Arthritis Sister   . Asthma Sister   . Diabetes Sister   . Hyperlipidemia Sister   . Hypertension Sister   . Kidney disease Sister   . Diabetes Sister   . Hypertension Sister   .  Arthritis Sister   . Colon cancer Neg Hx   . Colon polyps Neg Hx   . Esophageal cancer Neg Hx   . Rectal cancer Neg Hx   . Stomach cancer Neg Hx    Allergies: No Known Allergies Medications: See med rec.  Review of Systems: No headache, visual changes, nausea, vomiting, diarrhea, constipation, dizziness, abdominal pain, skin rash, fevers, chills, night sweats, weight loss, swollen lymph nodes, body aches, joint swelling, muscle aches, chest pain, shortness of breath, mood changes, visual or auditory hallucinations.   Objective:   General: Well Developed, well nourished, and in no acute distress.  Neuro:  Extra-ocular muscles intact, able to move all 4 extremities, sensation grossly intact.  Deep tendon reflexes tested were normal. Psych: Alert and oriented,  mood congruent with affect. ENT:  Ears and nose appear unremarkable.  Hearing grossly normal. Neck: Unremarkable overall appearance, trachea midline.  No visible thyroid enlargement. Eyes: Conjunctivae and lids appear unremarkable.  Pupils equal and round. Skin: Warm and dry, no rashes noted.  Cardiovascular: Pulses palpable, no extremity edema. Right foot: No visible erythema or swelling. Range of motion is full in all directions. Strength is 5/5 in all directions. No hallux valgus. Pes cavus No abnormal callus noted. No pain over the navicular prominence, or base of fifth metatarsal. Moderate tenderness to palpation of the calcaneal insertion of plantar fascia. No pain at the Achilles insertion. No pain over the calcaneal bursa. No pain of the retrocalcaneal bursa. No tenderness to palpation over the tarsals, metatarsals, or phalanges. No hallux rigidus or limitus. No tenderness palpation over interphalangeal joints. No pain with compression of the metatarsal heads. Neurovascularly intact distally.  Impression and Recommendations:   This case required medical decision making of moderate complexity.  Plantar fasciitis, right Has tried multiple modalities in the past. We are going to start from scratch, she will return for custom orthotics, start NSAIDs, avoid barefoot walking, I am also going to give her some rehab exercises. After 1 month in the orthotic if not feeling much better we will try an injection, if still no relief we will do an MRI of the foot to make sure that we have the right diagnosis. ___________________________________________ Gwen Her. Dianah Field, M.D., ABFM., CAQSM. Primary Care and [place] [employer]structor of [address] [facility]

## 2017-04-23 NOTE — Assessment & Plan Note (Signed)
Has tried multiple modalities in the past. We are going to start from scratch, she will return for custom orthotics, start NSAIDs, avoid barefoot walking, I am also going to give her some rehab exercises. After 1 month in the orthotic if not feeling much better we will try an injection, if still no relief we will do an MRI of the foot to make sure that we have the right diagnosis.

## 2017-04-25 ENCOUNTER — Encounter (HOSPITAL_COMMUNITY): Payer: Self-pay | Admitting: Family Medicine

## 2017-04-25 DIAGNOSIS — R1031 Right lower quadrant pain: Secondary | ICD-10-CM | POA: Insufficient documentation

## 2017-04-25 DIAGNOSIS — R911 Solitary pulmonary nodule: Secondary | ICD-10-CM | POA: Insufficient documentation

## 2017-04-25 DIAGNOSIS — M545 Low back pain: Secondary | ICD-10-CM | POA: Diagnosis not present

## 2017-04-25 DIAGNOSIS — R109 Unspecified abdominal pain: Secondary | ICD-10-CM | POA: Diagnosis not present

## 2017-04-25 DIAGNOSIS — Z79891 Long term (current) use of opiate analgesic: Secondary | ICD-10-CM | POA: Diagnosis not present

## 2017-04-25 NOTE — ED Triage Notes (Addendum)
Patient reports she developed right lower back pain on last Wednesday. Also, complains on Wednesday she developed one episode of sharp, right lower abd pain. Patient went to an urgent care on Thursday and was informed it was not a bladder infection. On Friday, she was diagnosed with BV and started on Flagyl by her gyn. Patient denies any fever, increased in urgency, frequency, blood in urine, or pain that radiates around to there abd. Patient reports she has recently been moving but denies picking up anything heavy.

## 2017-04-26 ENCOUNTER — Emergency Department (HOSPITAL_COMMUNITY)
Admission: EM | Admit: 2017-04-26 | Discharge: 2017-04-26 | Disposition: A | Discharge status: Home / Self Care | Payer: 59 | Attending: Emergency Medicine | Admitting: Emergency Medicine

## 2017-04-26 ENCOUNTER — Emergency Department (HOSPITAL_COMMUNITY): Payer: 59

## 2017-04-26 ENCOUNTER — Encounter (HOSPITAL_COMMUNITY): Payer: Self-pay

## 2017-04-26 DIAGNOSIS — R911 Solitary pulmonary nodule: Secondary | ICD-10-CM

## 2017-04-26 DIAGNOSIS — R109 Unspecified abdominal pain: Secondary | ICD-10-CM | POA: Diagnosis not present

## 2017-04-26 LAB — URINALYSIS, ROUTINE W REFLEX MICROSCOPIC
Bilirubin Urine: NEGATIVE
GLUCOSE, UA: NEGATIVE mg/dL
Ketones, ur: NEGATIVE mg/dL
NITRITE: NEGATIVE
PH: 6 (ref 5.0–8.0)
Protein, ur: NEGATIVE mg/dL
SPECIFIC GRAVITY, URINE: 1.006 (ref 1.005–1.030)

## 2017-04-26 NOTE — Discharge Instructions (Addendum)
Your CT scan did not show the cause for your pain, but did show some nodules in the left lung.  Radiologist recommends repeat CT scan in 3-6 months.  If the nodules have not changed in that time, no further evaluation is needed.  Take ibuprofen or naproxen as needed for pain.  Return to the emergency department if symptoms are getting worse.

## 2017-04-26 NOTE — ED Provider Notes (Signed)
[facility] DEPT Provider Note   CSN: [id number] Arrival date & time: 04/25/17  2126     History   Chief Complaint Chief Complaint  Patient presents with  . Back Pain    HPI Carolyn Gutierrez is a 62 y.o. female.  The history is provided by the patient.  Back Pain    She has history of depression and anxiety and comes in with 8-day history of pain in the right lower back.  She went to urgent care where urinalysis was reported to not show any evidence of infection.  She went to her gynecologist who told her that she had bacterial vaginosis and treated her with metronidazole.  In the meantime, pain has not improved, and got worse tonight.  She rates pain currently at 5/10.  Nothing makes the pain better, nothing makes it worse.  She has been taking ibuprofen which does give partial, temporary relief of pain.  She denies fever, chills, sweats.  She denies nausea or vomiting.  She denies urinary urgency, frequency, tenesmus, dysuria.  She has not had similar problems in the past.  Past Medical History:  Diagnosis Date  . Allergy   . Anxiety   . Asthma    in the past- no inhaler currently  . Depression   . Genital warts   . Hay fever   . Migraine   . Stress fracture of right foot     Patient Active Problem List   Diagnosis Date Noted  . Plantar fasciitis, right 04/09/2017  . Osteopenia 07/07/2015  . Headache, migraine 03/30/2015  . Delusional disorder ([place]) 11/17/2013    Past Surgical History:  Procedure Laterality Date  . ABDOMINAL HYSTERECTOMY  01/17/2012   Procedure: HYSTERECTOMY ABDOMINAL;  Surgeon: [name], MD;  Location: [facility];  Service: Gynecology;  Laterality: N/A;  . BREAST SURGERY     breast bx  . COLONOSCOPY     11 yr ago with dr [name] ? a few polyps, no report  . KNEE SURGERY     right  . LAPAROSCOPIC ASSISTED VAGINAL HYSTERECTOMY  01/17/2012   Procedure: LAPAROSCOPIC ASSISTED VAGINAL HYSTERECTOMY;  Surgeon:  [name], MD;  Location: [facility];  Service: Gynecology;  Laterality: N/A;  attempted   . SALPINGOOPHORECTOMY  01/17/2012   Procedure: SALPINGO OOPHORECTOMY;  Surgeon: [name], MD;  Location: [facility];  Service: Gynecology;  Laterality: Right;    OB History    Gravida  0   Para  0   Term  0   Preterm  0   AB  0   Living  0     SAB  0   TAB  0   Ectopic  0   Multiple  0   Live Births  0            Home Medications    Prior to Admission medications   Medication Sig Start Date End Date Taking? Authorizing Provider  metroNIDAZOLE (FLAGYL) 500 MG tablet Take 500 mg by mouth 2 (two) times daily.  04/20/17  Yes [provider]  Multiple Vitamin (MULTIVITAMIN WITH MINERALS) TABS Take 1 tablet by mouth daily.   Yes [provider]  sertraline (ZOLOFT) 50 MG tablet Take 50 mg by mouth daily.  03/03/15  Yes [provider]  Diclofenac Sodium CR 100 MG 24 hr tablet Take 1 tablet (100 mg total) by mouth daily. Patient not taking: Reported on 04/26/2017 04/05/17   [name],  DO    Family History Family History  Problem Relation Age of Onset  . Cancer Mother        pancreatic  . Pancreatic cancer Mother   . Depression Mother   . Cancer Father        basal cell, bladder  . Heart disease Father   . Cancer Sister        melanoma  . Arthritis Sister   . Asthma Sister   . Diabetes Sister   . Hyperlipidemia Sister   . Hypertension Sister   . Kidney disease Sister   . Diabetes Sister   . Hypertension Sister   . Arthritis Sister   . Colon cancer Neg Hx   . Colon polyps Neg Hx   . Esophageal cancer Neg Hx   . Rectal cancer Neg Hx   . Stomach cancer Neg Hx     Social History Social History   Tobacco Use  . Smoking status: Never Smoker  . Smokeless tobacco: Never Used  Substance Use Topics  . Alcohol use: Yes    Alcohol/week: 0.0 oz    Comment: Last drink was over a week ago.   . Drug use: No     Allergies     Patient has no known allergies.   Review of Systems Review of Systems  Musculoskeletal: Positive for back pain.  All other systems reviewed and are negative.    Physical Exam Updated Vital Signs BP 127/68 (BP Location: Left Arm)   Pulse 87   Temp 97.8 F (36.6 C) (Oral)   Resp 18   Ht 5\' 7"  (1.702 m)   Wt 85.7 kg (189 lb)   SpO2 97%   BMI 29.60 kg/m   Physical Exam  Nursing note and vitals reviewed.  62 year old female, resting comfortably and in no acute distress. Vital signs are normal. Oxygen saturation is 97%, which is normal. Head is normocephalic and atraumatic. PERRLA, EOMI. Oropharynx is clear. Neck is nontender and supple without adenopathy or JVD. Back is nontender in the midline.  There is mild to moderate right CVA tenderness. Lungs are clear without rales, wheezes, or rhonchi. Chest is nontender. Heart has regular rate and rhythm without murmur. Abdomen is soft, flat, nontender without masses or hepatosplenomegaly and peristalsis is normoactive. Extremities have no cyanosis or edema, full range of motion is present. Skin is warm and dry without rash. Neurologic: Mental status is normal, cranial nerves are intact, there are no motor or sensory deficits.  ED Treatments / Results  Labs (all labs ordered are listed, but only abnormal results are displayed) Labs Reviewed  URINALYSIS, ROUTINE W REFLEX MICROSCOPIC - Abnormal; Notable for the following components:      Result Value   Hgb urine dipstick SMALL (*)    Leukocytes, UA SMALL (*)    Bacteria, UA RARE (*)    Squamous Epithelial / LPF 6-30 (*)    All other components within normal limits    Radiology Ct Renal Stone Study  Result Date: 04/26/2017 CLINICAL DATA:  Right flank pain. EXAM: CT ABDOMEN AND PELVIS WITHOUT CONTRAST TECHNIQUE: Multidetector CT imaging of the abdomen and pelvis was performed following the standard protocol without IV contrast. COMPARISON:  None. FINDINGS: Lower chest: 6 mm  triangular nodule in the lingula, image 7 series 4, which may be an intrapulmonary lymph node. Punctate left lower lobe nodule image 3 series 4. Scattered linear atelectasis. No confluent consolidation or pleural fluid. Hepatobiliary: No focal liver abnormality is seen.  Borderline steatosis. No gallstones, gallbladder wall thickening, or biliary dilatation. Pancreas: No ductal dilatation or inflammation. Spleen: Normal in size without focal abnormality. Adrenals/Urinary Tract: No renal stones or hydronephrosis. No perinephric edema. Both ureters are decompressed without stones along the course. Urinary bladder is physiologically distended without wall thickening. No bladder stone. Normal adrenal glands. Stomach/Bowel: Stomach physiologically distended. Mild fecalization of distal small bowel contents. No small bowel obstruction, inflammation or wall thickening. Moderate stool throughout the entire colon. No colonic wall thickening. Appendix not confidently visualized. No pericecal or right lower quadrant inflammatory change. Vascular/Lymphatic: Mild aortic atherosclerosis. No aneurysm. Small reactive appearing retroperitoneal nodes. No bulky adenopathy. Reproductive: Status post hysterectomy. No adnexal masses. Other: No free air, free fluid, or intra-abdominal fluid collection. Tiny fat containing umbilical hernia. Musculoskeletal: Degenerative change in the lumbar spine with primarily facet arthropathy. Mild degenerative disc disease at L5-S1. There are no acute or suspicious osseous abnormalities. IMPRESSION: 1. No renal stones or obstructive uropathy. No acute abnormality in the abdomen/pelvis. 2. Moderate colonic stool burden with mild fecalization of distal small bowel contents suggesting slow transit/constipation. 3. Pulmonary nodules in the lingula and left lower lobe, largest measuring 6 mm. Non-contrast chest CT at 3-6 months is recommended. If the nodules are stable at time of repeat CT, then future CT at  18-24 months (from today's scan) is considered optional for low-risk patients, but is recommended for high-risk patients. This recommendation follows the consensus statement: Guidelines for Management of Incidental Pulmonary Nodules Detected on CT Images: From the Fleischner Society 2017; Radiology 2017; 284:228-243. 4.  Aortic Atherosclerosis (ICD10-I70.0). Electronically Signed   By: Jeb Levering M.D.   On: 04/26/2017 06:15    Procedures Procedures (including critical care time)  Medications Ordered in ED Medications - No data to display   Initial Impression / Assessment and Plan / ED Course  I have reviewed the triage vital signs and the nursing notes.  Pertinent labs & imaging results that were available during my care of the patient were reviewed by me and considered in my medical decision making (see chart for details).  Right flank pain of uncertain cause.  Suspect urolithiasis.  Consider urinary tract infection, musculoskeletal pain.  Urinalysis will be obtained and she will be sent for renal stone protocol CT scan.  Old records are reviewed, and she has no relevant past visits.  CT scan shows no evidence of urolithiasis, or any other changes to cause her pain.  Incidental finding of nodules in the left lung.  I have advised patient of these, as well as recommendation that CT scan be repeated in 3-6 months.  In the meantime, continue symptomatic treatment of flank pain.  Return precautions discussed.  Final Clinical Impressions(s) / ED Diagnoses   Final diagnoses:  Right flank pain  Nodule of left lung    ED Discharge Orders    None       Delora Fuel, MD 16/10/96 [phone]

## 2017-04-27 ENCOUNTER — Ambulatory Visit: Payer: 59 | Admitting: Sports Medicine

## 2017-04-27 ENCOUNTER — Encounter: Payer: Self-pay | Admitting: Sports Medicine

## 2017-04-27 DIAGNOSIS — M722 Plantar fascial fibromatosis: Secondary | ICD-10-CM

## 2017-04-27 NOTE — Assessment & Plan Note (Signed)
Orthotics as above, continue rehab exercises, NSAIDs. Return to see me in 1 month, injection if no better, MRI if no better after that.

## 2017-04-27 NOTE — Progress Notes (Signed)
    Patient was fitted for a : standard, cushioned, semi-rigid orthotic. The orthotic was heated and afterward the patient stood on the orthotic blank positioned on the orthotic stand. The patient was positioned in subtalar neutral position and 10 degrees of ankle dorsiflexion in a weight bearing stance. After completion of molding, a stable base was applied to the orthotic blank. The blank was ground to a stable position for weight bearing. Size: 10 Base: White EVA Additional Posting and Padding: None The patient ambulated these, and they were very comfortable.  I spent 40 minutes with this patient, greater than 50% was face-to-face time counseling regarding the below diagnosis.  ___________________________________________ Itzae Mccurdy J. Ragina Fenter, M.D., ABFM., CAQSM. Primary Care and Sports Medicine [facility]  Adjunct Instructor of Family Medicine  [employer]   

## 2017-04-30 ENCOUNTER — Ambulatory Visit: Payer: 59 | Admitting: Family Medicine

## 2017-04-30 ENCOUNTER — Encounter: Payer: Self-pay | Admitting: Family Medicine

## 2017-04-30 VITALS — BP 106/67 | HR 77 | Temp 98.0°F | Ht 67.0 in | Wt 184.6 lb

## 2017-04-30 DIAGNOSIS — M545 Low back pain, unspecified: Secondary | ICD-10-CM | POA: Insufficient documentation

## 2017-04-30 DIAGNOSIS — R911 Solitary pulmonary nodule: Secondary | ICD-10-CM

## 2017-04-30 MED ORDER — CYCLOBENZAPRINE HCL 5 MG PO TABS: 5.0000 mg | ORAL_TABLET | Freq: Two times a day (BID) | ORAL | 1 refills | Status: DC | PRN

## 2017-04-30 MED ORDER — PREDNISONE 20 MG PO TABS: ORAL_TABLET | ORAL | 0 refills | Status: DC

## 2017-04-30 NOTE — Patient Instructions (Signed)
Rest, heating pad, steroid taper start tomorrow.  In 5 days start stretches.   Sacroiliac Joint Dysfunction Sacroiliac joint dysfunction is a condition that causes inflammation on one or both sides of the sacroiliac (SI) joint. The SI joint connects the lower part of the spine (sacrum) with the two upper portions of the pelvis (ilium). This condition causes deep aching or burning pain in the low back. In some cases, the pain may also spread into one or both buttocks or hips or spread down the legs. What are the causes? This condition may be caused by:  Pregnancy. During pregnancy, extra stress is put on the SI joints because the pelvis widens.  Injury, such as: ? Car accidents. ? Sport-related injuries. ? Work-related injuries.  Having one leg that is shorter than the other.  Conditions that affect the joints, such as: ? Rheumatoid arthritis. ? Gout. ? Psoriatic arthritis. ? Joint infection (septic arthritis).  Sometimes, the cause of SI joint dysfunction is not known. What are the signs or symptoms? Symptoms of this condition include:  Aching or burning pain in the lower back. The pain may also spread to other areas, such as: ? Buttocks. ? Groin. ? Thighs and legs.  Muscle spasms in or around the painful areas.  Increased pain when standing, walking, running, stair climbing, bending, or lifting.  How is this diagnosed? Your health care provider will do a physical exam and take your medical history. During the exam, the health care provider may move one or both of your legs to different positions to check for pain. Various tests may be done to help verify the diagnosis, including:  Imaging tests to look for other causes of pain. These may include: ? MRI. ? CT scan. ? Bone scan.  Diagnostic injection. A numbing medicine is injected into the SI joint using a needle. If the pain is temporarily improved or stopped after the injection, this can indicate that SI joint  dysfunction is the problem.  How is this treated? Treatment may vary depending on the cause and severity of your condition. Treatment options may include:  Applying ice or heat to the lower back area. This can help to reduce pain and muscle spasms.  Medicines to relieve pain or inflammation or to relax the muscles.  Wearing a back brace (sacroiliac brace) to help support the joint while your back is healing.  Physical therapy to increase muscle strength around the joint and flexibility at the joint. This may also involve learning proper body positions and ways of moving to relieve stress on the joint.  Direct manipulation of the SI joint.  Injections of steroid medicine into the joint in order to reduce pain and swelling.  Radiofrequency ablation to burn away nerves that are carrying pain messages from the joint.  Use of a device that provides electrical stimulation in order to reduce pain at the joint.  Surgery to put in screws and plates that limit or prevent joint motion. This is rare.  Follow these instructions at home:  Rest as needed. Limit your activities as directed by your health care provider.  Take medicines only as directed by your health care provider.  If directed, apply ice to the affected area: ? Put ice in a plastic bag. ? Place a towel between your skin and the bag. ? Leave the ice on for 20 minutes, 2-3 times per day.  Use a heating pad or a moist heat pack as directed by your health care provider.  Exercise  as directed by your health care provider or physical therapist.  Keep all follow-up visits as directed by your health care provider. This is important. Contact a health care provider if:  Your pain is not controlled with medicine.  You have a fever.  You have increasingly severe pain. Get help right away if:  You have weakness, numbness, or tingling in your legs or feet.  You lose control of your bladder or bowel. This information is not  intended to replace advice given to you by your health care provider. Make sure you discuss any questions you have with your health care provider. Document Released: 04/21/2008 Document Revised: 07/01/2015 Document Reviewed: 09/30/2013 Elsevier Interactive Patient Education  [employer].

## 2017-04-30 NOTE — Progress Notes (Signed)
[facility] , 11-07-1955, 62 y.o., female MRN: [id number] Patient Care Team    Relationship Specialty Notifications Start End  [name], DO PCP - General Family Medicine  04/05/17    Comment: transfer care to [name], MD Consulting Physician Obstetrics and Gynecology  04/05/17   [name], MD Attending Physician Gastroenterology  04/05/17   [name], [name], [place]  Optometry  04/09/17   [facility]    04/09/17     Chief Complaint  Patient presents with  . Back Pain    Recent ED visit for back pain with CT SCAN done. Pt states she is still have the back pain.     Subjective: Pt presents for an OV with complaints of bilateral lower back pain of 2 weeks duration.  She was seen in the UC, GYN office and ED for this issue over the last 4 days. Pt states she noticed RLQ abd pain on the onset of symptoms. It was a quick fleeting pain only. All other symptoms were around her right lower back. She denies radiation of pain to her lower ext. Pain started right side only, however over last few days has started on the left as well. The pain is worse with crossing her legs (left over right), driving and present with bending. Her work up in the UC (urine) was "no infection." her GYN tx her for BV with flagyl. The ED r/o kidney stone with CT. CT was positive for pulm nodule 18mm left lobe and Mild DDD L5-S1w/ facet arthropathy.   Pt has tried aleve to ease their symptoms.   Pul nodule: Never smoker, lived with a smoker-second hand smoke exposure. [place] with [place].   Depression screen [facility] 2/9 04/05/2017  Decreased Interest 0  Down, Depressed, Hopeless 0  PHQ - 2 Score 0  Altered sleeping 1  Tired, decreased energy 0  Change in appetite 2  Feeling bad or failure about yourself  0  Trouble concentrating 0  Moving slowly or fidgety/restless 0  Suicidal thoughts 0  PHQ-9 Score 3    No Known Allergies Social History   Tobacco Use  .  Smoking status: Never Smoker  . Smokeless tobacco: Never Used  Substance Use Topics  . Alcohol use: Yes    Alcohol/week: 0.0 oz    Comment: Last drink was over a week ago.    Past Medical History:  Diagnosis Date  . Allergy   . Anxiety   . Asthma    in the past- no inhaler currently  . Depression   . Genital warts   . Hay fever   . Migraine   . Stress fracture of right foot    Past Surgical History:  Procedure Laterality Date  . ABDOMINAL HYSTERECTOMY  01/17/2012   Procedure: HYSTERECTOMY ABDOMINAL;  Surgeon: [name], MD;  Location: [facility];  Service: Gynecology;  Laterality: N/A;  . BREAST SURGERY     breast bx  . COLONOSCOPY     11 yr ago with dr [name] ? a few polyps, no report  . KNEE SURGERY     right  . LAPAROSCOPIC ASSISTED VAGINAL HYSTERECTOMY  01/17/2012   Procedure: LAPAROSCOPIC ASSISTED VAGINAL HYSTERECTOMY;  Surgeon: [name], MD;  Location: [facility];  Service: Gynecology;  Laterality: N/A;  attempted   . SALPINGOOPHORECTOMY  01/17/2012   Procedure: SALPINGO OOPHORECTOMY;  Surgeon: [name], MD;  Location: [facility];  Service: Gynecology;  Laterality: Right;   Family History  Problem Relation Age of Onset  . Cancer Mother        pancreatic  . Pancreatic cancer Mother   . Depression Mother   . Cancer Father        basal cell, bladder  . Heart disease Father   . Cancer Sister        melanoma  . Arthritis Sister   . Asthma Sister   . Diabetes Sister   . Hyperlipidemia Sister   . Hypertension Sister   . Kidney disease Sister   . Diabetes Sister   . Hypertension Sister   . Arthritis Sister   . Colon cancer Neg Hx   . Colon polyps Neg Hx   . Esophageal cancer Neg Hx   . Rectal cancer Neg Hx   . Stomach cancer Neg Hx    Allergies as of 04/30/2017   No Known Allergies     Medication List        Accurate as of 04/30/17 11:21 AM. Always use your most recent med list.          multivitamin with minerals Tabs tablet Take 1  tablet by mouth daily.   sertraline 50 MG tablet Commonly known as:  ZOLOFT Take 50 mg by mouth daily.       All past medical history, surgical history, allergies, family history, immunizations andmedications were updated in the EMR today and reviewed under the history and medication portions of their EMR.     ROS: Negative, with the exception of above mentioned in HPI   Objective:  BP 106/67 (BP Location: Right Arm, Patient Position: Sitting, Cuff Size: Normal)   Pulse 77   Temp 98 F (36.7 C) (Oral)   Ht 5\' 7"  (1.702 m)   Wt 184 lb 9.6 oz (83.7 kg)   SpO2 96%   BMI 28.91 kg/m  Body mass index is 28.91 kg/m. Gen: Afebrile. No acute distress. Nontoxic in appearance, well developed, well nourished.  HENT: AT. [place].  MMM Eyes:Pupils Equal Round Reactive to light, Extraocular movements intact,  Conjunctiva without redness, discharge or icterus. CV: RRR  Chest: CTAB, no wheeze or crackles. Good air movement, normal resp effort.  Abd: Soft. NTND. BS present. no Masses palpated. No rebound or guarding.  MSK: no erythema, no soft tissue swelling. No bone TTP lumbar spine. Mild bilateral SI TTP. Mild muscle spasm right lumbar paraspinal, full ROM lumbar spine, + FABRE LEFT (SI pain), SLR left--> SI pain. NV intact distally.  Skin: no rashes, purpura or petechiae.  Neuro:  Normal gait. PERLA. EOMi. Alert. Oriented x3 Cranial nerves II through XII intact. Muscle strength 5/5 bilateral lowerextremity. DTRs equal bilaterally. Psych: Normal affect, dress and demeanor. Normal speech. Normal thought content and judgment.  No exam data present No results found. No results found for this or any previous visit (from the past 24 hour(s)).  Assessment/Plan: [facility] is a 62 y.o. female present for OV for  1. Acute bilateral low back pain without sciatica - suspect SI joint dysfunction. Oddly her exam findings are more + left, pain originated in right but since is bilateral low back  pain. Discussed options with her today. Agreed to prednisone taper 10 days (start tomorrow). IM depo medrol today. Rest , heat. Start stretches provided in 5-7 days, using pain as her guide. If improving, may return to exercise in 2 weeks.  - if pain worsening f/u 2-4 weeks.   pulm nodule- -  discussed CT findings and follow up in July.   Reviewed expectations re: course of current medical issues.  Discussed self-management of symptoms.  Outlined signs and symptoms indicating need for more acute intervention.  Patient verbalized understanding and all questions were answered.  Patient received an After-Visit Summary.    No orders of the defined types were placed in this encounter.    Note is dictated utilizing voice recognition software. Although note has been proof read prior to signing, occasional typographical errors still can be missed. If any questions arise, please do not hesitate to call for verification.   electronically signed by:  Howard Pouch, DO  [place]

## 2017-05-28 ENCOUNTER — Ambulatory Visit: Payer: 59 | Admitting: Sports Medicine

## 2017-05-28 ENCOUNTER — Ambulatory Visit (INDEPENDENT_AMBULATORY_CARE_PROVIDER_SITE_OTHER): Payer: 59

## 2017-05-28 ENCOUNTER — Encounter: Payer: Self-pay | Admitting: Sports Medicine

## 2017-05-28 DIAGNOSIS — M722 Plantar fascial fibromatosis: Secondary | ICD-10-CM | POA: Diagnosis not present

## 2017-05-28 DIAGNOSIS — M545 Low back pain, unspecified: Secondary | ICD-10-CM

## 2017-05-28 DIAGNOSIS — M48061 Spinal stenosis, lumbar region without neurogenic claudication: Secondary | ICD-10-CM | POA: Diagnosis not present

## 2017-05-28 NOTE — Progress Notes (Signed)
Subjective:    I'm seeing this patient as a consultation for: Dr. Howard Pouch  CC: Back pain  HPI: This is a very pleasant 62 year old female, we have been treating her for plantar fasciitis, we did custom orthotics at the last visit, rehab exercises and she is essentially pain-free.  She does bring up some back pain that is been present over the past several years to decades, worse with sitting, flexion, Valsalva, nothing radicular.  Moderate, persistent, no bowel or bladder dysfunction, saddle numbness, constitutional symptoms.  She did get a burst of prednisone which helped temporarily but her pain came back.  I reviewed the past medical history, family history, social history, surgical history, and allergies today and no changes were needed.  Please see the problem list section below in epic for further details.  Past Medical History: Past Medical History:  Diagnosis Date  . Allergy   . Anxiety   . Asthma    in the past- no inhaler currently  . Depression   . Genital warts   . Hay fever   . Migraine   . Stress fracture of right foot    Past Surgical History: Past Surgical History:  Procedure Laterality Date  . ABDOMINAL HYSTERECTOMY  01/17/2012   Procedure: HYSTERECTOMY ABDOMINAL;  Surgeon: [name], MD;  Location: [facility];  Service: Gynecology;  Laterality: N/A;  . BREAST SURGERY     breast bx  . COLONOSCOPY     11 yr ago with dr [name] ? a few polyps, no report  . KNEE SURGERY     right  . LAPAROSCOPIC ASSISTED VAGINAL HYSTERECTOMY  01/17/2012   Procedure: LAPAROSCOPIC ASSISTED VAGINAL HYSTERECTOMY;  Surgeon: [name], MD;  Location: [facility];  Service: Gynecology;  Laterality: N/A;  attempted   . SALPINGOOPHORECTOMY  01/17/2012   Procedure: SALPINGO OOPHORECTOMY;  Surgeon: [name], MD;  Location: [facility];  Service: Gynecology;  Laterality: Right;   Social History: Social History   Socioeconomic History  . Marital status: Married   Spouse name: Not on file  . Number of children: Not on file  . Years of education: Not on file  . Highest education level: Not on file  Occupational History  . Not on file  Social Needs  . Financial resource strain: Not on file  . Food insecurity:    Worry: Not on file    Inability: Not on file  . Transportation needs:    Medical: Not on file    Non-medical: Not on file  Tobacco Use  . Smoking status: Never Smoker  . Smokeless tobacco: Never Used  Substance and Sexual Activity  . Alcohol use: Yes    Alcohol/week: 0.0 oz    Comment: Last drink was over a week ago.   . Drug use: No  . Sexual activity: Never  Lifestyle  . Physical activity:    Days per week: Not on file    Minutes per session: Not on file  . Stress: Not on file  Relationships  . Social connections:    Talks on phone: Not on file    Gets together: Not on file    Attends religious service: Not on file    Active member of club or organization: Not on file    Attends meetings of clubs or organizations: Not on file    Relationship status: Not on file  Other Topics Concern  . Not on file  Social History Narrative   Married.  Bachelor's of arts degree Metallurgist    Wears a bicycle helmet, smoke alarm and home, wears her seatbelt.   Vegetarian, consumes dairy products.   Feels safe in her relationships.   Family History: Family History  Problem Relation Age of Onset  . Cancer Mother        pancreatic  . Pancreatic cancer Mother   . Depression Mother   . Cancer Father        basal cell, bladder  . Heart disease Father   . Cancer Sister        melanoma  . Arthritis Sister   . Asthma Sister   . Diabetes Sister   . Hyperlipidemia Sister   . Hypertension Sister   . Kidney disease Sister   . Diabetes Sister   . Hypertension Sister   . Arthritis Sister   . Colon cancer Neg Hx   . Colon polyps Neg Hx   . Esophageal cancer Neg Hx   . Rectal cancer Neg Hx   . Stomach cancer Neg Hx     Allergies: No Known Allergies Medications: See med rec.  Review of Systems: No headache, visual changes, nausea, vomiting, diarrhea, constipation, dizziness, abdominal pain, skin rash, fevers, chills, night sweats, weight loss, swollen lymph nodes, body aches, joint swelling, muscle aches, chest pain, shortness of breath, mood changes, visual or auditory hallucinations.   Objective:   General: Well Developed, well nourished, and in no acute distress.  Neuro:  Extra-ocular muscles intact, able to move all 4 extremities, sensation grossly intact.  Deep tendon reflexes tested were normal. Psych: Alert and oriented, mood congruent with affect. ENT:  Ears and nose appear unremarkable.  Hearing grossly normal. Neck: Unremarkable overall appearance, trachea midline.  No visible thyroid enlargement. Eyes: Conjunctivae and lids appear unremarkable.  Pupils equal and round. Skin: Warm and dry, no rashes noted.  Cardiovascular: Pulses palpable, no extremity edema. Back Exam:  Inspection: Unremarkable  Motion: Flexion 45 deg, Extension 45 deg, Side Bending to 45 deg bilaterally,  Rotation to 45 deg bilaterally  SLR laying: Negative  XSLR laying: Negative  Palpable tenderness: Tender to palpation over both sacroiliac joints with a negative Patrick's test bilaterally. FABER: negative. Sensory change: Gross sensation intact to all lumbar and sacral dermatomes.  Reflexes: 2+ at both patellar tendons, 2+ at achilles tendons, Babinski's downgoing.  Strength at foot  Plantar-flexion: 5/5 Dorsi-flexion: 5/5 Eversion: 5/5 Inversion: 5/5  Leg strength  Quad: 5/5 Hamstring: 5/5 Hip flexor: 5/5 Hip abductors: 5/5  Gait unremarkable.  Lumbar spine x-rays personally reviewed, she has moderate T11-T12 degenerative disc disease, she also has disc space narrowing at the L5-S1 level with mild retrolisthesis of L5 on S1, as well as mild to moderate multilevel lower lumbar facet arthropathy and  hypertrophy.  Impression and Recommendations:   This case required medical decision making of moderate complexity.  Acute bilateral low back pain without sciatica Axial discogenic back pain. Some tenderness over the right sacroiliac joint. X-rays, formal physical therapy, NSAIDs of choice.   Return in 4 weeks to reevaluate.  Plantar fasciitis, right Completely resolved with rehab exercises, custom molded orthotics. Return as needed for this. ___________________________________________ Gwen Her. Dianah Field, M.D., ABFM., CAQSM. Primary Care and [place] Instructor of [place] [facility]

## 2017-05-28 NOTE — Assessment & Plan Note (Signed)
Axial discogenic back pain. Some tenderness over the right sacroiliac joint. X-rays, formal physical therapy, NSAIDs of choice.   Return in 4 weeks to reevaluate.

## 2017-05-28 NOTE — Assessment & Plan Note (Signed)
Completely resolved with rehab exercises, custom molded orthotics. Return as needed for this.

## 2017-05-29 ENCOUNTER — Encounter: Payer: Self-pay | Admitting: Physical Therapy

## 2017-05-29 ENCOUNTER — Other Ambulatory Visit: Payer: Self-pay

## 2017-05-29 ENCOUNTER — Ambulatory Visit: Payer: 59 | Attending: Sports Medicine | Admitting: Physical Therapy

## 2017-05-29 DIAGNOSIS — M545 Low back pain, unspecified: Secondary | ICD-10-CM

## 2017-05-29 DIAGNOSIS — M62830 Muscle spasm of back: Secondary | ICD-10-CM | POA: Insufficient documentation

## 2017-05-29 NOTE — Therapy (Signed)
[facility] [address] [place], [place], [address] Phone: [phone]   Fax:  [phone]  Physical Therapy Evaluation  Patient Details  Name: Carolyn Gutierrez MRN: [id number] Date of Birth: [birth year] Referring Provider: Dr. Darene Lamer   Encounter Date: 05/29/2017  PT End of Session - 05/29/17 1101    Visit Number  1    Date for PT Re-Evaluation  07/29/17    PT Start Time  4580    PT Stop Time  1100    PT Time Calculation (min)  45 min    Activity Tolerance  Patient tolerated treatment well    Behavior During Therapy  [facility] for tasks assessed/performed       Past Medical History:  Diagnosis Date  . Allergy   . Anxiety   . Asthma    in the past- no inhaler currently  . Depression   . Genital warts   . Hay fever   . Migraine   . Stress fracture of right foot     Past Surgical History:  Procedure Laterality Date  . ABDOMINAL HYSTERECTOMY  01/17/2012   Procedure: HYSTERECTOMY ABDOMINAL;  Surgeon: [name], MD;  Location: [facility];  Service: Gynecology;  Laterality: N/A;  . BREAST SURGERY     breast bx  . COLONOSCOPY     11 yr ago with dr [name] ? a few polyps, no report  . KNEE SURGERY     right  . LAPAROSCOPIC ASSISTED VAGINAL HYSTERECTOMY  01/17/2012   Procedure: LAPAROSCOPIC ASSISTED VAGINAL HYSTERECTOMY;  Surgeon: [name], MD;  Location: [facility];  Service: Gynecology;  Laterality: N/A;  attempted   . SALPINGOOPHORECTOMY  01/17/2012   Procedure: SALPINGO OOPHORECTOMY;  Surgeon: [name], MD;  Location: [facility];  Service: Gynecology;  Laterality: Right;    There were no vitals filed for this visit.   Subjective Assessment - 05/29/17 1011    Subjective  Patient reports about 4-6 weeks on right low back pain.  Unsure of a cause, she reports x-rays show DDD and some arthritic changes and stenosis.  She reports a dose pack of prednisone helped but once she stopped no help with pain over  all    Limitations  Sitting    Patient Stated Goals  have less pain    Currently in Pain?  Yes    Pain Score  5     Pain Location  Back    Pain Orientation  Right;Lower    Pain Descriptors / Indicators  Aching;Nagging;Constant    Pain Type  Acute pain    Pain Radiating Towards  denies    Pain Onset  More than a month ago    Pain Frequency  Constant    Aggravating Factors   sitting long periods, pain up to 9/10, reports that she has stopped doing a lot of things due to fear of pain    Pain Relieving Factors  heat helps, lumbar support at best pain a 4/10    Effect of Pain on Daily Activities  limits everything due to fear of pain         [facility] PT Assessment - 05/29/17 0001      Assessment   Medical Diagnosis  LBP    Referring Provider  Dr. Darene Lamer    Onset Date/Surgical Date  04/28/17    Prior Therapy  no      Precautions   Precautions  None  Balance Screen   Has the patient fallen in the past 6 months  No    Has the patient had a decrease in activity level because of a fear of falling?   No    Is the patient reluctant to leave their home because of a fear of falling?   No      Home Environment   Additional Comments  normally would do her own housework, stairs,       Prior Function   Level of Independence  Independent    Vocation  Unemployed;Retired    Leisure  walks 20 minutes 2x/week      Posture/Postural Control   Posture Comments  fwd head, rounded shoulders, weak core      ROM / Strength   AROM / PROM / Strength  AROM;Strength      AROM   Overall AROM Comments  Lumbar ROM flexion was WFL's. extension and side bending was decreased 25% with some increased pain and pinching in the right low back      Strength   Overall Strength Comments  4-/5 for the hips with some pain in the right low back      Flexibility   Soft Tissue Assessment /Muscle Length  yes    Hamstrings  some tightness with pain in the right low back with right HS    Quadriceps  tight     Piriformis  mild tightness with some right low back pain      Palpation   Palpation comment  right leg seemed to be slightly longer in supine, she is very tight in the lumbar area, she has a very tender area in the right buttock seems to be glute medius, she reported "that is it"                Objective measurements completed on examination: See above findings.      [facility] Adult PT Treatment/Exercise - 05/29/17 0001      Modalities   Modalities  Electrical Stimulation;Moist Heat      Moist Heat Therapy   Number Minutes Moist Heat  15 Minutes    Moist Heat Location  Lumbar Spine      Electrical Stimulation   Electrical Stimulation Location  right SI/buttock area    Electrical Stimulation Action  IFC    Electrical Stimulation Parameters  supine    Electrical Stimulation Goals  Pain             PT Education - 05/29/17 1100    Education provided  Yes    Education Details  Wms flexion, and piriformis stretch    Person(s) Educated  Patient    Methods  Explanation;Demonstration;Handout    Comprehension  Verbalized understanding       PT Short Term Goals - 05/29/17 1106      PT SHORT TERM GOAL #1   Title  independent with initial HEP    Time  2    Period  Weeks    Status  New        PT Long Term Goals - 05/29/17 1106      PT LONG TERM GOAL #1   Title  decrease pain 50%    Time  2    Period  Weeks    Status  New      PT LONG TERM GOAL #2   Title  increase ROM to WNL's    Time  8    Period  Weeks    Status  New      PT LONG TERM GOAL #3   Title  resume walking for exercise    Time  8    Period  Weeks    Status  New      PT LONG TERM GOAL #4   Title  understand proper posture and body mechanics    Time  8    Period  Weeks    Status  New             Plan - 05/29/17 1101    Clinical Impression Statement  Patient reports that she started having right low back pain about 6 weeks ago, reports that she is unsure of a cause.  She had  x-rays that showed DDD and stenosis.  She has tightness in the lumbar area, she has some limitations with extension and side bending, she has a very tender area in the right gluteal area.,  she reports that she has stopped almost all of her activities due to fear of pain    Clinical Presentation  Stable    Clinical Decision Making  Low    Rehab Potential  Good    PT Frequency  2x / week    PT Duration  8 weeks    PT Treatment/Interventions  ADLs/Self Care Home Management;Cryotherapy;Electrical Stimulation;Moist Heat;Traction;Ultrasound;Neuromuscular re-education;Therapeutic exercise;Therapeutic activities;Functional mobility training;Patient/family education;Manual techniques;Dry needling    PT Next Visit Plan  Patient will be on vacation next week, try to work on the gluteal spasms and core strength    Consulted and Agree with Plan of Care  Patient       Patient will benefit from skilled therapeutic intervention in order to improve the following deficits and impairments:  Decreased range of motion, Increased muscle spasms, Pain, Decreased activity tolerance, Impaired flexibility, Improper body mechanics, Postural dysfunction, Decreased strength, Decreased mobility  Visit Diagnosis: Acute right-sided low back pain without sciatica - Plan: PT plan of care cert/re-cert  Muscle spasm of back - Plan: PT plan of care cert/re-cert     Problem List Patient Active Problem List   Diagnosis Date Noted  . Pulmonary nodule 04/30/2017  . Acute bilateral low back pain without sciatica 04/30/2017  . Plantar fasciitis, right 04/09/2017  . Osteopenia 07/07/2015  . Headache, migraine 03/30/2015  . Delusional disorder ([place]) 11/17/2013    Sumner Boast., PT 05/29/2017, 11:10 AM  [address] [place], [place], [address] Phone: [phone]   Fax:  [phone]  Name: Carolyn Gutierrez MRN: [id number] Date of Birth:  1956-01-20

## 2017-05-31 ENCOUNTER — Ambulatory Visit: Payer: 59 | Admitting: Physical Therapy

## 2017-05-31 ENCOUNTER — Encounter: Payer: Self-pay | Admitting: Physical Therapy

## 2017-05-31 DIAGNOSIS — M545 Low back pain, unspecified: Secondary | ICD-10-CM

## 2017-05-31 DIAGNOSIS — M62830 Muscle spasm of back: Secondary | ICD-10-CM

## 2017-05-31 NOTE — Therapy (Signed)
[facility] [address], [place], [address] Phone: [phone]   Fax:  [phone]  Physical Therapy Treatment  Patient Details  Name: Carolyn Gutierrez MRN: [id number] Date of Birth: 11-Jul-1955 Referring Provider: Dr. Darene Lamer   Encounter Date: 05/31/2017  PT End of Session - 05/31/17 0806    Visit Number  2    Date for PT Re-Evaluation  07/29/17    PT Start Time  0803    PT Stop Time  0900    PT Time Calculation (min)  57 min    Activity Tolerance  Patient tolerated treatment well    Behavior During Therapy  [facility] for tasks assessed/performed       Past Medical History:  Diagnosis Date  . Allergy   . Anxiety   . Asthma    in the past- no inhaler currently  . Depression   . Genital warts   . Hay fever   . Migraine   . Stress fracture of right foot     Past Surgical History:  Procedure Laterality Date  . ABDOMINAL HYSTERECTOMY  01/17/2012   Procedure: HYSTERECTOMY ABDOMINAL;  Surgeon: [name], MD;  Location: [facility];  Service: Gynecology;  Laterality: N/A;  . BREAST SURGERY     breast bx  . COLONOSCOPY     11 yr ago with dr [name] ? a few polyps, no report  . KNEE SURGERY     right  . LAPAROSCOPIC ASSISTED VAGINAL HYSTERECTOMY  01/17/2012   Procedure: LAPAROSCOPIC ASSISTED VAGINAL HYSTERECTOMY;  Surgeon: [name], MD;  Location: [facility];  Service: Gynecology;  Laterality: N/A;  attempted   . SALPINGOOPHORECTOMY  01/17/2012   Procedure: SALPINGO OOPHORECTOMY;  Surgeon: [name], MD;  Location: [facility];  Service: Gynecology;  Laterality: Right;    There were no vitals filed for this visit.  Subjective Assessment - 05/31/17 0804    Subjective  felt well after estim last visit - still has contant R sided LBP    Patient Stated Goals  have less pain    Currently in Pain?  Yes    Pain Score  5     Pain Orientation  Right;Lower    Pain Descriptors / Indicators   Aching;Constant;Sore    Pain Type  Acute pain                       [facility] Adult PT Treatment/Exercise - 05/31/17 0808      Exercises   Exercises  Lumbar      Lumbar Exercises: Aerobic   Recumbent Bike  L1 x 7 min      Lumbar Exercises: Standing   Functional Squats  15 reps    Functional Squats Limitations  B UE support at counter    Wall Slides  10 reps;3 seconds    Row  Strengthening;Both;15 reps;Theraband    Theraband Level (Row)  Level 3 (Green)      Lumbar Exercises: Supine   Pelvic Tilt  10 reps    Pelvic Tilt Limitations  VC for correct form and use of abdominal mm.     Clam  10 reps    Clam Limitations  + ab set; VC for form    Bent Knee Raise  10 reps    Bent Knee Raise Limitations  + ab set    Bridge  10 reps    Isometric Hip Flexion  10 reps;5 seconds  Isometric Hip Flexion Limitations  feet resting on ball      Lumbar Exercises: Quadruped   Madcat/Old Horse  10 reps    Other Quadruped Lumbar Exercises  childs pose - 3 x 30 sec    Other Quadruped Lumbar Exercises  hip extension - target LE to floor x 12 reps each LE      Modalities   Modalities  Electrical Stimulation;Moist Heat      Moist Heat Therapy   Number Minutes Moist Heat  15 Minutes    Moist Heat Location  Lumbar Spine      Electrical Stimulation   Electrical Stimulation Location  right SI/buttock area    Electrical Stimulation Action  IFC     Electrical Stimulation Parameters  supine - to tolerance    Electrical Stimulation Goals  Pain      Manual Therapy   Manual therapy comments  education for self massage with ball against wall x 3 min               PT Short Term Goals - 05/31/17 0806      PT SHORT TERM GOAL #1   Title  independent with initial HEP    Time  2    Period  Weeks    Status  On-going        PT Long Term Goals - 05/31/17 0865      PT LONG TERM GOAL #1   Title  decrease pain 50%    Time  2    Period  Weeks    Status  On-going      PT  LONG TERM GOAL #2   Title  increase ROM to WNL's    Time  8    Period  Weeks    Status  On-going      PT LONG TERM GOAL #3   Title  resume walking for exercise    Time  8    Period  Weeks    Status  On-going      PT LONG TERM GOAL #4   Title  understand proper posture and body mechanics    Time  8    Period  Weeks    Status  On-going            Plan - 05/31/17 0806    Clinical Impression Statement  Patient doing well today - does continue to report continued pain most exacerbated with prolonged sitting and picking items up from floor with any amount of weight. PT session today focusing on core and glute activation with good tolerance. Does require consistent VC to ensure proper mechanics for desired muscular activation/motor control. Will continue to progress towards goals. PTA providing education regaridng home TENS unit.     PT Treatment/Interventions  ADLs/Self Care Home Management;Cryotherapy;Electrical Stimulation;Moist Heat;Traction;Ultrasound;Neuromuscular re-education;Therapeutic exercise;Therapeutic activities;Functional mobility training;Patient/family education;Manual techniques;Dry needling    Consulted and Agree with Plan of Care  Patient       Patient will benefit from skilled therapeutic intervention in order to improve the following deficits and impairments:  Decreased range of motion, Increased muscle spasms, Pain, Decreased activity tolerance, Impaired flexibility, Improper body mechanics, Postural dysfunction, Decreased strength, Decreased mobility  Visit Diagnosis: Acute right-sided low back pain without sciatica  Muscle spasm of back     Problem List Patient Active Problem List   Diagnosis Date Noted  . Pulmonary nodule 04/30/2017  . Acute bilateral low back pain without sciatica 04/30/2017  . Plantar fasciitis, right 04/09/2017  .  Osteopenia 07/07/2015  . Headache, migraine 03/30/2015  . Delusional disorder ([place]) 11/17/2013     Lanney Gins, PT, DPT 05/31/17 9:25 AM   [address] [place], [place], [address] Phone: [phone]   Fax:  [phone]  Name: Gwendloyn Forsee MRN: [id number] Date of Birth: 1955-04-07

## 2017-06-12 ENCOUNTER — Ambulatory Visit: Payer: 59 | Attending: Sports Medicine | Admitting: Physical Therapy

## 2017-06-12 ENCOUNTER — Encounter: Payer: Self-pay | Admitting: Physical Therapy

## 2017-06-12 DIAGNOSIS — M545 Low back pain, unspecified: Secondary | ICD-10-CM

## 2017-06-12 DIAGNOSIS — M62830 Muscle spasm of back: Secondary | ICD-10-CM

## 2017-06-12 NOTE — Therapy (Signed)
[place] [place], [place], [address] Phone: [phone]   Fax:  [phone]  Physical Therapy Treatment  Patient Details  Name: Carolyn Gutierrez MRN: [id number] Date of Birth: 1955/08/22 Referring Provider: Dr. Darene Lamer   Encounter Date: 06/12/2017  PT End of Session - 06/12/17 1202    Visit Number  3    Date for PT Re-Evaluation  07/29/17    PT Start Time  1058    PT Stop Time  1145    PT Time Calculation (min)  47 min    Activity Tolerance  Patient tolerated treatment well    Behavior During Therapy  [facility] for tasks assessed/performed       Past Medical History:  Diagnosis Date  . Allergy   . Anxiety   . Asthma    in the past- no inhaler currently  . Depression   . Genital warts   . Hay fever   . Migraine   . Stress fracture of right foot     Past Surgical History:  Procedure Laterality Date  . ABDOMINAL HYSTERECTOMY  01/17/2012   Procedure: HYSTERECTOMY ABDOMINAL;  Surgeon: [name], MD;  Location: [facility];  Service: Gynecology;  Laterality: N/A;  . BREAST SURGERY     breast bx  . COLONOSCOPY     11 yr ago with dr [name] ? a few polyps, no report  . KNEE SURGERY     right  . LAPAROSCOPIC ASSISTED VAGINAL HYSTERECTOMY  01/17/2012   Procedure: LAPAROSCOPIC ASSISTED VAGINAL HYSTERECTOMY;  Surgeon: [name], MD;  Location: [facility];  Service: Gynecology;  Laterality: N/A;  attempted   . SALPINGOOPHORECTOMY  01/17/2012   Procedure: SALPINGO OOPHORECTOMY;  Surgeon: [name], MD;  Location: [facility];  Service: Gynecology;  Laterality: Right;    There were no vitals filed for this visit.  Subjective Assessment - 06/12/17 1156    Subjective  Patient reports that she is feeling very good, wants to focus today on what to do to be health with exercises    Currently in Pain?  No/denies                       [facility] Adult PT Treatment/Exercise - 06/12/17 0001       Self-Care   Self-Care  Other Self-Care Comments    Other Self-Care Comments   Spent the session with patient ansering many questions about osteoporosis, the effects of exercise, gym vs. home, weights, reps, sets, frequency, form, posture, exercises to avoid etc..  We did a lot of different exercises and she was critiqued and corrected for form and she felt good and was able to return demo             PT Education - 06/12/17 1201    Education provided  Yes    Education Details  red and green tband for UE and LE HEP    Person(s) Educated  Patient    Methods  Explanation;Demonstration;Tactile cues;Verbal cues;Handout    Comprehension  Verbalized understanding;Returned demonstration       PT Short Term Goals - 06/12/17 1204      PT SHORT TERM GOAL #1   Title  independent with initial HEP    Status  Achieved        PT Long Term Goals - 06/12/17 1204      PT LONG TERM GOAL #1   Title  decrease pain  50%    Status  Achieved      PT LONG TERM GOAL #2   Title  increase ROM to WNL's    Status  Achieved      PT LONG TERM GOAL #3   Title  resume walking for exercise    Status  Achieved      PT LONG TERM GOAL #4   Title  understand proper posture and body mechanics    Status  Achieved            Plan - 06/12/17 1202    Clinical Impression Statement  Patient wanting to discusss all the options for her after PT and talks about gym vs home exercise.  I answered all of her questions and we demoed the exercises and had her perform with cues to correct, did a lot of education about safety and how to preogress    PT Next Visit Plan  D/ C all goals met    Consulted and Agree with Plan of Care  Patient       Patient will benefit from skilled therapeutic intervention in order to improve the following deficits and impairments:  Decreased range of motion, Increased muscle spasms, Pain, Decreased activity tolerance, Impaired flexibility, Improper body mechanics, Postural  dysfunction, Decreased strength, Decreased mobility  Visit Diagnosis: Acute right-sided low back pain without sciatica  Muscle spasm of back     Problem List Patient Active Problem List   Diagnosis Date Noted  . Pulmonary nodule 04/30/2017  . Acute bilateral low back pain without sciatica 04/30/2017  . Plantar fasciitis, right 04/09/2017  . Osteopenia 07/07/2015  . Headache, migraine 03/30/2015  . Delusional disorder ([place]) 11/17/2013    Sumner Boast., PT 06/12/2017, 12:05 PM  [address] [place], [place], [address] Phone: [phone]   Fax:  [phone]  Name: Carolyn Gutierrez MRN: [id number] Date of Birth: 24-Mar-1955

## 2017-06-14 ENCOUNTER — Ambulatory Visit: Payer: 59 | Admitting: Physical Therapy

## 2017-06-25 ENCOUNTER — Ambulatory Visit: Payer: Self-pay | Admitting: Sports Medicine

## 2017-07-23 DIAGNOSIS — J019 Acute sinusitis, unspecified: Secondary | ICD-10-CM | POA: Diagnosis not present

## 2017-08-14 ENCOUNTER — Encounter: Payer: Self-pay | Admitting: Family Medicine

## 2017-08-14 ENCOUNTER — Ambulatory Visit: Payer: 59 | Admitting: Family Medicine

## 2017-08-14 VITALS — BP 139/78 | HR 70 | Temp 97.8°F | Resp 20 | Ht 67.0 in | Wt 182.5 lb

## 2017-08-14 DIAGNOSIS — R42 Dizziness and giddiness: Secondary | ICD-10-CM | POA: Diagnosis not present

## 2017-08-14 NOTE — Progress Notes (Signed)
Carolyn Gutierrez , 1955/12/12, 62 y.o., female MRN: [id number] Patient Care Team    Relationship Specialty Notifications Start End  Carolyn Hillock, DO PCP - General Family Medicine  04/05/17    Comment: transfer care to Carolyn Mulligan, MD Consulting Physician Obstetrics and Gynecology  04/05/17   Carolyn Banister, MD Attending Physician Gastroenterology  04/05/17   [name], Carolyn, Gutierrez  Optometry  04/09/17   [facility]    04/09/17     Chief Complaint  Patient presents with  . Dizziness     Subjective: Pt presents for an OV with complaints of dizziness of 2 weeks duration.  Associated symptoms include sinus infection that started at the same time his dizziness treated with prednisone and amoxicillin.  She has a very mild cough that remains nonproductive.  She reports the dizziness occurs mostly when bending over and standing.  She reports not drinking much water over the last few weeks.  She is not using an antihistamine.  She had been using Flonase for little bit but stopped using.   Depression screen [facility] 2/9 04/05/2017  Decreased Interest 0  Down, Depressed, Hopeless 0  PHQ - 2 Score 0  Altered sleeping 1  Tired, decreased energy 0  Change in appetite 2  Feeling bad or failure about yourself  0  Trouble concentrating 0  Moving slowly or fidgety/restless 0  Suicidal thoughts 0  PHQ-9 Score 3    No Known Allergies Social History   Tobacco Use  . Smoking status: Never Smoker  . Smokeless tobacco: Never Used  Substance Use Topics  . Alcohol use: Yes    Alcohol/week: 0.0 oz    Comment: Last drink was over a week ago.    Past Medical History:  Diagnosis Date  . Allergy   . Anxiety   . Asthma    in the past- no inhaler currently  . Depression   . Genital warts   . Hay fever   . Migraine   . Stress fracture of right foot    Past Surgical History:  Procedure Laterality Date  . ABDOMINAL HYSTERECTOMY  01/17/2012   Procedure: HYSTERECTOMY ABDOMINAL;   Surgeon: Carolyn Mourning, MD;  Location: [facility];  Service: Gynecology;  Laterality: N/A;  . BREAST SURGERY     breast bx  . COLONOSCOPY     11 yr ago with dr Carolyn Gutierrez ? a few polyps, no report  . KNEE SURGERY     right  . LAPAROSCOPIC ASSISTED VAGINAL HYSTERECTOMY  01/17/2012   Procedure: LAPAROSCOPIC ASSISTED VAGINAL HYSTERECTOMY;  Surgeon: Carolyn Mourning, MD;  Location: [facility];  Service: Gynecology;  Laterality: N/A;  attempted   . SALPINGOOPHORECTOMY  01/17/2012   Procedure: SALPINGO OOPHORECTOMY;  Surgeon: Carolyn Mourning, MD;  Location: [facility];  Service: Gynecology;  Laterality: Right;   Family History  Problem Relation Age of Onset  . Cancer Mother        pancreatic  . Pancreatic cancer Mother   . Depression Mother   . Cancer Father        basal cell, bladder  . Heart disease Father   . Cancer Sister        melanoma  . Arthritis Sister   . Asthma Sister   . Diabetes Sister   . Hyperlipidemia Sister   . Hypertension Sister   . Kidney disease Sister   . Diabetes Sister   . Hypertension Sister   . Arthritis Sister   .  Colon cancer Neg Hx   . Colon polyps Neg Hx   . Esophageal cancer Neg Hx   . Rectal cancer Neg Hx   . Stomach cancer Neg Hx    Allergies as of 08/14/2017   No Known Allergies     Medication List        Accurate as of 08/14/17  1:23 PM. Always use your most recent med list.          multivitamin with minerals Tabs tablet Take 1 tablet by mouth daily.   OVER THE COUNTER MEDICATION CBD oil   sertraline 50 MG tablet Commonly known as:  ZOLOFT Take 50 mg by mouth daily.       All past medical history, surgical history, allergies, family history, immunizations andmedications were updated in the EMR today and reviewed under the history and medication portions of their EMR.     ROS: Negative, with the exception of above mentioned in HPI   Objective:  BP 139/78 (BP Location: Right Arm, Patient Position: Standing, Cuff Size: Normal)    Pulse 70   Temp 97.8 F (36.6 C)   Resp 20   Ht 5\' 7"  (1.702 m)   Wt 182 lb 8 oz (82.8 kg)   SpO2 97%   BMI 28.58 kg/m  Body mass index is 28.58 kg/m. Gen: Afebrile. No acute distress. Nontoxic in appearance, well developed, well nourished.  HENT: AT. [place]. Bilateral TM visualized mild fullness left TM, but no erythema or bulging.. MMM, no oral lesions. Bilateral nares without erythema, drainage or swelling. Throat without erythema or exudates.  Mild cough.  Very mild hoarseness.  No sinus pressure to palpation. Eyes:Pupils Equal Round Reactive to light, Extraocular movements intact,  Conjunctiva without redness, discharge or icterus. Neck/lymp/endocrine: Supple, no lymphadenopathy CV: RRR  Chest: CTAB, no wheeze or crackles. Good air movement, normal resp effort.  Abd: Soft. NTND. BS positive. Skin: No rashes, purpura or petechiae.  Neuro:  Normal gait. PERLA. EOMi. Alert. Oriented x3  Psych: Normal affect, dress and demeanor. Normal speech. Normal thought content and judgment.  No exam data present No results found. No results found for this or any previous visit (from the past 24 hour(s)).  Assessment/Plan: Carolyn Gutierrez is a 62 y.o. female present for OV for  Vertigo -Be secondary to recent sinus infection, she does not appear infectious on exam today. - Orthostatics are positive today.  Encourage patient to drink at least 80 ounces of water a day. -Restart Flonase daily.  Considered collecting CBC and CMP, however elected to wait to her physical which is in just a few days to get all labs collected at same time. -Orthostatic hypotension and vertigo. -Follow-up next week during her physical exam.   Reviewed expectations re: course of current medical issues.  Discussed self-management of symptoms.  Outlined signs and symptoms indicating need for more acute intervention.  Patient verbalized understanding and all questions were answered.  Patient received an After-Visit  Summary.    No orders of the defined types were placed in this encounter.    Note is dictated utilizing voice recognition software. Although note has been proof read prior to signing, occasional typographical errors still can be missed. If any questions arise, please do not hesitate to call for verification.   electronically signed by:  Howard Pouch, DO  [place]

## 2017-08-14 NOTE — Patient Instructions (Addendum)
Hydrate, continue Flonase.   Orthostatic Hypotension Orthostatic hypotension is a sudden drop in blood pressure that happens when you quickly change positions, such as when you get up from a seated or lying position. Blood pressure is a measurement of how strongly, or weakly, your blood is pressing against the walls of your arteries. Arteries are blood vessels that carry blood from your heart throughout your body. When blood pressure is too low, you may not get enough blood to your brain or to the rest of your organs. This can cause weakness, light-headedness, rapid heartbeat, and fainting. This can last for just a few seconds or for up to a few minutes. Orthostatic hypotension is usually not a serious problem. However, if it happens frequently or gets worse, it may be a sign of something more serious. What are the causes? This condition may be caused by:  Sudden changes in posture, such as standing up quickly after you have been sitting or lying down.  Blood loss.  Loss of body fluids (dehydration).  Heart problems.  Hormone (endocrine) problems.  Pregnancy.  Severe infection.  Lack of certain nutrients.  Severe allergic reactions (anaphylaxis).  Certain medicines, such as blood pressure medicine or medicines that make the body lose excess fluids (diuretics). Sometimes, this condition can be caused by not taking medicine as directed, such as taking too much of a certain medicine.  What increases the risk? Certain factors can make you more likely to develop orthostatic hypotension, including:  Age. Risk increases as you get older.  Conditions that affect the heart or the central nervous system.  Taking certain medicines, such as blood pressure medicine or diuretics.  Being pregnant.  What are the signs or symptoms? Symptoms of this condition may include:  Weakness.  Light-headedness.  Dizziness.  Blurred vision.  Fatigue.  Rapid heartbeat.  Fainting, in severe  cases.  How is this diagnosed? This condition is diagnosed based on:  Your medical history.  Your symptoms.  Your blood pressure measurement. Your health care provider will check your blood pressure when you are: ? Lying down. ? Sitting. ? Standing.  A blood pressure reading is recorded as two numbers, such as "120 over 80" (or 120/80). The first ("top") number is called the systolic pressure. It is a measure of the pressure in your arteries as your heart beats. The second ("bottom") number is called the diastolic pressure. It is a measure of the pressure in your arteries when your heart relaxes between beats. Blood pressure is measured in a unit called mm Hg. Healthy blood pressure for adults is 120/80. If your blood pressure is below 90/60, you may be diagnosed with hypotension. Other information or tests that may be used to diagnose orthostatic hypotension include:  Your other vital signs, such as your heart rate and temperature.  Blood tests.  Tilt table test. For this test, you will be safely secured to a table that moves you from a lying position to an upright position. Your heart rhythm and blood pressure will be monitored during the test.  How is this treated? Treatment for this condition may include:  Changing your diet. This may involve eating more salt (sodium) or drinking more water.  Taking medicines to raise your blood pressure.  Changing the dosage of certain medicines you are taking that might be lowering your blood pressure.  Wearing compression stockings. These stockings help to prevent blood clots and reduce swelling in your legs.  In some cases, you may need to go  to the hospital for:  Fluid replacement. This means you will receive fluids through an IV tube.  Blood replacement. This means you will receive donated blood through an IV tube (transfusion).  Treating an infection or heart problems, if this applies.  Monitoring. You may need to be monitored  while medicines that you are taking wear off.  Follow these instructions at home: Eating and drinking   Drink enough fluid to keep your urine clear or pale yellow.  Eat a healthy diet and follow instructions from your health care provider about eating or drinking restrictions. A healthy diet includes: ? Fresh fruits and vegetables. ? Whole grains. ? Lean meats. ? Low-fat dairy products.  Eat extra salt only as directed. Do not add extra salt to your diet unless your health care provider told you to do that.  Eat frequent, small meals.  Avoid standing up suddenly after eating. Medicines  Take over-the-counter and prescription medicines only as told by your health care provider. ? Follow instructions from your health care provider about changing the dosage of your current medicines, if this applies. ? Do not stop or adjust any of your medicines on your own. General instructions  Wear compression stockings as told by your health care provider.  Get up slowly from lying down or sitting positions. This gives your blood pressure a chance to adjust.  Avoid hot showers and excessive heat as directed by your health care provider.  Return to your normal activities as told by your health care provider. Ask your health care provider what activities are safe for you.  Do not use any products that contain nicotine or tobacco, such as cigarettes and e-cigarettes. If you need help quitting, ask your health care provider.  Keep all follow-up visits as told by your health care provider. This is important. Contact a health care provider if:  You vomit.  You have diarrhea.  You have a fever for more than 2-3 days.  You feel more thirsty than usual.  You feel weak and tired. Get help right away if:  You have chest pain.  You have a fast or irregular heartbeat.  You develop numbness in any part of your body.  You cannot move your arms or your legs.  You have trouble  speaking.  You become sweaty or feel lightheaded.  You faint.  You feel short of breath.  You have trouble staying awake.  You feel confused. This information is not intended to replace advice given to you by your health care provider. Make sure you discuss any questions you have with your health care provider. Document Released: 01/13/2002 Document Revised: 10/12/2015 Document Reviewed: 07/16/2015 Elsevier Interactive Patient Education  2018 [employer].    Vertigo Vertigo means that you feel like you are moving when you are not. Vertigo can also make you feel like things around you are moving when they are not. This feeling can come and go at any time. Vertigo often goes away on its own. Follow these instructions at home:  Avoid making fast movements.  Avoid driving.  Avoid using heavy machinery.  Avoid doing any task or activity that might cause danger to you or other people if you would have a vertigo attack while you are doing it.  Sit down right away if you feel dizzy or have trouble with your balance.  Take over-the-counter and prescription medicines only as told by your doctor.  Follow instructions from your doctor about which positions or movements you should avoid.  Drink enough fluid to keep your pee (urine) clear or pale yellow.  Keep all follow-up visits as told by your doctor. This is important. Contact a doctor if:  Medicine does not help your vertigo.  You have a fever.  Your problems get worse or you have new symptoms.  Your family or friends see changes in your behavior.  You feel sick to your stomach (nauseous) or you throw up (vomit).  You have a "pins and needles" feeling or you are numb in part of your body. Get help right away if:  You have trouble moving or talking.  You are always dizzy.  You pass out (faint).  You get very bad headaches.  You feel weak or have trouble using your hands, arms, or legs.  You have changes in your  hearing.  You have changes in your seeing (vision).  You get a stiff neck.  Bright light starts to bother you. This information is not intended to replace advice given to you by your health care provider. Make sure you discuss any questions you have with your health care provider. Document Released: 11/02/2007 Document Revised: 07/01/2015 Document Reviewed: 05/18/2014 Elsevier Interactive Patient Education  [employer].

## 2017-08-15 ENCOUNTER — Encounter: Payer: Self-pay | Admitting: Family Medicine

## 2017-08-15 DIAGNOSIS — R42 Dizziness and giddiness: Secondary | ICD-10-CM | POA: Insufficient documentation

## 2017-08-20 ENCOUNTER — Ambulatory Visit (INDEPENDENT_AMBULATORY_CARE_PROVIDER_SITE_OTHER): Payer: 59 | Admitting: Family Medicine

## 2017-08-20 ENCOUNTER — Encounter: Payer: Self-pay | Admitting: Family Medicine

## 2017-08-20 VITALS — BP 120/79 | HR 70 | Temp 98.2°F | Resp 16 | Ht 67.25 in | Wt 184.0 lb

## 2017-08-20 DIAGNOSIS — E663 Overweight: Secondary | ICD-10-CM | POA: Diagnosis not present

## 2017-08-20 DIAGNOSIS — R911 Solitary pulmonary nodule: Secondary | ICD-10-CM

## 2017-08-20 DIAGNOSIS — F22 Delusional disorders: Secondary | ICD-10-CM

## 2017-08-20 DIAGNOSIS — Z79899 Other long term (current) drug therapy: Secondary | ICD-10-CM | POA: Diagnosis not present

## 2017-08-20 DIAGNOSIS — R918 Other nonspecific abnormal finding of lung field: Secondary | ICD-10-CM

## 2017-08-20 DIAGNOSIS — Z114 Encounter for screening for human immunodeficiency virus [HIV]: Secondary | ICD-10-CM

## 2017-08-20 DIAGNOSIS — Z131 Encounter for screening for diabetes mellitus: Secondary | ICD-10-CM | POA: Diagnosis not present

## 2017-08-20 DIAGNOSIS — Z0001 Encounter for general adult medical examination with abnormal findings: Secondary | ICD-10-CM | POA: Diagnosis not present

## 2017-08-20 DIAGNOSIS — Z23 Encounter for immunization: Secondary | ICD-10-CM | POA: Diagnosis not present

## 2017-08-20 DIAGNOSIS — R42 Dizziness and giddiness: Secondary | ICD-10-CM

## 2017-08-20 DIAGNOSIS — Z1159 Encounter for screening for other viral diseases: Secondary | ICD-10-CM | POA: Diagnosis not present

## 2017-08-20 LAB — POC URINALSYSI DIPSTICK (AUTOMATED)
BILIRUBIN UA: NEGATIVE
Blood, UA: NEGATIVE
GLUCOSE UA: NEGATIVE
KETONES UA: NEGATIVE
LEUKOCYTES UA: NEGATIVE
Nitrite, UA: NEGATIVE
Protein, UA: NEGATIVE
Urobilinogen, UA: 0.2 E.U./dL
pH, UA: 7.5 (ref 5.0–8.0)

## 2017-08-20 LAB — CBC WITH DIFFERENTIAL/PLATELET
BASOS ABS: 0 10*3/uL (ref 0.0–0.1)
Basophils Relative: 0.7 % (ref 0.0–3.0)
EOS ABS: 0.2 10*3/uL (ref 0.0–0.7)
Eosinophils Relative: 3.1 % (ref 0.0–5.0)
HCT: 41.8 % (ref 36.0–46.0)
Hemoglobin: 14.3 g/dL (ref 12.0–15.0)
LYMPHS ABS: 1.4 10*3/uL (ref 0.7–4.0)
Lymphocytes Relative: 22.3 % (ref 12.0–46.0)
MCHC: 34.1 g/dL (ref 30.0–36.0)
MCV: 95.5 fl (ref 78.0–100.0)
Monocytes Absolute: 0.5 10*3/uL (ref 0.1–1.0)
Monocytes Relative: 7 % (ref 3.0–12.0)
NEUTROS ABS: 4.3 10*3/uL (ref 1.4–7.7)
NEUTROS PCT: 66.9 % (ref 43.0–77.0)
PLATELETS: 279 10*3/uL (ref 150.0–400.0)
RBC: 4.38 Mil/uL (ref 3.87–5.11)
RDW: 12.5 % (ref 11.5–15.5)
WBC: 6.5 10*3/uL (ref 4.0–10.5)

## 2017-08-20 LAB — LIPID PANEL
CHOLESTEROL: 234 mg/dL — AB (ref 0–200)
HDL: 64 mg/dL (ref >39.00)
LDL CALC: 139 mg/dL — AB (ref 0–99)
NonHDL: 169.83
TRIGLYCERIDES: 152 mg/dL — AB (ref 0.0–149.0)
Total CHOL/HDL Ratio: 4
VLDL: 30.4 mg/dL (ref 0.0–40.0)

## 2017-08-20 LAB — COMPREHENSIVE METABOLIC PANEL
ALT: 23 U/L (ref 0–35)
AST: 21 U/L (ref 0–37)
Albumin: 4.4 g/dL (ref 3.5–5.2)
Alkaline Phosphatase: 88 U/L (ref 39–117)
BILIRUBIN TOTAL: 0.7 mg/dL (ref 0.2–1.2)
BUN: 7 mg/dL (ref 6–23)
CHLORIDE: 101 meq/L (ref 96–112)
CO2: 30 meq/L (ref 19–32)
Calcium: 9.8 mg/dL (ref 8.4–10.5)
Creatinine, Ser: 0.64 mg/dL (ref 0.40–1.20)
GFR: 99.35 mL/min (ref >60.00)
GLUCOSE: 96 mg/dL (ref 70–99)
Potassium: 4.5 mEq/L (ref 3.5–5.1)
Sodium: 140 mEq/L (ref 135–145)
Total Protein: 7.1 g/dL (ref 6.0–8.3)

## 2017-08-20 LAB — HEMOGLOBIN A1C: Hgb A1c MFr Bld: 5.6 % (ref 4.6–6.5)

## 2017-08-20 LAB — TSH: TSH: 2.18 u[IU]/mL (ref 0.35–4.50)

## 2017-08-20 MED ORDER — SERTRALINE HCL 50 MG PO TABS: 50.0000 mg | ORAL_TABLET | Freq: Every day | ORAL | 1 refills | Status: DC

## 2017-08-20 NOTE — Patient Instructions (Addendum)
It was nice to see you today.  Continue flonase nasal spray.  Hydrate.  We will call you with labs and if needed refer to PT.  I have ordered the CT of your chest and they will call to schedule.   Health Maintenance, Female Adopting a healthy lifestyle and getting preventive care can go a long way to promote health and wellness. Talk with your health care provider about what schedule of regular examinations is right for you. This is a good chance for you to check in with your provider about disease prevention and staying healthy. In between checkups, there are plenty of things you can do on your own. Experts have done a lot of research about which lifestyle changes and preventive measures are most likely to keep you healthy. Ask your health care provider for more information. Weight and diet Eat a healthy diet  Be sure to include plenty of vegetables, fruits, low-fat dairy products, and lean protein.  Do not eat a lot of foods high in solid fats, added sugars, or salt.  Get regular exercise. This is one of the most important things you can do for your health. ? Most adults should exercise for at least 150 minutes each week. The exercise should increase your heart rate and make you sweat (moderate-intensity exercise). ? Most adults should also do strengthening exercises at least twice a week. This is in addition to the moderate-intensity exercise.  Maintain a healthy weight  Body mass index (BMI) is a measurement that can be used to identify possible weight problems. It estimates body fat based on height and weight. Your health care provider can help determine your BMI and help you achieve or maintain a healthy weight.  For females 2 years of age and older: ? A BMI below 18.5 is considered underweight. ? A BMI of 18.5 to 24.9 is normal. ? A BMI of 25 to 29.9 is considered overweight. ? A BMI of 30 and above is considered obese.  Watch levels of cholesterol and blood lipids  You should  start having your blood tested for lipids and cholesterol at 64 years of age, then have this test every 5 years.  You may need to have your cholesterol levels checked more often if: ? Your lipid or cholesterol levels are high. ? You are older than 64 years of age. ? You are at high risk for heart disease.  Cancer screening Lung Cancer  Lung cancer screening is recommended for adults 38-59 years old who are at high risk for lung cancer because of a history of smoking.  A yearly low-dose CT scan of the lungs is recommended for people who: ? Currently smoke. ? Have quit within the past 15 years. ? Have at least a 30-pack-year history of smoking. A pack year is smoking an average of one pack of cigarettes a day for 1 year.  Yearly screening should continue until it has been 15 years since you quit.  Yearly screening should stop if you develop a health problem that would prevent you from having lung cancer treatment.  Breast Cancer  Practice breast self-awareness. This means understanding how your breasts normally appear and feel.  It also means doing regular breast self-exams. Let your health care provider know about any changes, no matter how small.  If you are in your 20s or 30s, you should have a clinical breast exam (CBE) by a health care provider every 1-3 years as part of a regular health exam.  If you  are 76 or older, have a CBE every year. Also consider having a breast X-ray (mammogram) every year.  If you have a family history of breast cancer, talk to your health care provider about genetic screening.  If you are at high risk for breast cancer, talk to your health care provider about having an MRI and a mammogram every year.  Breast cancer gene (BRCA) assessment is recommended for women who have family members with BRCA-related cancers. BRCA-related cancers include: ? Breast. ? Ovarian. ? Tubal. ? Peritoneal cancers.  Results of the assessment will determine the need  for genetic counseling and BRCA1 and BRCA2 testing.  Cervical Cancer Your health care provider may recommend that you be screened regularly for cancer of the pelvic organs (ovaries, uterus, and vagina). This screening involves a pelvic examination, including checking for microscopic changes to the surface of your cervix (Pap test). You may be encouraged to have this screening done every 3 years, beginning at age 29.  For women ages 55-65, health care providers may recommend pelvic exams and Pap testing every 3 years, or they may recommend the Pap and pelvic exam, combined with testing for human papilloma virus (HPV), every 5 years. Some types of HPV increase your risk of cervical cancer. Testing for HPV may also be done on women of any age with unclear Pap test results.  Other health care providers may not recommend any screening for nonpregnant women who are considered low risk for pelvic cancer and who do not have symptoms. Ask your health care provider if a screening pelvic exam is right for you.  If you have had past treatment for cervical cancer or a condition that could lead to cancer, you need Pap tests and screening for cancer for at least 20 years after your treatment. If Pap tests have been discontinued, your risk factors (such as having a new sexual partner) need to be reassessed to determine if screening should resume. Some women have medical problems that increase the chance of getting cervical cancer. In these cases, your health care provider may recommend more frequent screening and Pap tests.  Colorectal Cancer  This type of cancer can be detected and often prevented.  Routine colorectal cancer screening usually begins at 64 years of age and continues through 64 years of age.  Your health care provider may recommend screening at an earlier age if you have risk factors for colon cancer.  Your health care provider may also recommend using home test kits to check for hidden blood in  the stool.  A small camera at the end of a tube can be used to examine your colon directly (sigmoidoscopy or colonoscopy). This is done to check for the earliest forms of colorectal cancer.  Routine screening usually begins at age 31.  Direct examination of the colon should be repeated every 5-10 years through 64 years of age. However, you may need to be screened more often if early forms of precancerous polyps or small growths are found.  Skin Cancer  Check your skin from head to toe regularly.  Tell your health care provider about any new moles or changes in moles, especially if there is a change in a mole's shape or color.  Also tell your health care provider if you have a mole that is larger than the size of a pencil eraser.  Always use sunscreen. Apply sunscreen liberally and repeatedly throughout the day.  Protect yourself by wearing long sleeves, pants, a wide-brimmed hat, and sunglasses whenever  you are outside.  Heart disease, diabetes, and high blood pressure  High blood pressure causes heart disease and increases the risk of stroke. High blood pressure is more likely to develop in: ? People who have blood pressure in the high end of the normal range (130-139/85-89 mm Hg). ? People who are overweight or obese. ? People who are African American.  If you are 96-65 years of age, have your blood pressure checked every 3-5 years. If you are 79 years of age or older, have your blood pressure checked every year. You should have your blood pressure measured twice-once when you are at a hospital or clinic, and once when you are not at a hospital or clinic. Record the average of the two measurements. To check your blood pressure when you are not at a hospital or clinic, you can use: ? An automated blood pressure machine at a pharmacy. ? A home blood pressure monitor.  If you are between 53 years and 2 years old, ask your health care provider if you should take aspirin to prevent  strokes.  Have regular diabetes screenings. This involves taking a blood sample to check your fasting blood sugar level. ? If you are at a normal weight and have a low risk for diabetes, have this test once every three years after 64 years of age. ? If you are overweight and have a high risk for diabetes, consider being tested at a younger age or more often. Preventing infection Hepatitis B  If you have a higher risk for hepatitis B, you should be screened for this virus. You are considered at high risk for hepatitis B if: ? You were born in a country where hepatitis B is common. Ask your health care provider which countries are considered high risk. ? Your parents were born in a high-risk country, and you have not been immunized against hepatitis B (hepatitis B vaccine). ? You have HIV or AIDS. ? You use needles to inject street drugs. ? You live with someone who has hepatitis B. ? You have had sex with someone who has hepatitis B. ? You get hemodialysis treatment. ? You take certain medicines for conditions, including cancer, organ transplantation, and autoimmune conditions.  Hepatitis C  Blood testing is recommended for: ? Everyone born from [birth year] through 1967. ? Anyone with known risk factors for hepatitis C.  Sexually transmitted infections (STIs)  You should be screened for sexually transmitted infections (STIs) including gonorrhea and chlamydia if: ? You are sexually active and are younger than 64 years of age. ? You are older than 64 years of age and your health care provider tells you that you are at risk for this type of infection. ? Your sexual activity has changed since you were last screened and you are at an increased risk for chlamydia or gonorrhea. Ask your health care provider if you are at risk.  If you do not have HIV, but are at risk, it may be recommended that you take a prescription medicine daily to prevent HIV infection. This is called pre-exposure prophylaxis  (PrEP). You are considered at risk if: ? You are sexually active and do not regularly use condoms or know the HIV status of your partner(s). ? You take drugs by injection. ? You are sexually active with a partner who has HIV.  Talk with your health care provider about whether you are at high risk of being infected with HIV. If you choose to begin PrEP, you should first  be tested for HIV. You should then be tested every 3 months for as long as you are taking PrEP. Pregnancy  If you are premenopausal and you may become pregnant, ask your health care provider about preconception counseling.  If you may become pregnant, take 400 to 800 micrograms (mcg) of folic acid every day.  If you want to prevent pregnancy, talk to your health care provider about birth control (contraception). Osteoporosis and menopause  Osteoporosis is a disease in which the bones lose minerals and strength with aging. This can result in serious bone fractures. Your risk for osteoporosis can be identified using a bone density scan.  If you are 25 years of age or older, or if you are at risk for osteoporosis and fractures, ask your health care provider if you should be screened.  Ask your health care provider whether you should take a calcium or vitamin D supplement to lower your risk for osteoporosis.  Menopause may have certain physical symptoms and risks.  Hormone replacement therapy may reduce some of these symptoms and risks. Talk to your health care provider about whether hormone replacement therapy is right for you. Follow these instructions at home:  Schedule regular health, dental, and eye exams.  Stay current with your immunizations.  Do not use any tobacco products including cigarettes, chewing tobacco, or electronic cigarettes.  If you are pregnant, do not drink alcohol.  If you are breastfeeding, limit how much and how often you drink alcohol.  Limit alcohol intake to no more than 1 drink per day for  nonpregnant women. One drink equals 12 ounces of beer, 5 ounces of wine, or 1 ounces of hard liquor.  Do not use street drugs.  Do not share needles.  Ask your health care provider for help if you need support or information about quitting drugs.  Tell your health care provider if you often feel depressed.  Tell your health care provider if you have ever been abused or do not feel safe at home. This information is not intended to replace advice given to you by your health care provider. Make sure you discuss any questions you have with your health care provider. Document Released: 08/08/2010 Document Revised: 07/01/2015 Document Reviewed: 10/27/2014 Elsevier Interactive Patient Education  [employer].

## 2017-08-20 NOTE — Progress Notes (Signed)
Patient ID: Carolyn Gutierrez, female  DOB: 1955-10-30, 62 y.o.   MRN: [id number] Patient Care Team    Relationship Specialty Notifications Start End  [name], DO PCP - General Family Medicine  04/05/17    Comment: transfer care to [name], MD Consulting Physician Obstetrics and Gynecology  04/05/17   [name], MD Attending Physician Gastroenterology  04/05/17   [name], [name], [place]  Optometry  04/09/17   [facility]    04/09/17     Chief Complaint  Patient presents with  . Annual Exam    Subjective:  Carolyn Gutierrez is a 62 y.o.  Female  present for CPE . All past medical history, surgical history, allergies, family history, immunizations, medications and social history were updated in the electronic medical record today. All recent labs, ED visits and hospitalizations within the last year were reviewed.  Health maintenance:  Colonoscopy: completed 2016, by Dr. Ardis Hughs, resutls nl. follow up 10. Mammogram: completed:2018, birads ? Pt reports normal, records requeste from Dr. Helane Rima.   Cervical cancer screening: last pap: pt reports last year, records requested from Dr. Helane Rima. Immunizations: tdap UTD 2018, Influenza  (encouraged yearly), PNA series start at 1, shingrix series started today--> repeat 2-6 mos by nurse visit.  Infectious disease screening: HIV and Hep C completed today, pt agreeable to testing.  DEXA: h/o osteopenia--> records requested from GYN.  Assistive device: none Oxygen DDU:KGUR Patient has a Dental home. Hospitalizations/ED visits: none  Depression screen [facility] 2/9 08/20/2017 04/05/2017  Decreased Interest 0 0  Down, Depressed, Hopeless 0 0  PHQ - 2 Score 0 0  Altered sleeping - 1  Tired, decreased energy - 0  Change in appetite - 2  Feeling bad or failure about yourself  - 0  Trouble concentrating - 0  Moving slowly or fidgety/restless - 0  Suicidal thoughts - 0  PHQ-9 Score - 3   GAD 7 : Generalized Anxiety  Score 04/05/2017  Nervous, Anxious, on Edge 0  Control/stop worrying 0  Worry too much - different things 0  Trouble relaxing 0  Restless 0  Easily annoyed or irritable 0  Afraid - awful might happen 0  Total GAD 7 Score 0     Current Exercise Habits: Structured exercise class, Type of exercise: stretching, Time (Minutes): 60, Frequency (Times/Week): 2, Weekly Exercise (Minutes/Week): 120, Intensity: Mild     Immunization History  Administered Date(s) Administered  . Zoster Recombinat (Shingrix) 08/20/2017     Past Medical History:  Diagnosis Date  . Allergy   . Anxiety   . Asthma    in the past- no inhaler currently  . Depression   . Genital warts   . Hay fever   . Migraine   . Stress fracture of right foot    No Known Allergies Past Surgical History:  Procedure Laterality Date  . ABDOMINAL HYSTERECTOMY  01/17/2012   Procedure: HYSTERECTOMY ABDOMINAL;  Surgeon: [name], MD;  Location: [facility];  Service: Gynecology;  Laterality: N/A;  . BREAST SURGERY     breast bx  . COLONOSCOPY     11 yr ago with dr [name] ? a few polyps, no report  . KNEE SURGERY     right  . LAPAROSCOPIC ASSISTED VAGINAL HYSTERECTOMY  01/17/2012   Procedure: LAPAROSCOPIC ASSISTED VAGINAL HYSTERECTOMY;  Surgeon: [name], MD;  Location: [facility];  Service: Gynecology;  Laterality: N/A;  attempted   .  SALPINGOOPHORECTOMY  01/17/2012   Procedure: SALPINGO OOPHORECTOMY;  Surgeon: Michelle L Grewal, MD;  Location: [facility];  Service: Gynecology;  Laterality: Right;   Family History  Problem Relation Age of Onset  . Cancer Mother        pancreatic  . Pancreatic cancer Mother   . Depression Mother   . Cancer Father        basal cell, bladder  . Heart disease Father   . Cancer Sister        melanoma  . Arthritis Sister   . Asthma Sister   . Diabetes Sister   . Hyperlipidemia Sister   . Hypertension Sister   . Kidney disease Sister   . Diabetes Sister   . Hypertension  Sister   . Arthritis Sister   . Colon cancer Neg Hx   . Colon polyps Neg Hx   . Esophageal cancer Neg Hx   . Rectal cancer Neg Hx   . Stomach cancer Neg Hx    Social History   Socioeconomic History  . Marital status: Married    Spouse name: Not on file  . Number of children: Not on file  . Years of education: Not on file  . Highest education level: Not on file  Occupational History  . Not on file  Social Needs  . Financial resource strain: Not on file  . Food insecurity:    Worry: Not on file    Inability: Not on file  . Transportation needs:    Medical: Not on file    Non-medical: Not on file  Tobacco Use  . Smoking status: Never Smoker  . Smokeless tobacco: Never Used  Substance and Sexual Activity  . Alcohol use: Yes    Alcohol/week: 0.0 oz    Comment: Last drink was over a week ago.   . Drug use: No  . Sexual activity: Never  Lifestyle  . Physical activity:    Days per week: Not on file    Minutes per session: Not on file  . Stress: Not on file  Relationships  . Social connections:    Talks on phone: Not on file    Gets together: Not on file    Attends religious service: Not on file    Active member of club or organization: Not on file    Attends meetings of clubs or organizations: Not on file    Relationship status: Not on file  . Intimate partner violence:    Fear of current or ex partner: Not on file    Emotionally abused: Not on file    Physically abused: Not on file    Forced sexual activity: Not on file  Other Topics Concern  . Not on file  Social History Narrative   Married.    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   Vegetarian, consumes dairy products.   Feels safe in her relationships.   Allergies as of 08/20/2017   No Known Allergies     Medication List        Accurate as of 08/20/17 11:35 AM. Always use your most recent med list.          multivitamin  with minerals Tabs tablet Take 1 tablet by mouth daily.   OVER THE COUNTER MEDICATION CBD oil   sertraline 50 MG tablet Commonly known as:  ZOLOFT Take 1 tablet (50 mg total) by mouth daily.         All past medical history, surgical history, allergies, family history, immunizations andmedications were updated in the EMR today and reviewed under the history and medication portions of their EMR.     Recent Results (from the past 2160 hour(s))  POCT Urinalysis Dipstick (Automated)     Status: Abnormal   Collection Time: 08/20/17 11:30 AM  Result Value Ref Range   Color, UA yellow    Clarity, UA clear    Glucose, UA Negative Negative   Bilirubin, UA Negative    Ketones, UA Negative    Spec Grav, UA <=1.005 (A) 1.010 - 1.025   Blood, UA Negative    pH, UA 7.5 5.0 - 8.0   Protein, UA Negative Negative   Urobilinogen, UA 0.2 0.2 or 1.0 E.U./dL   Nitrite, UA Negative    Leukocytes, UA Negative Negative    Ct Renal Stone Study  Result Date: 04/26/2017  IMPRESSION: 1. No renal stones or obstructive uropathy. No acute abnormality in the abdomen/pelvis. 2. Moderate colonic stool burden with mild fecalization of distal small bowel contents suggesting slow transit/constipation. 3. Pulmonary nodules in the lingula and left lower lobe, largest measuring 6 mm. Non-contrast chest CT at 3-6 months is recommended. If the nodules are stable at time of repeat CT, then future CT at 18-24 months (from today's scan) is considered optional for low-risk patients, but is recommended for high-risk patients. This recommendation follows the consensus statement: Guidelines for Management of Incidental Pulmonary Nodules Detected on CT Images: From the Fleischner Society 2017; Radiology 2017; 284:228-243. 4.  Aortic Atherosclerosis (ICD10-I70.0). Electronically Signed   By: Jeb Levering M.D.   On: 04/26/2017 06:15     ROS: 14 pt review of systems performed and negative (unless mentioned in an  HPI)  Objective: BP 120/79 (BP Location: Left Arm, Patient Position: Sitting, Cuff Size: Normal)   Pulse 70   Temp 98.2 F (36.8 C) (Oral)   Resp 16   Ht 5' 7.25" (1.708 m)   Wt 184 lb (83.5 kg)   SpO2 97%   BMI 28.60 kg/m  Gen: Afebrile. No acute distress. Nontoxic in appearance, well-developed, well-nourished,  Pleasant overweight caucasian female.  HENT: AT. [place]. Bilateral TM visualized and normal in appearance, normal external auditory canal. MMM, no oral lesions, adequate dentition. Bilateral nares within normal limits. Throat without erythema, ulcerations or exudates. no Cough on exam, no hoarseness on exam. Eyes:Pupils Equal Round Reactive to light, Extraocular movements intact,  Conjunctiva without redness, discharge or icterus. Neck/lymp/endocrine: Supple,no lymphadenopathy, no thyromegaly CV: RRR no murmur, no edema, +2/4 P posterior tibialis pulses. no carotid bruits. No JVD. Chest: CTAB, no wheeze, rhonchi or crackles. normal Respiratory effort. good Air movement. Abd: Soft. flat. NTND. BS present. no Masses palpated. No hepatosplenomegaly. No rebound tenderness or guarding. Skin: no rashes, purpura or petechiae. Warm and well-perfused. Skin intact. Neuro/Msk:  Normal gait. PERLA. EOMi. Alert. Oriented x3.  Cranial nerves II through XII intact. Muscle strength 5/5 upper/lower extremity. DTRs equal bilaterally. Mild dizziness/vertigo with sitting up Psych: Normal affect, dress and demeanor. Normal speech. Normal thought content and judgment.   No exam data present  Assessment/plan: Carolyn Gutierrez is a 62 y.o. female present for CPE. Pulmonary nodule/Abnormal CT scan of lung Low risk--> abnormal with multiple nodules (largest 74m) 3/21/219 with recs to follow up image in 3-6 months.  - non-smoker - CT Chest W Contrast; Future Vertigo Present for last few weeks. Felt it started after sinus infection, treated with abx and prednisone. Using  flonase.  - orthostatic  negative, vertigo reproduced with sitting up x 2.  - CBC w/Diff - TSH - POCT Urinalysis Dipstick (Automated) Overweight (BMI 25.0-29.9) - diet and exercise modifications.  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  CMP--> vertigo? Hyponatremia form medication.  Encounter for preventive exam with abnl findings.  Patient was encouraged to exercise greater than 150 minutes a week. Patient was encouraged to choose a diet filled with fresh fruits and vegetables, and lean meats. AVS provided to patient today for education/recommendation on gender specific health and safety maintenance. Colonoscopy: completed 2016, by Dr. Ardis Hughs, resutls nl. follow up 10. Mammogram: completed:2018, birads ? Pt reports normal, records requeste from Dr. Helane Rima.   Cervical cancer screening: last pap: pt reports last year, records requested from Dr. Helane Rima. Immunizations: tdap UTD 2018, Influenza  (encouraged yearly), PNA series start at 70, shingrix series started today--> repeat 2-6 mos by nurse visit.  Infectious disease screening: HIV and Hep C completed today, pt agreeable to testing.  DEXA: h/o osteopenia--> records requested from GYN.    Return in about 1 year (around 08/21/2018) for CPE. 6 months for chronic medical conditions.  Electronically signed by: Howard Pouch, DO [place]

## 2017-08-21 LAB — HIV ANTIBODY (ROUTINE TESTING W REFLEX): HIV: NONREACTIVE

## 2017-08-21 LAB — HEPATITIS C ANTIBODY
HEP C AB: NONREACTIVE
SIGNAL TO CUT-OFF: 0.02 (ref <1.00)

## 2017-08-24 ENCOUNTER — Ambulatory Visit
Admission: RE | Admit: 2017-08-24 | Discharge: 2017-08-24 | Disposition: A | Discharge status: Home / Self Care | Payer: 59 | Source: Ambulatory Visit | Attending: Family Medicine | Admitting: Family Medicine

## 2017-08-24 ENCOUNTER — Telehealth: Payer: Self-pay | Admitting: Family Medicine

## 2017-08-24 DIAGNOSIS — R911 Solitary pulmonary nodule: Secondary | ICD-10-CM

## 2017-08-24 DIAGNOSIS — R918 Other nonspecific abnormal finding of lung field: Secondary | ICD-10-CM | POA: Diagnosis not present

## 2017-08-24 NOTE — Telephone Encounter (Signed)
Please inform patient the following information: Her follow chest CT on her pulmonary nodules is stable. There are multiple nodules present in both sides of her lungs, but none of them appear worrisome. No followup on nodules needed.    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 The coronary artery finding means she  needs to exercise and keep her cholesterol in check, eat healthy to prevent progression. People with large amounts of coronary disease are at higher risk of a  heart attack or heart disease.

## 2017-08-24 NOTE — Telephone Encounter (Signed)
Left detailed message with results and instructions on patient voice mail per DPR 

## 2017-08-27 ENCOUNTER — Encounter: Payer: Self-pay | Admitting: Family Medicine

## 2017-09-05 DIAGNOSIS — L820 Inflamed seborrheic keratosis: Secondary | ICD-10-CM | POA: Diagnosis not present

## 2017-10-15 ENCOUNTER — Encounter: Payer: Self-pay | Admitting: Family Medicine

## 2017-10-15 ENCOUNTER — Ambulatory Visit: Payer: 59 | Admitting: Family Medicine

## 2017-10-15 VITALS — BP 128/78 | HR 82 | Temp 97.8°F | Resp 20 | Ht 67.5 in | Wt 185.2 lb

## 2017-10-15 DIAGNOSIS — J0110 Acute frontal sinusitis, unspecified: Secondary | ICD-10-CM

## 2017-10-15 MED ORDER — DOXYCYCLINE HYCLATE 100 MG PO TABS: 100.0000 mg | ORAL_TABLET | Freq: Two times a day (BID) | ORAL | 0 refills | Status: DC

## 2017-10-15 NOTE — Patient Instructions (Signed)
Rest, hydrate.  + flonase, nettie pot or nasal saline.  Zyrtec at night.  Doxycyline every 12 hr for 10 d, prescribed, take until completed.  If cough present it can last up to 6-8 weeks.  F/U 2 weeks of not improved.    Sinusitis, Adult Sinusitis is soreness and inflammation of your sinuses. Sinuses are hollow spaces in the bones around your face. They are located:  Around your eyes.  In the middle of your forehead.  Behind your nose.  In your cheekbones.  Your sinuses and nasal passages are lined with a stringy fluid (mucus). Mucus normally drains out of your sinuses. When your nasal tissues get inflamed or swollen, the mucus can get trapped or blocked so air cannot flow through your sinuses. This lets bacteria, viruses, and funguses grow, and that leads to infection. Follow these instructions at home: Medicines  Take, use, or apply over-the-counter and prescription medicines only as told by your doctor. These may include nasal sprays.  If you were prescribed an antibiotic medicine, take it as told by your doctor. Do not stop taking the antibiotic even if you start to feel better. Hydrate and Humidify  Drink enough water to keep your pee (urine) clear or pale yellow.  Use a cool mist humidifier to keep the humidity level in your home above 50%.  Breathe in steam for 10-15 minutes, 3-4 times a day or as told by your doctor. You can do this in the bathroom while a hot shower is running.  Try not to spend time in cool or dry air. Rest  Rest as much as possible.  Sleep with your head raised (elevated).  Make sure to get enough sleep each night. General instructions  Put a warm, moist washcloth on your face 3-4 times a day or as told by your doctor. This will help with discomfort.  Wash your hands often with soap and water. If there is no soap and water, use hand sanitizer.  Do not smoke. Avoid being around people who are smoking (secondhand smoke).  Keep all follow-up  visits as told by your doctor. This is important. Contact a doctor if:  You have a fever.  Your symptoms get worse.  Your symptoms do not get better within 10 days. Get help right away if:  You have a very bad headache.  You cannot stop throwing up (vomiting).  You have pain or swelling around your face or eyes.  You have trouble seeing.  You feel confused.  Your neck is stiff.  You have trouble breathing. This information is not intended to replace advice given to you by your health care provider. Make sure you discuss any questions you have with your health care provider. Document Released: 07/12/2007 Document Revised: 09/19/2015 Document Reviewed: 11/18/2014 Elsevier Interactive Patient Education  [employer].

## 2017-10-15 NOTE — Progress Notes (Signed)
Carolyn Gutierrez , March 07, 1955, 62 y.o., female MRN: [id number] Patient Care Team    Relationship Specialty Notifications Start End  [name], DO PCP - General Family Medicine  04/05/17    Comment: transfer care to [name], MD Consulting Physician Obstetrics and Gynecology  04/05/17   [name], MD Attending Physician Gastroenterology  04/05/17   [name], [name], [place]  Optometry  04/09/17   [facility]    04/09/17     Chief Complaint  Patient presents with  . URI    headache and teeth hurting x 2 days     Subjective: Pt presents for an OV with complaints of headache and sinus pressure of 2-3 days duration.  Associated symptoms include teeth pain right, righ tear fullness, sore throat, nasal drainage. She thought she had a tooth infection in end of last month and was seen at her dentist. She states xrays and exam was normal, with the exception of the appearance of sinus infection per her dentist.She was treated with amox BID.   Pt has tried allegra-d to ease their symptoms. She has continued her flonase use.   Depression screen [facility] 2/9 08/20/2017 04/05/2017  Decreased Interest 0 0  Down, Depressed, Hopeless 0 0  PHQ - 2 Score 0 0  Altered sleeping - 1  Tired, decreased energy - 0  Change in appetite - 2  Feeling bad or failure about yourself  - 0  Trouble concentrating - 0  Moving slowly or fidgety/restless - 0  Suicidal thoughts - 0  PHQ-9 Score - 3    No Known Allergies Social History   Tobacco Use  . Smoking status: Never Smoker  . Smokeless tobacco: Never Used  Substance Use Topics  . Alcohol use: Yes    Alcohol/week: 0.0 standard drinks    Comment: Last drink was over a week ago.    Past Medical History:  Diagnosis Date  . Allergy   . Anxiety   . Asthma    in the past- no inhaler currently  . Depression   . Genital warts   . Hay fever   . Migraine   . Stress fracture of right foot    Past Surgical History:  Procedure  Laterality Date  . ABDOMINAL HYSTERECTOMY  01/17/2012   Procedure: HYSTERECTOMY ABDOMINAL;  Surgeon: [name], MD;  Location: [facility];  Service: Gynecology;  Laterality: N/A;  . BREAST SURGERY     breast bx  . COLONOSCOPY     11 yr ago with dr [name] ? a few polyps, no report  . KNEE SURGERY     right  . LAPAROSCOPIC ASSISTED VAGINAL HYSTERECTOMY  01/17/2012   Procedure: LAPAROSCOPIC ASSISTED VAGINAL HYSTERECTOMY;  Surgeon: [name], MD;  Location: [facility];  Service: Gynecology;  Laterality: N/A;  attempted   . SALPINGOOPHORECTOMY  01/17/2012   Procedure: SALPINGO OOPHORECTOMY;  Surgeon: [name], MD;  Location: [facility];  Service: Gynecology;  Laterality: Right;   Family History  Problem Relation Age of Onset  . Cancer Mother        pancreatic  . Pancreatic cancer Mother   . Depression Mother   . Cancer Father        basal cell, bladder  . Heart disease Father   . Cancer Sister        melanoma  . Arthritis Sister   . Asthma Sister   . Diabetes Sister   . Hyperlipidemia Sister   .  Hypertension Sister   . Kidney disease Sister   . Diabetes Sister   . Hypertension Sister   . Arthritis Sister   . Colon cancer Neg Hx   . Colon polyps Neg Hx   . Esophageal cancer Neg Hx   . Rectal cancer Neg Hx   . Stomach cancer Neg Hx    Allergies as of 10/15/2017   No Known Allergies     Medication List        Accurate as of 10/15/17  3:47 PM. Always use your most recent med list.          fexofenadine-pseudoephedrine 60-120 MG 12 hr tablet Commonly known as:  ALLEGRA-D Take 1 tablet by mouth 2 (two) times daily.   multivitamin with minerals Tabs tablet Take 1 tablet by mouth daily.   sertraline 50 MG tablet Commonly known as:  ZOLOFT Take 1 tablet (50 mg total) by mouth daily.       All past medical history, surgical history, allergies, family history, immunizations andmedications were updated in the EMR today and reviewed under the history and  medication portions of their EMR.     ROS: Negative, with the exception of above mentioned in HPI   Objective:  BP 128/78 (BP Location: Right Arm, Patient Position: Sitting, Cuff Size: Normal)   Pulse 82   Temp 97.8 F (36.6 C)   Resp 20   Ht 5' 7.5" (1.715 m)   Wt 185 lb 4 oz (84 kg)   SpO2 98%   BMI 28.59 kg/m  Body mass index is 28.59 kg/m. Gen: Afebrile. No acute distress. Nontoxic in appearance, well developed, well nourished.  HENT: AT. [place]. Bilateral TM visualized without erythema or fullness. MMM, no oral lesions. Bilateral nares with red enlarged nasal turbinates, drainage present. Throat without erythema or exudates. No cough or hoarseness. TTP frontal sinus.  Eyes:Pupils Equal Round Reactive to light, Extraocular movements intact,  Conjunctiva without redness, discharge or icterus. Neck/lymp/endocrine: Supple,no lymphadenopathy CV: RRR no murmur, no edema Chest: CTAB, no wheeze or crackles. Good air movement, normal resp effort.  Abd: Soft. NTND. BS present. no Masses palpated. No rebound or guarding.  Neuro:  Normal gait. PERLA. EOMi. Alert. Oriented x3   No exam data present No results found. No results found for this or any previous visit (from the past 24 hour(s)).  Assessment/Plan: [name] is a 62 y.o. female present for OV for  Acute non-recurrent frontal and maxillary sinusitis Recurrent sinus infection. Possibly secondary to incomplete tx with BID dose of amox. Rest, hydrate.  + flonase, nettie pot or nasal saline.  Zyrtec at night.  Doxycyline every 12 hr for 10 d, prescribed, take until completed.  If cough present it can last up to 6-8 weeks.  F/U 2 weeks of not improved.    Reviewed expectations re: course of current medical issues.  Discussed self-management of symptoms.  Outlined signs and symptoms indicating need for more acute intervention.  Patient verbalized understanding and all questions were answered.  Patient received an  After-Visit Summary.    No orders of the defined types were placed in this encounter.    Note is dictated utilizing voice recognition software. Although note has been proof read prior to signing, occasional typographical errors still can be missed. If any questions arise, please do not hesitate to call for verification.   electronically signed by:  Howard Pouch, DO  [place]

## 2017-11-20 ENCOUNTER — Ambulatory Visit (INDEPENDENT_AMBULATORY_CARE_PROVIDER_SITE_OTHER): Payer: 59 | Admitting: *Deleted

## 2017-11-20 DIAGNOSIS — Z23 Encounter for immunization: Secondary | ICD-10-CM

## 2017-11-20 NOTE — Progress Notes (Addendum)
Carolyn Gutierrez is a 62 y.o. female presents to the office today for Shingrix #2 injection, per physician's orders. Original order: 08/20/17 Shingrix, 0.23mL, IM was administered right deltoid today.  Pt tolerated injection well, given without incident or problem. Patient left without complaint.   Pt was offered flu vaccine but declined. HM updated.    [place] screening examination/treatment/procedure(s) were performed by non-physician practitioner and as supervising physician I was immediately available for consultation/collaboration.  I agree with above assessment and plan.  Electronically Signed by: Howard Pouch, DO [place] [facility] [place]

## 2018-02-12 DIAGNOSIS — Z01419 Encounter for gynecological examination (general) (routine) without abnormal findings: Secondary | ICD-10-CM | POA: Diagnosis not present

## 2018-02-12 DIAGNOSIS — Z6825 Body mass index (BMI) 25.0-25.9, adult: Secondary | ICD-10-CM | POA: Diagnosis not present

## 2018-02-20 ENCOUNTER — Ambulatory Visit: Payer: Self-pay | Admitting: Family Medicine

## 2018-02-26 DIAGNOSIS — J019 Acute sinusitis, unspecified: Secondary | ICD-10-CM | POA: Diagnosis not present

## 2018-03-07 DIAGNOSIS — L821 Other seborrheic keratosis: Secondary | ICD-10-CM | POA: Diagnosis not present

## 2018-03-07 DIAGNOSIS — L570 Actinic keratosis: Secondary | ICD-10-CM | POA: Diagnosis not present

## 2018-08-22 ENCOUNTER — Encounter: Payer: Self-pay | Admitting: Family Medicine

## 2019-03-07 ENCOUNTER — Ambulatory Visit: Payer: 59

## 2019-03-15 ENCOUNTER — Ambulatory Visit: Payer: Medicare Other | Attending: Internal Medicine

## 2019-03-15 DIAGNOSIS — Z23 Encounter for immunization: Secondary | ICD-10-CM | POA: Insufficient documentation

## 2019-03-15 NOTE — Progress Notes (Signed)
   Covid-19 Vaccination Clinic  Name:  Carolyn Gutierrez    MRN: [id number] DOB: 04-May-1955  03/15/2019  [name] was observed post Covid-19 immunization for 15 minutes without incidence. She was provided with Vaccine Information Sheet and instruction to access the V-Safe system.   [name] was instructed to call 911 with any severe reactions post vaccine: Marland Kitchen Difficulty breathing  . Swelling of your face and throat  . A fast heartbeat  . A bad rash all over your body  . Dizziness and weakness    Immunizations Administered    Name Date Dose VIS Date Route   Pfizer COVID-19 Vaccine 03/15/2019  3:59 PM 0.3 mL 01/17/2019 Intramuscular   Manufacturer: [place]   Lot: [id number]   [place]: [id number]

## 2019-03-18 ENCOUNTER — Ambulatory Visit: Payer: 59

## 2019-04-09 ENCOUNTER — Ambulatory Visit: Payer: Medicare Other | Attending: Internal Medicine

## 2019-04-09 DIAGNOSIS — Z23 Encounter for immunization: Secondary | ICD-10-CM | POA: Insufficient documentation

## 2019-04-09 NOTE — Progress Notes (Signed)
   Covid-19 Vaccination Clinic  Name:  Carolyn Gutierrez    MRN: [id number] DOB: 01-30-1956  04/09/2019  [name] was observed post Covid-19 immunization for 15 minutes without incident. She was provided with Vaccine Information Sheet and instruction to access the V-Safe system.   [name] was instructed to call 911 with any severe reactions post vaccine: Marland Kitchen Difficulty breathing  . Swelling of face and throat  . A fast heartbeat  . A bad rash all over body  . Dizziness and weakness   Immunizations Administered    Name Date Dose VIS Date Route   Pfizer COVID-19 Vaccine 04/09/2019 12:20 PM 0.3 mL 01/17/2019 Intramuscular   Manufacturer: [place]   Lot: [id number]   [place]: [id number]

## 2021-01-04 ENCOUNTER — Other Ambulatory Visit: Payer: Self-pay | Admitting: Family Medicine

## 2021-01-04 DIAGNOSIS — R918 Other nonspecific abnormal finding of lung field: Secondary | ICD-10-CM

## 2021-01-13 ENCOUNTER — Ambulatory Visit
Admission: RE | Admit: 2021-01-13 | Discharge: 2021-01-13 | Disposition: A | Discharge status: Home / Self Care | Payer: Medicare Other | Source: Ambulatory Visit | Attending: Family Medicine | Admitting: Family Medicine

## 2021-01-13 ENCOUNTER — Other Ambulatory Visit: Payer: Self-pay

## 2021-01-13 DIAGNOSIS — R918 Other nonspecific abnormal finding of lung field: Secondary | ICD-10-CM

## 2021-01-26 ENCOUNTER — Other Ambulatory Visit: Payer: 59

## 2021-03-29 NOTE — Progress Notes (Signed)
Cardiology Office Note:    Date:  03/30/2021   ID:  Carolyn Gutierrez, DOB 10-Apr-1955, MRN [id number]  PCP:  Hayden Rasmussen, MD  Cardiologist:  None    Referring MD: Hayden Rasmussen, MD   No chief complaint on file.   History of Present Illness:    Carolyn Gutierrez is a 66 y.o. female with a hx of anxiety here today for evaluation of moderate coronary artery disease at the request of Dr. Darron Doom. Chest CT 01/2021 for follow-up of pulmonary nodules also revealed moderate aortic atherosclerosis.  She saw a cardiologist at [facility] who recommended rosuvastatin and lifestyle changes. Given that she had no symptoms, no additional testing was done.  She followed-up with her PCP and she started rosuvastatin 5 mg and requested a second opinion.   Today, she is doing well. She started exercising for 30 minutes a day and has lost 10 lbs. She walks and uses the treadmill or recumbent bicycle. She works with a Engineer, maintenance (IT) and they discussed supplements like red yeast rice. She wonders if she could control her cholesterol  through exercise and diet without statins. She has been a vegetarian for 30 years. Reportedly, her blood sugar is in the pre-diabetes range. Her sister had diabetes. Both of her grandmothers had heart disease. Her paternal grandmother passed away of a stroke and her paternal uncle had an MI. 2 of her maternal uncles also had MIs. She has never smoked. She denies any palpitations, chest pain, or shortness of breath, lightheadedness, headaches, syncope, orthopnea, PND, lower extremity edema or exertional symptoms.  Past Medical History:  Diagnosis Date   Allergy    Anxiety    Aortic atherosclerosis ([place]) 03/30/2021   Asthma    in the past- no inhaler currently   Depression    Elevated blood pressure reading 03/30/2021   Genital warts    Hay fever    Migraine    Pure hypercholesterolemia 03/30/2021   Stress fracture of right foot     Past Surgical History:  Procedure  Laterality Date   ABDOMINAL HYSTERECTOMY  01/17/2012   Procedure: HYSTERECTOMY ABDOMINAL;  Surgeon: [name], MD;  Location: [facility];  Service: Gynecology;  Laterality: N/A;   BREAST SURGERY     breast bx   COLONOSCOPY     11 yr ago with dr [name] ? a few polyps, no report   KNEE SURGERY     right   LAPAROSCOPIC ASSISTED VAGINAL HYSTERECTOMY  01/17/2012   Procedure: LAPAROSCOPIC ASSISTED VAGINAL HYSTERECTOMY;  Surgeon: [name], MD;  Location: [facility];  Service: Gynecology;  Laterality: N/A;  attempted    SALPINGOOPHORECTOMY  01/17/2012   Procedure: SALPINGO OOPHORECTOMY;  Surgeon: [name], MD;  Location: [facility];  Service: Gynecology;  Laterality: Right;    Current Medications: Current Meds  Medication Sig   fexofenadine-pseudoephedrine (ALLEGRA-D) 60-120 MG 12 hr tablet Take 1 tablet by mouth as needed.   Multiple Vitamin (MULTIVITAMIN WITH MINERALS) TABS Take 1 tablet by mouth daily.   Omega 3-5-6-7-9 Fatty Acids (COMPLETE OMEGA) CAPS Take 1 capsule by mouth daily.   rosuvastatin (CRESTOR) 10 MG tablet Take 1 tablet (10 mg total) by mouth daily.     Allergies:   Patient has no known allergies.   Social History   Socioeconomic History   Marital status: Married    Spouse name: Not on file   Number of children: Not on file   Years of education: Not on  file   Highest education level: Not on file  Occupational History   Not on file  Tobacco Use   Smoking status: Never   Smokeless tobacco: Never  Vaping Use   Vaping Use: Never used  Substance and Sexual Activity   Alcohol use: Yes    Alcohol/week: 0.0 standard drinks    Comment: Last drink was over a week ago.    Drug use: No   Sexual activity: Never  Other Topics Concern   Not on file  Social History Narrative   Married.    Bachelor's of arts degree Metallurgist    Wears a bicycle helmet, smoke alarm and home, wears her seatbelt.   Vegetarian, consumes dairy products.   Feels  safe in her relationships.   Social Determinants of Health   Financial Resource Strain: Low Risk    Difficulty of Paying Living Expenses: Not hard at all  Food Insecurity: No Food Insecurity   Worried About Charity fundraiser in the Last Year: Never true   [place] in the Last Year: Never true  Transportation Needs: No Transportation Needs   Lack of Transportation (Medical): No   Lack of Transportation (Non-Medical): No  Physical Activity: Sufficiently Active   Days of Exercise per Week: 7 days   Minutes of Exercise per Session: 30 min  Stress: Not on file  Social Connections: Not on file     Family History: The patient's family history includes Arthritis in her sister and sister; Asthma in her sister; Cancer in her father, mother, and sister; Depression in her mother; Diabetes in her sister and sister; Heart attack in her maternal uncle and paternal uncle; Heart disease in her father; Hyperlipidemia in her sister; Hypertension in her sister and sister; Kidney disease in her sister; Pancreatic cancer in her mother; Stroke in her paternal grandmother. There is no history of Colon cancer, Colon polyps, Esophageal cancer, Rectal cancer, or Stomach cancer.  ROS:   Please see the history of present illness.    All other systems reviewed and negative.   EKGs/Labs/Other Studies Reviewed:    The following studies were reviewed today: Chest CT 01/13/21 IMPRESSION: 1. Interval decreased prominence of less than 5 mm sub solid pulmonary nodules in the right upper and left lower lobes since 2019 comparison, almost certainly benign. These nodules do not warrant additional surveillance. 2. Coronary and aortic atherosclerosis (ICD10-I70.0).  EKG:  EKG was not ordered today  Recent Labs: No results found for requested labs within last 8760 hours.  02/02/21 Total Cholesterol: 267 Triglycerides: 148 HDL: 59 LDL: 178  Recent Lipid Panel    Component Value Date/Time   CHOL 234 (H)  08/20/2017 1125   TRIG 152.0 (H) 08/20/2017 1125   HDL 64.00 08/20/2017 1125   CHOLHDL 4 08/20/2017 1125   VLDL 30.4 08/20/2017 1125   LDLCALC 139 (H) 08/20/2017 1125   LDLDIRECT 124.9 07/09/2008 0853    Physical Exam:    VS:  BP 126/80 (BP Location: Right Arm, Patient Position: Sitting, Cuff Size: Normal)    Pulse 60    Ht 5' 7.5" (1.715 m)    Wt 159 lb 12.8 oz (72.5 kg)    SpO2 97%    BMI 24.66 kg/m  , BMI Body mass index is 24.66 kg/m. GENERAL:  Well appearing HEENT: Pupils equal round and reactive, fundi not visualized, oral mucosa unremarkable NECK:  No jugular venous distention, waveform within normal limits, carotid upstroke brisk and symmetric, no bruits,  no thyromegaly LUNGS:  Clear to auscultation bilaterally HEART:  RRR.  PMI not displaced or sustained,S1 and S2 within normal limits, no S3, no S4, no clicks, no rubs, no murmurs ABD:  Flat, positive bowel sounds normal in frequency in pitch, no bruits, no rebound, no guarding, no midline pulsatile mass, no hepatomegaly, no splenomegaly EXT:  2 plus pulses throughout, no edema, no cyanosis no clubbing SKIN:  No rashes no nodules NEURO:  Cranial nerves II through XII grossly intact, motor grossly intact throughout PSYCH:  Cognitively intact, oriented to person place and time   ASSESSMENT:    1. Therapeutic drug monitoring   2. CAD in native artery   3. Elevated blood pressure reading   4. Pure hypercholesterolemia   5. Other abnormal glucose    PLAN:    CAD in native artery Noted on chest CT 01/2021.  Personally reviewed today.  She has mostly LAD calcification.  She exercises regularly and has no exertional symptoms.  Therefore no plans for any ischemic evaluation at this time.  Her LDL goal should be less than 70.  She already has a very healthy diet and is vegetarian.  She has also intermittently been vegan.  We discussed the fact that given that she has a healthy lifestyle it is almost impossible for her to get her LDL  from 1 78-70 without medical intervention.  She expressed understanding and is willing to try rosuvastatin.  She does have concerns that it could increase the likelihood of developing diabetes.  I agree that this is a possibility, nevertheless statins are most effective therapies at reducing future morbidity and mortality.  We will start rosuvastatin 10 mg daily.  Check lipids, CMP, and A1c in 2 to 3 months.  She will continue working on diet and exercise.  She will try to move back towards plant-based wherever possible.  Elevated blood pressure reading Blood pressure was initially elevated but better on repeat.  She will get a blood pressure cuff and track at home.  Continue with diet and exercise as above.  Pure hypercholesterolemia Given her coronary calcification her LDL goal is less than 70.  We are going to start rosuvastatin as above.  Continue with diet and exercise.  In order of problems listed above:   Disposition: FU with Mariluz Crespo C. Oval Linsey, MD, [facility] in 3 months  Medication Adjustments/Labs and Tests Ordered: Current medicines are reviewed at length with the patient today.  Concerns regarding medicines are outlined above.  Orders Placed This Encounter  Procedures   Lipid panel   Comprehensive metabolic panel   HgB J6E   Meds ordered this encounter  Medications   rosuvastatin (CRESTOR) 10 MG tablet    Sig: Take 1 tablet (10 mg total) by mouth daily.    Dispense:  90 tablet    Refill:  3   I,Mykaella Javier,acting as a scribe for Skeet Latch, MD.,have documented all relevant documentation on the behalf of Skeet Latch, MD,as directed by  Skeet Latch, MD while in the presence of Skeet Latch, MD.  I, Cartwright Oval Linsey, MD have reviewed all documentation for this visit.  The documentation of the exam, diagnosis, procedures, and orders on 03/30/2021 are all accurate and complete.   Signed, Skeet Latch, MD  03/30/2021 9:39 AM    [facility]

## 2021-03-30 ENCOUNTER — Other Ambulatory Visit: Payer: Self-pay

## 2021-03-30 ENCOUNTER — Ambulatory Visit (INDEPENDENT_AMBULATORY_CARE_PROVIDER_SITE_OTHER): Payer: Medicare Other | Admitting: Cardiovascular Disease

## 2021-03-30 ENCOUNTER — Encounter (HOSPITAL_BASED_OUTPATIENT_CLINIC_OR_DEPARTMENT_OTHER): Payer: Self-pay | Admitting: Cardiovascular Disease

## 2021-03-30 VITALS — BP 126/80 | HR 60 | Ht 67.5 in | Wt 159.8 lb

## 2021-03-30 DIAGNOSIS — R030 Elevated blood-pressure reading, without diagnosis of hypertension: Secondary | ICD-10-CM | POA: Diagnosis not present

## 2021-03-30 DIAGNOSIS — I700 Atherosclerosis of aorta: Secondary | ICD-10-CM

## 2021-03-30 DIAGNOSIS — Z5181 Encounter for therapeutic drug level monitoring: Secondary | ICD-10-CM

## 2021-03-30 DIAGNOSIS — I2510 Atherosclerotic heart disease of native coronary artery without angina pectoris: Secondary | ICD-10-CM

## 2021-03-30 DIAGNOSIS — E7800 Pure hypercholesterolemia, unspecified: Secondary | ICD-10-CM

## 2021-03-30 DIAGNOSIS — R7309 Other abnormal glucose: Secondary | ICD-10-CM

## 2021-03-30 HISTORY — DX: Elevated blood-pressure reading, without diagnosis of hypertension: R03.0

## 2021-03-30 HISTORY — DX: Atherosclerosis of aorta: I70.0

## 2021-03-30 HISTORY — DX: Pure hypercholesterolemia, unspecified: E78.00

## 2021-03-30 MED ORDER — ROSUVASTATIN CALCIUM 10 MG PO TABS: 10.0000 mg | ORAL_TABLET | Freq: Every day | ORAL | 3 refills | Status: DC

## 2021-03-30 NOTE — Assessment & Plan Note (Signed)
Noted on chest CT 01/2021.  Personally reviewed today.  She has mostly LAD calcification.  She exercises regularly and has no exertional symptoms.  Therefore no plans for any ischemic evaluation at this time.  Her LDL goal should be less than 70.  She already has a very healthy diet and is vegetarian.  She has also intermittently been vegan.  We discussed the fact that given that she has a healthy lifestyle it is almost impossible for her to get her LDL from 1 78-70 without medical intervention.  She expressed understanding and is willing to try rosuvastatin.  She does have concerns that it could increase the likelihood of developing diabetes.  I agree that this is a possibility, nevertheless statins are most effective therapies at reducing future morbidity and mortality.  We will start rosuvastatin 10 mg daily.  Check lipids, CMP, and A1c in 2 to 3 months.  She will continue working on diet and exercise.  She will try to move back towards plant-based wherever possible.

## 2021-03-30 NOTE — Assessment & Plan Note (Signed)
Blood pressure was initially elevated but better on repeat.  She will get a blood pressure cuff and track at home.  Continue with diet and exercise as above.

## 2021-03-30 NOTE — Assessment & Plan Note (Signed)
Given her coronary calcification her LDL goal is less than 70.  We are going to start rosuvastatin as above.  Continue with diet and exercise.

## 2021-03-30 NOTE — Patient Instructions (Signed)
Medication Instructions:  START ROSUVASTATIN 10 MG DAILY   *If you need a refill on your cardiac medications before your next appointment, please call your pharmacy*  Lab Work: FASTING LP/CMET IN 2-3 MONTHS PRIOR TO YOUR FOLLOW UP VISIT   If you have labs (blood work) drawn today and your tests are completely normal, you will receive your results only by: MyChart Message (if you have MyChart) OR A paper copy in the mail If you have any lab test that is abnormal or we need to change your treatment, we will call you to review the results.  Testing/Procedures: NONE   Follow-Up: At [facility], you and your health needs are our priority.  As part of our continuing mission to provide you with exceptional heart care, we have created designated Provider Care Teams.  These Care Teams include your primary Cardiologist (physician) and Advanced Practice Providers (APPs -  Physician Assistants and Nurse Practitioners) who all work together to provide you with the care you need, when you need it.  We recommend signing up for the patient portal called "MyChart".  Sign up information is provided on this After Visit Summary.  MyChart is used to connect with patients for Virtual Visits (Telemedicine).  Patients are able to view lab/test results, encounter notes, upcoming appointments, etc.  Non-urgent messages can be sent to your provider as well.   To learn more about what you can do with MyChart, go to [email or web address].    Your next appointment:   AFTER YOUR LABS IN 2-3 month(s)  The format for your next appointment:   In Person  Provider:   Skeet Latch, MD{

## 2021-03-31 ENCOUNTER — Encounter (HOSPITAL_BASED_OUTPATIENT_CLINIC_OR_DEPARTMENT_OTHER): Payer: Self-pay

## 2021-06-15 LAB — COMPREHENSIVE METABOLIC PANEL
ALT: 9 IU/L (ref 0–32)
AST: 15 IU/L (ref 0–40)
Albumin/Globulin Ratio: 1.8 (ref 1.2–2.2)
Albumin: 4.4 g/dL (ref 3.8–4.8)
Alkaline Phosphatase: 90 IU/L (ref 44–121)
BUN/Creatinine Ratio: 11 — ABNORMAL LOW (ref 12–28)
BUN: 7 mg/dL — ABNORMAL LOW (ref 8–27)
Bilirubin Total: 0.5 mg/dL (ref 0.0–1.2)
CO2: 26 mmol/L (ref 20–29)
Calcium: 10 mg/dL (ref 8.7–10.3)
Chloride: 104 mmol/L (ref 96–106)
Creatinine, Ser: 0.64 mg/dL (ref 0.57–1.00)
Globulin, Total: 2.5 g/dL (ref 1.5–4.5)
Glucose: 90 mg/dL (ref 70–99)
Potassium: 4.9 mmol/L (ref 3.5–5.2)
Sodium: 142 mmol/L (ref 134–144)
Total Protein: 6.9 g/dL (ref 6.0–8.5)
eGFR: 97 mL/min/{1.73_m2} (ref >59)

## 2021-06-15 LAB — LIPID PANEL
Chol/HDL Ratio: 2.3 ratio (ref 0.0–4.4)
Cholesterol, Total: 131 mg/dL (ref 100–199)
HDL: 56 mg/dL (ref >39)
LDL Chol Calc (NIH): 58 mg/dL (ref 0–99)
Triglycerides: 88 mg/dL (ref 0–149)
VLDL Cholesterol Cal: 17 mg/dL (ref 5–40)

## 2021-06-15 LAB — HEMOGLOBIN A1C
Est. average glucose Bld gHb Est-mCnc: 108 mg/dL
Hgb A1c MFr Bld: 5.4 % (ref 4.8–5.6)

## 2021-06-22 ENCOUNTER — Ambulatory Visit (INDEPENDENT_AMBULATORY_CARE_PROVIDER_SITE_OTHER): Payer: Medicare Other | Admitting: Cardiovascular Disease

## 2021-06-22 ENCOUNTER — Encounter (HOSPITAL_BASED_OUTPATIENT_CLINIC_OR_DEPARTMENT_OTHER): Payer: Self-pay | Admitting: Cardiovascular Disease

## 2021-06-22 VITALS — BP 128/80 | HR 79 | Ht 67.5 in | Wt 156.5 lb

## 2021-06-22 DIAGNOSIS — E7800 Pure hypercholesterolemia, unspecified: Secondary | ICD-10-CM | POA: Diagnosis not present

## 2021-06-22 DIAGNOSIS — Z5181 Encounter for therapeutic drug level monitoring: Secondary | ICD-10-CM | POA: Diagnosis not present

## 2021-06-22 DIAGNOSIS — I2510 Atherosclerotic heart disease of native coronary artery without angina pectoris: Secondary | ICD-10-CM | POA: Diagnosis not present

## 2021-06-22 DIAGNOSIS — I700 Atherosclerosis of aorta: Secondary | ICD-10-CM | POA: Diagnosis not present
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
## 2021-06-22 NOTE — Patient Instructions (Signed)
Medication Instructions:  ?Your physician recommends that you continue on your current medications as directed. Please refer to the Current Medication list given to you today.  ? ?*If you need a refill on your cardiac medications before your next appointment, please call your pharmacy* ? ?Lab Work: ?FASTING LP/CMET IN 6 MONTHS  ? ?If you have labs (blood work) drawn today and your tests are completely normal, you will receive your results only by: ?MyChart Message (if you have MyChart) OR ?A paper copy in the mail ?If you have any lab test that is abnormal or we need to change your treatment, we will call you to review the results. ? ?Testing/Procedures: ?NONE ? ?Follow-Up: ?At [facility], you and your health needs are our priority.  As part of our continuing mission to provide you with exceptional heart care, we have created designated Provider Care Teams.  These Care Teams include your primary Cardiologist (physician) and Advanced Practice Providers (APPs -  Physician Assistants and Nurse Practitioners) who all work together to provide you with the care you need, when you need it. ? ?We recommend signing up for the patient portal called "MyChart".  Sign up information is provided on this After Visit Summary.  MyChart is used to connect with patients for Virtual Visits (Telemedicine).  Patients are able to view lab/test results, encounter notes, upcoming appointments, etc.  Non-urgent messages can be sent to your provider as well.   ?To learn more about what you can do with MyChart, go to [email or web address].   ? ?Your next appointment:   ?12 month(s) ? ?The format for your next appointment:   ?In Person ? ?Provider:   ?Skeet Latch, MD  ? ? ? ? ? ?

## 2021-06-29 ENCOUNTER — Ambulatory Visit (HOSPITAL_BASED_OUTPATIENT_CLINIC_OR_DEPARTMENT_OTHER): Payer: Medicare Other | Admitting: Cardiovascular Disease

## 2021-07-07 ENCOUNTER — Other Ambulatory Visit: Payer: Self-pay | Admitting: Family Medicine

## 2021-07-07 ENCOUNTER — Ambulatory Visit
Admission: RE | Admit: 2021-07-07 | Discharge: 2021-07-07 | Disposition: A | Discharge status: Home / Self Care | Payer: Medicare Other | Source: Ambulatory Visit | Attending: Family Medicine | Admitting: Family Medicine

## 2021-07-07 DIAGNOSIS — R52 Pain, unspecified: Secondary | ICD-10-CM

## 2021-10-30 ENCOUNTER — Encounter (HOSPITAL_BASED_OUTPATIENT_CLINIC_OR_DEPARTMENT_OTHER): Payer: Self-pay

## 2021-10-30 ENCOUNTER — Emergency Department (HOSPITAL_BASED_OUTPATIENT_CLINIC_OR_DEPARTMENT_OTHER)
Admission: EM | Admit: 2021-10-30 | Discharge: 2021-10-30 | Disposition: A | Discharge status: Home / Self Care | Payer: Medicare Other | Attending: Emergency Medicine | Admitting: Emergency Medicine

## 2021-10-30 ENCOUNTER — Emergency Department (HOSPITAL_BASED_OUTPATIENT_CLINIC_OR_DEPARTMENT_OTHER): Payer: Medicare Other | Admitting: Radiology

## 2021-10-30 DIAGNOSIS — J45909 Unspecified asthma, uncomplicated: Secondary | ICD-10-CM | POA: Insufficient documentation

## 2021-10-30 DIAGNOSIS — M25551 Pain in right hip: Secondary | ICD-10-CM | POA: Diagnosis not present

## 2021-10-30 DIAGNOSIS — Y92410 Unspecified street and highway as the place of occurrence of the external cause: Secondary | ICD-10-CM | POA: Insufficient documentation

## 2021-10-30 MED ORDER — CYCLOBENZAPRINE HCL 5 MG PO TABS
5.0000 mg | ORAL_TABLET | Freq: Once | ORAL | Status: AC
Start: 2021-10-30 — End: 2021-10-30
  Administered 2021-10-30: 5 mg via ORAL
  Filled 2021-10-30: qty 1

## 2021-10-30 MED ORDER — CYCLOBENZAPRINE HCL 5 MG PO TABS: 5.0000 mg | ORAL_TABLET | Freq: Two times a day (BID) | ORAL | 0 refills | Status: DC | PRN

## 2021-10-30 MED ORDER — LIDOCAINE 5 % EX PTCH
1.0000 | MEDICATED_PATCH | CUTANEOUS | Status: DC
  Administered 2021-10-30: 1 via TRANSDERMAL
  Filled 2021-10-30: qty 1

## 2021-10-30 MED ORDER — ACETAMINOPHEN 325 MG PO TABS
650.0000 mg | ORAL_TABLET | Freq: Once | ORAL | Status: AC
Start: 2021-10-30 — End: 2021-10-30
  Administered 2021-10-30: 650 mg via ORAL
  Filled 2021-10-30: qty 2
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
## 2021-10-30 NOTE — Discharge Instructions (Signed)
Today you were seen in the emergency department for your hip pain.    In the emergency department you had x-rays that did not show any broken bones were given medications to treat your pain.    It is normal for the pain to worsen over the next few days so please expect this.  At home, please take Tylenol and over-the-counter lidocaine patches as needed for the pain.  You may also take cyclobenzaprine (muscle relaxer) that you are prescribed as needed for any additional pain that you have.  Do not take this while driving or operating heavy machinery as it can make you drowsy.  You may also use ice for your hip.  Follow-up with your primary doctor in 2-3 days regarding your visit.    Return immediately to the emergency department if you experience any of the following: Severe pain, difficulty walking, or any other concerning symptoms.    Thank you for visiting our Emergency Department. It was a pleasure taking care of you today.

## 2021-12-21 LAB — COMPREHENSIVE METABOLIC PANEL
ALT: 10 IU/L (ref 0–32)
AST: 13 IU/L (ref 0–40)
Albumin/Globulin Ratio: 1.9 (ref 1.2–2.2)
Albumin: 4.3 g/dL (ref 3.9–4.9)
Alkaline Phosphatase: 85 IU/L (ref 44–121)
BUN/Creatinine Ratio: 16 (ref 12–28)
BUN: 10 mg/dL (ref 8–27)
Bilirubin Total: 0.6 mg/dL (ref 0.0–1.2)
CO2: 26 mmol/L (ref 20–29)
Calcium: 9.6 mg/dL (ref 8.7–10.3)
Chloride: 104 mmol/L (ref 96–106)
Creatinine, Ser: 0.61 mg/dL (ref 0.57–1.00)
Globulin, Total: 2.3 g/dL (ref 1.5–4.5)
Glucose: 88 mg/dL (ref 70–99)
Potassium: 4.5 mmol/L (ref 3.5–5.2)
Sodium: 142 mmol/L (ref 134–144)
Total Protein: 6.6 g/dL (ref 6.0–8.5)
eGFR: 97 mL/min/{1.73_m2} (ref >59)

## 2021-12-21 LAB — LIPID PANEL
Chol/HDL Ratio: 2.3 ratio (ref 0.0–4.4)
Cholesterol, Total: 149 mg/dL (ref 100–199)
HDL: 66 mg/dL (ref >39)
LDL Chol Calc (NIH): 69 mg/dL (ref 0–99)
Triglycerides: 73 mg/dL (ref 0–149)
VLDL Cholesterol Cal: 14 mg/dL (ref 5–40)

## 2022-03-26 ENCOUNTER — Other Ambulatory Visit (HOSPITAL_BASED_OUTPATIENT_CLINIC_OR_DEPARTMENT_OTHER): Payer: Self-pay | Admitting: Cardiovascular Disease

## 2022-03-27 NOTE — Telephone Encounter (Signed)
Rx(s) sent to pharmacy electronically.  

## 2022-06-26 ENCOUNTER — Ambulatory Visit (INDEPENDENT_AMBULATORY_CARE_PROVIDER_SITE_OTHER): Payer: Medicare Other | Admitting: Cardiovascular Disease

## 2022-06-26 ENCOUNTER — Encounter (HOSPITAL_BASED_OUTPATIENT_CLINIC_OR_DEPARTMENT_OTHER): Payer: Self-pay | Admitting: Cardiovascular Disease

## 2022-06-26 VITALS — BP 124/74 | HR 70 | Ht 67.0 in | Wt 150.2 lb

## 2022-06-26 DIAGNOSIS — I2510 Atherosclerotic heart disease of native coronary artery without angina pectoris: Secondary | ICD-10-CM

## 2022-06-26 DIAGNOSIS — I700 Atherosclerosis of aorta: Secondary | ICD-10-CM

## 2022-06-26 DIAGNOSIS — R7303 Prediabetes: Secondary | ICD-10-CM

## 2022-06-26 DIAGNOSIS — E7800 Pure hypercholesterolemia, unspecified: Secondary | ICD-10-CM | POA: Diagnosis not present

## 2022-06-26 DIAGNOSIS — Z5181 Encounter for therapeutic drug level monitoring: Secondary | ICD-10-CM

## 2022-06-26 NOTE — Addendum Note (Signed)
Addended by: Regis Bill B on: 06/26/2022 10:30 AM   Modules accepted: Orders

## 2022-06-26 NOTE — Patient Instructions (Signed)
Medication Instructions:  Your physician recommends that you continue on your current medications as directed. Please refer to the Current Medication list given to you today.   *If you need a refill on your cardiac medications before your next appointment, please call your pharmacy*  Lab Work: FASTING LP/CMET/A1C SOON   If you have labs (blood work) drawn today and your tests are completely normal, you will receive your results only by: MyChart Message (if you have MyChart) OR A paper copy in the mail If you have any lab test that is abnormal or we need to change your treatment, we will call you to review the results.  Testing/Procedures: NONE  Follow-Up: At [facility], you and your health needs are our priority.  As part of our continuing mission to provide you with exceptional heart care, we have created designated Provider Care Teams.  These Care Teams include your primary Cardiologist (physician) and Advanced Practice Providers (APPs -  Physician Assistants and Nurse Practitioners) who all work together to provide you with the care you need, when you need it.  We recommend signing up for the patient portal called "MyChart".  Sign up information is provided on this After Visit Summary.  MyChart is used to connect with patients for Virtual Visits (Telemedicine).  Patients are able to view lab/test results, encounter notes, upcoming appointments, etc.  Non-urgent messages can be sent to your provider as well.   To learn more about what you can do with MyChart, go to [email or web address].    Your next appointment:   12 month(s)  Provider:   Chilton Si, MD
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
## 2022-06-27 LAB — COMPREHENSIVE METABOLIC PANEL
ALT: 9 IU/L (ref 0–32)
AST: 14 IU/L (ref 0–40)
Albumin/Globulin Ratio: 2.3 — ABNORMAL HIGH (ref 1.2–2.2)
Albumin: 4.2 g/dL (ref 3.9–4.9)
Alkaline Phosphatase: 82 IU/L (ref 44–121)
BUN/Creatinine Ratio: 17 (ref 12–28)
BUN: 11 mg/dL (ref 8–27)
Bilirubin Total: 0.7 mg/dL (ref 0.0–1.2)
CO2: 28 mmol/L (ref 20–29)
Calcium: 9.9 mg/dL (ref 8.7–10.3)
Chloride: 104 mmol/L (ref 96–106)
Creatinine, Ser: 0.64 mg/dL (ref 0.57–1.00)
Globulin, Total: 1.8 g/dL (ref 1.5–4.5)
Glucose: 89 mg/dL (ref 70–99)
Potassium: 5.3 mmol/L — ABNORMAL HIGH (ref 3.5–5.2)
Sodium: 143 mmol/L (ref 134–144)
Total Protein: 6 g/dL (ref 6.0–8.5)
eGFR: 96 mL/min/{1.73_m2} (ref >59)

## 2022-06-27 LAB — LIPID PANEL
Chol/HDL Ratio: 2.1 ratio (ref 0.0–4.4)
Cholesterol, Total: 159 mg/dL (ref 100–199)
HDL: 76 mg/dL (ref >39)
LDL Chol Calc (NIH): 69 mg/dL (ref 0–99)
Triglycerides: 70 mg/dL (ref 0–149)
VLDL Cholesterol Cal: 14 mg/dL (ref 5–40)

## 2022-06-27 LAB — HEMOGLOBIN A1C
Est. average glucose Bld gHb Est-mCnc: 117 mg/dL
Hgb A1c MFr Bld: 5.7 % — ABNORMAL HIGH (ref 4.8–5.6)

## 2022-06-29 ENCOUNTER — Other Ambulatory Visit (HOSPITAL_BASED_OUTPATIENT_CLINIC_OR_DEPARTMENT_OTHER): Payer: Self-pay | Admitting: Cardiovascular Disease

## 2022-07-07 ENCOUNTER — Telehealth: Payer: Self-pay | Admitting: Cardiovascular Disease

## 2022-07-07 NOTE — Telephone Encounter (Signed)
Pt calling about lab results. She states she is concerned and wants to know if she can cut her cholesterol med in half. Please advise.

## 2022-07-12 ENCOUNTER — Telehealth: Payer: Self-pay | Admitting: Cardiovascular Disease

## 2022-07-12 DIAGNOSIS — Z79899 Other long term (current) drug therapy: Secondary | ICD-10-CM

## 2022-07-12 NOTE — Telephone Encounter (Signed)
Pt calling in regards to paperwork for repeat labs. Please advise

## 2022-07-12 NOTE — Telephone Encounter (Signed)
Orders placed for repeat BMP.

## 2022-07-13 ENCOUNTER — Telehealth: Payer: Self-pay | Admitting: Cardiovascular Disease

## 2022-07-13 ENCOUNTER — Other Ambulatory Visit (HOSPITAL_BASED_OUTPATIENT_CLINIC_OR_DEPARTMENT_OTHER): Payer: Self-pay | Admitting: Cardiovascular Disease

## 2022-07-13 LAB — BASIC METABOLIC PANEL
BUN/Creatinine Ratio: 20 (ref 12–28)
BUN: 14 mg/dL (ref 8–27)
CO2: 27 mmol/L (ref 20–29)
Calcium: 10.2 mg/dL (ref 8.7–10.3)
Chloride: 101 mmol/L (ref 96–106)
Creatinine, Ser: 0.69 mg/dL (ref 0.57–1.00)
Glucose: 91 mg/dL (ref 70–99)
Potassium: 6.2 mmol/L (ref 3.5–5.2)
Sodium: 140 mmol/L (ref 134–144)
eGFR: 94 mL/min/{1.73_m2} (ref >59)

## 2022-07-13 NOTE — Telephone Encounter (Signed)
Contacted by [employer] re: critical K value of 6.2 Pt had CMP on 06-27-22 showing K was 5.3. Repeat BMP done 07-12-22 shows K was 6.2. Pt does not appear to be on any K supplement or K-sparing medications. Attempted to contact pt via phone but was unable to speak with her.  Will forward to ordering provider  Precious Reel, MD , [facility] 07/13/22 10:30 PM

## 2022-07-14 ENCOUNTER — Telehealth (HOSPITAL_BASED_OUTPATIENT_CLINIC_OR_DEPARTMENT_OTHER): Payer: Self-pay | Admitting: Cardiovascular Disease

## 2022-07-14 ENCOUNTER — Telehealth (HOSPITAL_BASED_OUTPATIENT_CLINIC_OR_DEPARTMENT_OTHER): Payer: Self-pay

## 2022-07-14 DIAGNOSIS — Z5181 Encounter for therapeutic drug level monitoring: Secondary | ICD-10-CM

## 2022-07-14 DIAGNOSIS — E875 Hyperkalemia: Secondary | ICD-10-CM

## 2022-07-14 MED ORDER — LOKELMA 10 G PO PACK: 10.0000 g | PACK | Freq: Every day | ORAL | 0 refills | Status: DC

## 2022-07-14 NOTE — Telephone Encounter (Signed)
See result note from today, copied below.  Alver Sorrow, NP _____________  Carolyn Gutierrez patient, reviewed the following information. Patient states she does sometimes eat avocado daily, counseled patient to reduce intake while trying to get potassium settled. Patient agreeable to come  get samples to take through the weekend and have labs rechecked Monday or Tuesday.   Alver Sorrow, NP 07/14/2022  1:26 PM EDT     Potassium remains elevated. Avoid salt substitute, electrolyte drinks, potassium supplement to avoid increased potassium. Reduce intake of potassium rich foods such as potatoes, avocados, cantaloupe, bananas.   Recommend Lokelma 10g daily x 3 days. Repeat BMP Monday or Tuesday for monitoring.   May provide samples if needed

## 2022-07-14 NOTE — Telephone Encounter (Signed)
Dr. Duke Salvia out of office, patient k+ 6.2, please review labs

## 2022-07-14 NOTE — Telephone Encounter (Addendum)
Called patient, reviewed the following information. Patient states she does sometimes eat avocado daily, counseled patient to reduce intake while trying to get potassium settled. Patient agreeable to come  get samples to take through the weekend and have labs rechecked Monday or Tuesday.     ----- Message from Alver Sorrow, NP sent at 07/14/2022  1:26 PM EDT ----- Potassium remains elevated.  Avoid salt substitute, electrolyte drinks, potassium supplement to avoid increased potassium. Reduce intake of potassium rich foods such as potatoes, avocados, cantaloupe, bananas.   Recommend Lokelma 10g daily x 3 days. Repeat BMP Monday or Tuesday for monitoring.  May provide samples if needed

## 2022-07-14 NOTE — Telephone Encounter (Signed)
Patient calling for her lab results.  She is questioning her potassium level.  She wants to know if she is in any danger, since it's 6.2.

## 2022-07-18 LAB — BASIC METABOLIC PANEL
BUN/Creatinine Ratio: 16 (ref 12–28)
BUN: 10 mg/dL (ref 8–27)
CO2: 27 mmol/L (ref 20–29)
Calcium: 9.6 mg/dL (ref 8.7–10.3)
Chloride: 102 mmol/L (ref 96–106)
Creatinine, Ser: 0.62 mg/dL (ref 0.57–1.00)
Glucose: 84 mg/dL (ref 70–99)
Potassium: 4.8 mmol/L (ref 3.5–5.2)
Sodium: 141 mmol/L (ref 134–144)
eGFR: 97 mL/min/{1.73_m2} (ref >59)

## 2022-07-20 ENCOUNTER — Telehealth (HOSPITAL_BASED_OUTPATIENT_CLINIC_OR_DEPARTMENT_OTHER): Payer: Self-pay

## 2022-07-20 DIAGNOSIS — Z5181 Encounter for therapeutic drug level monitoring: Secondary | ICD-10-CM

## 2022-07-20 DIAGNOSIS — Z79899 Other long term (current) drug therapy: Secondary | ICD-10-CM

## 2022-07-20 NOTE — Telephone Encounter (Addendum)
Seen by patient Carolyn Gutierrez on 07/20/2022  1:40 PM; follow up mychart message sent to patient in regards to labs, labs ordered and mailed to patient.    ----- Message from Alver Sorrow, NP sent at 07/20/2022  1:26 PM EDT ----- Normal kidney function.  Potassium has improved to normal.  Good result.  Repeat BMP in 2 weeks for monitoring.

## 2022-08-08 LAB — BASIC METABOLIC PANEL
BUN/Creatinine Ratio: 19 (ref 12–28)
BUN: 11 mg/dL (ref 8–27)
CO2: 25 mmol/L (ref 20–29)
Calcium: 9.7 mg/dL (ref 8.7–10.3)
Chloride: 100 mmol/L (ref 96–106)
Creatinine, Ser: 0.58 mg/dL (ref 0.57–1.00)
Glucose: 81 mg/dL (ref 70–99)
Potassium: 4.8 mmol/L (ref 3.5–5.2)
Sodium: 139 mmol/L (ref 134–144)
eGFR: 99 mL/min/{1.73_m2} (ref >59)

## 2023-05-30 ENCOUNTER — Ambulatory Visit (HOSPITAL_BASED_OUTPATIENT_CLINIC_OR_DEPARTMENT_OTHER): Attending: Orthopedic Surgery | Admitting: Physical Therapy

## 2023-05-30 ENCOUNTER — Other Ambulatory Visit: Payer: Self-pay

## 2023-05-30 ENCOUNTER — Encounter (HOSPITAL_BASED_OUTPATIENT_CLINIC_OR_DEPARTMENT_OTHER): Payer: Self-pay | Admitting: Physical Therapy

## 2023-05-30 DIAGNOSIS — M25551 Pain in right hip: Secondary | ICD-10-CM | POA: Insufficient documentation

## 2023-05-30 DIAGNOSIS — M6281 Muscle weakness (generalized): Secondary | ICD-10-CM | POA: Diagnosis present

## 2023-05-30 DIAGNOSIS — R2689 Other abnormalities of gait and mobility: Secondary | ICD-10-CM | POA: Insufficient documentation

## 2023-05-30 DIAGNOSIS — R29898 Other symptoms and signs involving the musculoskeletal system: Secondary | ICD-10-CM | POA: Insufficient documentation
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
## 2023-06-06 ENCOUNTER — Encounter (HOSPITAL_BASED_OUTPATIENT_CLINIC_OR_DEPARTMENT_OTHER): Payer: Self-pay | Admitting: Physical Therapy

## 2023-06-06 ENCOUNTER — Ambulatory Visit (HOSPITAL_BASED_OUTPATIENT_CLINIC_OR_DEPARTMENT_OTHER): Admitting: Physical Therapy

## 2023-06-06 DIAGNOSIS — M6281 Muscle weakness (generalized): Secondary | ICD-10-CM

## 2023-06-06 DIAGNOSIS — M25551 Pain in right hip: Secondary | ICD-10-CM
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
## 2023-06-12 ENCOUNTER — Ambulatory Visit (HOSPITAL_BASED_OUTPATIENT_CLINIC_OR_DEPARTMENT_OTHER): Attending: Orthopedic Surgery | Admitting: Physical Therapy

## 2023-06-12 ENCOUNTER — Encounter (HOSPITAL_BASED_OUTPATIENT_CLINIC_OR_DEPARTMENT_OTHER): Payer: Self-pay | Admitting: Physical Therapy

## 2023-06-12 DIAGNOSIS — M25551 Pain in right hip: Secondary | ICD-10-CM | POA: Insufficient documentation

## 2023-06-12 DIAGNOSIS — R29898 Other symptoms and signs involving the musculoskeletal system: Secondary | ICD-10-CM | POA: Insufficient documentation

## 2023-06-12 DIAGNOSIS — R2689 Other abnormalities of gait and mobility: Secondary | ICD-10-CM | POA: Diagnosis present

## 2023-06-12 DIAGNOSIS — M6281 Muscle weakness (generalized): Secondary | ICD-10-CM | POA: Insufficient documentation
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
## 2023-06-20 ENCOUNTER — Ambulatory Visit (HOSPITAL_BASED_OUTPATIENT_CLINIC_OR_DEPARTMENT_OTHER): Admitting: Physical Therapy

## 2023-06-21 ENCOUNTER — Ambulatory Visit (HOSPITAL_BASED_OUTPATIENT_CLINIC_OR_DEPARTMENT_OTHER): Admitting: Physical Therapy

## 2023-06-21 ENCOUNTER — Encounter (HOSPITAL_BASED_OUTPATIENT_CLINIC_OR_DEPARTMENT_OTHER): Payer: Self-pay | Admitting: Physical Therapy

## 2023-06-21 DIAGNOSIS — M6281 Muscle weakness (generalized): Secondary | ICD-10-CM

## 2023-06-21 DIAGNOSIS — M25551 Pain in right hip: Secondary | ICD-10-CM | POA: Diagnosis not present

## 2023-06-21 DIAGNOSIS — R29898 Other symptoms and signs involving the musculoskeletal system: Secondary | ICD-10-CM

## 2023-06-21 DIAGNOSIS — R2689 Other abnormalities of gait and mobility: Secondary | ICD-10-CM
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
## 2023-06-25 ENCOUNTER — Ambulatory Visit (HOSPITAL_BASED_OUTPATIENT_CLINIC_OR_DEPARTMENT_OTHER): Admitting: Physical Therapy

## 2023-06-25 ENCOUNTER — Encounter (HOSPITAL_BASED_OUTPATIENT_CLINIC_OR_DEPARTMENT_OTHER): Payer: Self-pay | Admitting: Physical Therapy

## 2023-06-25 DIAGNOSIS — M25551 Pain in right hip: Secondary | ICD-10-CM

## 2023-06-25 DIAGNOSIS — R29898 Other symptoms and signs involving the musculoskeletal system: Secondary | ICD-10-CM

## 2023-06-25 DIAGNOSIS — M6281 Muscle weakness (generalized): Secondary | ICD-10-CM

## 2023-06-25 DIAGNOSIS — R2689 Other abnormalities of gait and mobility: Secondary | ICD-10-CM
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
## 2023-06-28 ENCOUNTER — Ambulatory Visit (HOSPITAL_BASED_OUTPATIENT_CLINIC_OR_DEPARTMENT_OTHER): Admitting: Physical Therapy

## 2023-06-28 ENCOUNTER — Encounter (HOSPITAL_BASED_OUTPATIENT_CLINIC_OR_DEPARTMENT_OTHER): Payer: Self-pay | Admitting: Physical Therapy

## 2023-06-28 DIAGNOSIS — R2689 Other abnormalities of gait and mobility: Secondary | ICD-10-CM

## 2023-06-28 DIAGNOSIS — M25551 Pain in right hip: Secondary | ICD-10-CM

## 2023-06-28 DIAGNOSIS — M6281 Muscle weakness (generalized): Secondary | ICD-10-CM

## 2023-06-28 DIAGNOSIS — R29898 Other symptoms and signs involving the musculoskeletal system: Secondary | ICD-10-CM
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
## 2023-07-03 ENCOUNTER — Ambulatory Visit (HOSPITAL_BASED_OUTPATIENT_CLINIC_OR_DEPARTMENT_OTHER): Admitting: Physical Therapy

## 2023-07-03 ENCOUNTER — Encounter (HOSPITAL_BASED_OUTPATIENT_CLINIC_OR_DEPARTMENT_OTHER): Payer: Self-pay | Admitting: Physical Therapy

## 2023-07-03 DIAGNOSIS — R29898 Other symptoms and signs involving the musculoskeletal system: Secondary | ICD-10-CM

## 2023-07-03 DIAGNOSIS — R2689 Other abnormalities of gait and mobility: Secondary | ICD-10-CM

## 2023-07-03 DIAGNOSIS — M25551 Pain in right hip: Secondary | ICD-10-CM | POA: Diagnosis not present

## 2023-07-03 DIAGNOSIS — M6281 Muscle weakness (generalized): Secondary | ICD-10-CM
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
## 2023-07-05 ENCOUNTER — Encounter (HOSPITAL_BASED_OUTPATIENT_CLINIC_OR_DEPARTMENT_OTHER): Payer: Self-pay | Admitting: Physical Therapy

## 2023-07-05 ENCOUNTER — Ambulatory Visit (HOSPITAL_BASED_OUTPATIENT_CLINIC_OR_DEPARTMENT_OTHER): Admitting: Physical Therapy

## 2023-07-05 DIAGNOSIS — R2689 Other abnormalities of gait and mobility: Secondary | ICD-10-CM

## 2023-07-05 DIAGNOSIS — M25551 Pain in right hip: Secondary | ICD-10-CM | POA: Diagnosis not present

## 2023-07-05 DIAGNOSIS — R29898 Other symptoms and signs involving the musculoskeletal system: Secondary | ICD-10-CM

## 2023-07-05 DIAGNOSIS — M6281 Muscle weakness (generalized): Secondary | ICD-10-CM
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
## 2023-07-10 ENCOUNTER — Ambulatory Visit (HOSPITAL_BASED_OUTPATIENT_CLINIC_OR_DEPARTMENT_OTHER): Attending: Orthopedic Surgery | Admitting: Physical Therapy

## 2023-07-10 ENCOUNTER — Encounter (HOSPITAL_BASED_OUTPATIENT_CLINIC_OR_DEPARTMENT_OTHER): Payer: Self-pay | Admitting: Physical Therapy

## 2023-07-10 DIAGNOSIS — R29898 Other symptoms and signs involving the musculoskeletal system: Secondary | ICD-10-CM | POA: Insufficient documentation

## 2023-07-10 DIAGNOSIS — R2689 Other abnormalities of gait and mobility: Secondary | ICD-10-CM | POA: Insufficient documentation

## 2023-07-10 DIAGNOSIS — M25551 Pain in right hip: Secondary | ICD-10-CM | POA: Diagnosis present

## 2023-07-10 DIAGNOSIS — M6281 Muscle weakness (generalized): Secondary | ICD-10-CM | POA: Insufficient documentation
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
## 2023-07-12 ENCOUNTER — Encounter (HOSPITAL_BASED_OUTPATIENT_CLINIC_OR_DEPARTMENT_OTHER): Admitting: Physical Therapy

## 2023-07-13 ENCOUNTER — Encounter (HOSPITAL_BASED_OUTPATIENT_CLINIC_OR_DEPARTMENT_OTHER): Payer: Self-pay | Admitting: Cardiovascular Disease

## 2023-07-13 ENCOUNTER — Ambulatory Visit (HOSPITAL_BASED_OUTPATIENT_CLINIC_OR_DEPARTMENT_OTHER): Payer: Medicare Other | Admitting: Cardiovascular Disease

## 2023-07-13 VITALS — BP 122/76 | HR 79 | Ht 68.0 in | Wt 151.0 lb

## 2023-07-13 DIAGNOSIS — E7800 Pure hypercholesterolemia, unspecified: Secondary | ICD-10-CM

## 2023-07-13 DIAGNOSIS — I700 Atherosclerosis of aorta: Secondary | ICD-10-CM | POA: Diagnosis not present

## 2023-07-13 DIAGNOSIS — I2510 Atherosclerotic heart disease of native coronary artery without angina pectoris: Secondary | ICD-10-CM

## 2023-07-13 DIAGNOSIS — Z5181 Encounter for therapeutic drug level monitoring: Secondary | ICD-10-CM

## 2023-07-13 NOTE — Patient Instructions (Addendum)
 Medication Instructions:  Your physician recommends that you continue on your current medications as directed. Please refer to the Current Medication list given to you today.   Labwork: LP/CMET TODAY   Testing/Procedures: NONE  Follow-Up: AS NEEDED   Any Other Special Instructions Will Be Listed Below (If Applicable). TALK WITH YOUR PRIMARY CARE REGARDING FILLING YOUR ROSUVASTATIN  AFTER 1 YEAR    If you need a refill on your cardiac medications before your next appointment, please call your pharmacy.
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
## 2023-07-14 ENCOUNTER — Ambulatory Visit: Payer: Self-pay | Admitting: Cardiovascular Disease

## 2023-07-14 LAB — COMPREHENSIVE METABOLIC PANEL WITH GFR
ALT: 8 IU/L (ref 0–32)
AST: 13 IU/L (ref 0–40)
Albumin: 4.4 g/dL (ref 3.9–4.9)
Alkaline Phosphatase: 91 IU/L (ref 44–121)
BUN/Creatinine Ratio: 17 (ref 12–28)
BUN: 10 mg/dL (ref 8–27)
Bilirubin Total: 0.6 mg/dL (ref 0.0–1.2)
CO2: 23 mmol/L (ref 20–29)
Calcium: 9.5 mg/dL (ref 8.7–10.3)
Chloride: 102 mmol/L (ref 96–106)
Creatinine, Ser: 0.58 mg/dL (ref 0.57–1.00)
Globulin, Total: 2.6 g/dL (ref 1.5–4.5)
Glucose: 92 mg/dL (ref 70–99)
Potassium: 4.6 mmol/L (ref 3.5–5.2)
Sodium: 142 mmol/L (ref 134–144)
Total Protein: 7 g/dL (ref 6.0–8.5)
eGFR: 98 mL/min/{1.73_m2} (ref >59)

## 2023-07-14 LAB — LIPID PANEL
Chol/HDL Ratio: 2.3 ratio (ref 0.0–4.4)
Cholesterol, Total: 162 mg/dL (ref 100–199)
HDL: 71 mg/dL (ref >39)
LDL Chol Calc (NIH): 73 mg/dL (ref 0–99)
Triglycerides: 102 mg/dL (ref 0–149)
VLDL Cholesterol Cal: 18 mg/dL (ref 5–40)

## 2023-07-17 ENCOUNTER — Encounter (HOSPITAL_BASED_OUTPATIENT_CLINIC_OR_DEPARTMENT_OTHER): Payer: Self-pay | Admitting: Physical Therapy

## 2023-07-17 ENCOUNTER — Ambulatory Visit (HOSPITAL_BASED_OUTPATIENT_CLINIC_OR_DEPARTMENT_OTHER): Admitting: Physical Therapy

## 2023-07-17 ENCOUNTER — Other Ambulatory Visit: Payer: Self-pay

## 2023-07-17 DIAGNOSIS — M6281 Muscle weakness (generalized): Secondary | ICD-10-CM

## 2023-07-17 DIAGNOSIS — M25551 Pain in right hip: Secondary | ICD-10-CM

## 2023-07-17 DIAGNOSIS — R2689 Other abnormalities of gait and mobility: Secondary | ICD-10-CM
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
## 2023-07-18 ENCOUNTER — Ambulatory Visit (HOSPITAL_BASED_OUTPATIENT_CLINIC_OR_DEPARTMENT_OTHER): Admitting: Physical Therapy

## 2023-07-18 ENCOUNTER — Encounter (HOSPITAL_BASED_OUTPATIENT_CLINIC_OR_DEPARTMENT_OTHER): Payer: Self-pay | Admitting: Physical Therapy

## 2023-07-18 DIAGNOSIS — R2689 Other abnormalities of gait and mobility: Secondary | ICD-10-CM

## 2023-07-18 DIAGNOSIS — M25551 Pain in right hip: Secondary | ICD-10-CM | POA: Diagnosis not present

## 2023-07-18 DIAGNOSIS — M6281 Muscle weakness (generalized): Secondary | ICD-10-CM
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
## 2023-07-19 ENCOUNTER — Encounter (HOSPITAL_BASED_OUTPATIENT_CLINIC_OR_DEPARTMENT_OTHER): Admitting: Physical Therapy

## 2023-07-20 ENCOUNTER — Ambulatory Visit (HOSPITAL_BASED_OUTPATIENT_CLINIC_OR_DEPARTMENT_OTHER): Admitting: Orthopaedic Surgery

## 2023-07-20 ENCOUNTER — Ambulatory Visit (INDEPENDENT_AMBULATORY_CARE_PROVIDER_SITE_OTHER): Payer: PRIVATE HEALTH INSURANCE

## 2023-07-20 ENCOUNTER — Encounter (HOSPITAL_BASED_OUTPATIENT_CLINIC_OR_DEPARTMENT_OTHER): Payer: Self-pay | Admitting: Orthopaedic Surgery

## 2023-07-20 DIAGNOSIS — M25551 Pain in right hip: Secondary | ICD-10-CM

## 2023-07-20 MED ORDER — LIDOCAINE HCL 1 % IJ SOLN
4.0000 mL | INTRAMUSCULAR | Status: AC | PRN
  Administered 2023-07-20: 4 mL

## 2023-07-20 MED ORDER — TRIAMCINOLONE ACETONIDE 40 MG/ML IJ SUSP
80.0000 mg | INTRAMUSCULAR | Status: AC | PRN
Start: 2023-07-20 — End: 2023-07-20
  Administered 2023-07-20: 80 mg via INTRA_ARTICULAR
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
## 2023-07-24 ENCOUNTER — Ambulatory Visit (HOSPITAL_BASED_OUTPATIENT_CLINIC_OR_DEPARTMENT_OTHER): Admitting: Physical Therapy

## 2023-07-26 ENCOUNTER — Encounter (HOSPITAL_BASED_OUTPATIENT_CLINIC_OR_DEPARTMENT_OTHER): Admitting: Physical Therapy

## 2023-08-03 ENCOUNTER — Ambulatory Visit (HOSPITAL_BASED_OUTPATIENT_CLINIC_OR_DEPARTMENT_OTHER): Admitting: Orthopaedic Surgery

## 2023-08-08 ENCOUNTER — Ambulatory Visit (INDEPENDENT_AMBULATORY_CARE_PROVIDER_SITE_OTHER): Payer: PRIVATE HEALTH INSURANCE | Admitting: Orthopaedic Surgery

## 2023-08-08 ENCOUNTER — Ambulatory Visit (HOSPITAL_BASED_OUTPATIENT_CLINIC_OR_DEPARTMENT_OTHER): Payer: Self-pay | Admitting: Orthopaedic Surgery

## 2023-08-08 DIAGNOSIS — M25551 Pain in right hip: Secondary | ICD-10-CM
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
## 2023-08-17 ENCOUNTER — Telehealth (HOSPITAL_BASED_OUTPATIENT_CLINIC_OR_DEPARTMENT_OTHER): Payer: Self-pay | Admitting: Orthopaedic Surgery

## 2023-08-17 NOTE — Telephone Encounter (Signed)
 Patient has questions about the surgery and has a question about her hip

## 2023-08-17 NOTE — Telephone Encounter (Signed)
 Just FYI Returned call to patient answering all her questions:   She asked about crutch usage post op.. I reiterated your rehab protocol and explained patients are often FWB without crutches at their 2 week post op appointment  I explained the surgical technique for the hip arthroscopy with labral repair  Informed her she does not need any special DME following this surgery (ie brace, commode, etc.)  Confirmed she will likely need 6-12 weeks of PT  I explained driving precautions including safely exiting vehicle and no usage of narcotic 48 hours prior  She asked  about the success rate of this surgery and I informed her your patients often do very well with this surgery

## 2023-08-24 ENCOUNTER — Other Ambulatory Visit (HOSPITAL_BASED_OUTPATIENT_CLINIC_OR_DEPARTMENT_OTHER): Payer: Self-pay | Admitting: Cardiovascular Disease

## 2023-09-05 ENCOUNTER — Ambulatory Visit (INDEPENDENT_AMBULATORY_CARE_PROVIDER_SITE_OTHER): Payer: PRIVATE HEALTH INSURANCE | Admitting: Orthopaedic Surgery

## 2023-09-05 DIAGNOSIS — S76302A Unspecified injury of muscle, fascia and tendon of the posterior muscle group at thigh level, left thigh, initial encounter: Secondary | ICD-10-CM | POA: Diagnosis not present
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
## 2023-09-05 NOTE — Addendum Note (Signed)
 Addended by: WOLFGANG CONLEY HERO on: 09/05/2023 05:34 PM   Modules accepted: Orders

## 2023-09-07 ENCOUNTER — Ambulatory Visit (HOSPITAL_BASED_OUTPATIENT_CLINIC_OR_DEPARTMENT_OTHER): Payer: PRIVATE HEALTH INSURANCE | Attending: Orthopedic Surgery | Admitting: Physical Therapy

## 2023-09-07 ENCOUNTER — Encounter (HOSPITAL_BASED_OUTPATIENT_CLINIC_OR_DEPARTMENT_OTHER): Payer: Self-pay | Admitting: Physical Therapy

## 2023-09-07 ENCOUNTER — Other Ambulatory Visit: Payer: Self-pay

## 2023-09-07 DIAGNOSIS — M6281 Muscle weakness (generalized): Secondary | ICD-10-CM | POA: Insufficient documentation

## 2023-09-07 DIAGNOSIS — S76302A Unspecified injury of muscle, fascia and tendon of the posterior muscle group at thigh level, left thigh, initial encounter: Secondary | ICD-10-CM | POA: Diagnosis present

## 2023-09-07 DIAGNOSIS — R29898 Other symptoms and signs involving the musculoskeletal system: Secondary | ICD-10-CM | POA: Insufficient documentation

## 2023-09-07 DIAGNOSIS — R2689 Other abnormalities of gait and mobility: Secondary | ICD-10-CM | POA: Insufficient documentation

## 2023-09-07 NOTE — Therapy (Signed)
 OUTPATIENT PHYSICAL THERAPY LOWER EXTREMITY EVALUATION   Patient Name: Carolyn Gutierrez MRN: [id number] DOB:08-12-1955, 68 y.o., female Today's Date: 09/07/2023  END OF SESSION:  PT End of Session - 09/07/23 1114     Visit Number 1    Number of Visits 6    Date for PT Re-Evaluation 09/28/23    Authorization Type Medicare    Progress Note Due on Visit 10    PT Start Time 1111    PT Stop Time 1140    PT Time Calculation (min) 29 min    Activity Tolerance Patient tolerated treatment well    Behavior During Therapy [facility] for tasks assessed/performed          Past Medical History:  Diagnosis Date   Allergy    Anxiety    Aortic atherosclerosis (HCC) 03/30/2021   Asthma    in the past- no inhaler currently   Depression    Elevated blood pressure reading 03/30/2021   Genital warts    Hay fever    Migraine    Pure hypercholesterolemia 03/30/2021   Stress fracture of right foot    Past Surgical History:  Procedure Laterality Date   ABDOMINAL HYSTERECTOMY  01/17/2012   Procedure: HYSTERECTOMY ABDOMINAL;  Surgeon: Rosaline LITTIE Cobble, MD;  Location: [facility];  Service: Gynecology;  Laterality: N/A;   BREAST SURGERY     breast bx   COLONOSCOPY     11 yr ago with dr coleman ? a few polyps, no report   KNEE SURGERY     right   LAPAROSCOPIC ASSISTED VAGINAL HYSTERECTOMY  01/17/2012   Procedure: LAPAROSCOPIC ASSISTED VAGINAL HYSTERECTOMY;  Surgeon: Rosaline LITTIE Cobble, MD;  Location: [facility];  Service: Gynecology;  Laterality: N/A;  attempted    SALPINGOOPHORECTOMY  01/17/2012   Procedure: SALPINGO OOPHORECTOMY;  Surgeon: Rosaline LITTIE Cobble, MD;  Location: [facility];  Service: Gynecology;  Laterality: Right;   Patient Active Problem List   Diagnosis Date Noted   CAD in native artery 03/30/2021   Elevated blood pressure reading 03/30/2021   Pure hypercholesterolemia 03/30/2021   Aortic atherosclerosis (HCC) 03/30/2021   Overweight (BMI 25.0-29.9) 08/20/2017   Vertigo 08/15/2017    Pulmonary nodule 04/30/2017   Acute bilateral low back pain without sciatica 04/30/2017   Plantar fasciitis, right 04/09/2017   Osteopenia 07/07/2015   Headache, migraine 03/30/2015   Delusional disorder (HCC) 11/17/2013    PCP: Burney Darice LITTIE, MD   REFERRING PROVIDER: Genelle Standing, MD  REFERRING DIAG:  left hamstring tendinitis; D23.697J (ICD-10-CM) - Left hamstring injury, initial encounter  THERAPY DIAG:  Muscle weakness (generalized)  Other abnormalities of gait and mobility  Other symptoms and signs involving the musculoskeletal system  Left hamstring injury, initial encounter  Rationale for Evaluation and Treatment: Rehabilitation  ONSET DATE: June 2025  SUBJECTIVE:   SUBJECTIVE STATEMENT: Patient states hamstring pain that came on a few weeks ago. Pain in L hamstring/glutes. Notes increased pain with sitting, bending, kicking boxes out of the way. Occasionally pins and needles in the glutes. Has tried ice, heat, etc without relief.   PERTINENT HISTORY: Hx osteopenia, hx LBP, HLD  PAIN:  Are you having pain? Yes: NPRS scale: 4/10 Pain location: L hamstring Pain description: sore Aggravating factors: bending Relieving factors: walking  PRECAUTIONS: None  WEIGHT BEARING RESTRICTIONS: No  FALLS:  Has patient fallen in last 6 months? No    PLOF: Independent  PATIENT GOALS: get rid of hamstring pain.   OBJECTIVE: (objective measures from initial  evaluation unless otherwise dated)   PATIENT SURVEYS:  Complete next session  COGNITION: Overall cognitive status: Within functional limits for tasks assessed      POSTURE: No Significant postural limitations  PALPATION: TTP L glute max, piriformis, minimally at L hamstring origin  LOWER EXTREMITY ROM: WFL for tasks assessed, trunk flexion - pain in L glute max  Active ROM Right eval Left eval  Hip flexion    Hip extension    Hip abduction    Hip adduction    Hip internal rotation    Hip  external rotation    Knee flexion    Knee extension    Ankle dorsiflexion    Ankle plantarflexion    Ankle inversion    Ankle eversion     (Blank rows = not tested) *= pain/symptoms  LOWER EXTREMITY MMT:  MMT Right eval Left eval  Hip flexion 5 5  Hip extension 4+ 5  Hip abduction 4+ 5  Hip adduction    Hip internal rotation    Hip external rotation    Knee flexion 5 5  Knee extension 5 5  Ankle dorsiflexion    Ankle plantarflexion    Ankle inversion    Ankle eversion     (Blank rows = not tested) *= pain/symptoms   FUNCTIONAL TESTS:  NT  GAIT: Distance walked: 100 feet Assistive device utilized: None Level of assistance: Complete Independence Comments: WFL   TODAY'S TREATMENT:                                                                                                                              DATE:  09/07/23 Active hamstring stretch 5 x 10 second holds  Supine piriformis stretch 3 x 20 second holds Seated figure 4 stretch 2 x 20 second holds   PATIENT EDUCATION:  Education details: Patient educated on exam findings, POC, scope of PT, HEP, relevant anatomy and biomechanics. Person educated: Patient Education method: Explanation, Demonstration, and Handouts Education comprehension: verbalized understanding, returned demonstration, verbal cues required, and tactile cues required  HOME EXERCISE PROGRAM: Access Code: [email or web address] URL: [email or web address] Date: 09/07/2023 Prepared by: Prentice Graciela Plato  Exercises - Supine Hamstring Stretch  - 2-3 x daily - 7 x weekly - 5-10 reps - 10 second hold - Seated Figure 4 Piriformis Stretch  - 2-3 x daily - 7 x weekly - 3-5 reps - 20 second hold - Supine Piriformis Stretch  - 2-3 x daily - 7 x weekly - 3-5 reps - 10-20 second hold  ASSESSMENT:  CLINICAL IMPRESSION: Patient a 68 y.o. y.o. female who was seen today for physical therapy evaluation and treatment for L hamstring tendonitis/  glute/piriformis pain. Patient presents with pain limited deficits in L hamstring/glute/piriformis strength, ROM, endurance, activity tolerance, and functional mobility with ADL. Patient is having to modify and restrict ADL as indicated by outcome measure score as well as subjective information and objective measures which is affecting overall participation. Patient with  decrease in symptoms following exercises performed with trunk flexion retest. Patient will benefit from skilled physical therapy in order to improve function and reduce impairment.  OBJECTIVE IMPAIRMENTS: decreased activity tolerance, decreased balance, decreased endurance, decreased mobility, difficulty walking, decreased ROM, decreased strength, increased muscle spasms, impaired flexibility, improper body mechanics, and pain  ACTIVITY LIMITATIONS: lifting, bending, sitting, standing, squatting, stairs, transfers, locomotion level, and caring for others  PARTICIPATION LIMITATIONS: meal prep, cleaning, laundry, shopping, community activity, occupation, and yard work  PERSONAL FACTORS: 3+ comorbidities: Hx osteopenia, hx LBP, HLD, R hip pain are also affecting patient's functional outcome.   REHAB POTENTIAL: Good  CLINICAL DECISION MAKING: Evolving/moderate complexity  EVALUATION COMPLEXITY: Moderate   GOALS: Goals reviewed with patient? Yes  SHORT TERM GOALS: Target date: 09/21/2023    Patient will be independent with HEP in order to improve functional outcomes. Baseline: Goal status: INITIAL  2.  Patient will report at least 25% improvement in symptoms for improved quality of life. Baseline: Goal status: INITIAL   LONG TERM GOALS: Target date: 09/28/2023    Patient will report at least 75% improvement in symptoms for improved quality of life. Baseline:  Goal status: INITIAL  2.  Patient will be able to complete trunk flexion with reaching to the floor without LLE symptoms.  Baseline:  Goal status:  INITIAL  3.  Patient will be independent with advanced HEP for continued symptom improvement until R hip surgery.  Baseline:  Goal status: INITIAL    PLAN:  PT FREQUENCY: 1-2x/week  PT DURATION: 3 weeks  PLANNED INTERVENTIONS: 97164- PT Re-evaluation, 97110-Therapeutic exercises, 97530- Therapeutic activity, [id number]- Neuromuscular re-education, 97535- Self Care, [id number]- Manual therapy, [id number]- Gait training, [id number]- Orthotic Fit/training, [phone]- Canalith repositioning, [id number]- Aquatic Therapy, [phone]- Splinting, [phone]- Wound care (first 20 sq cm), 97598- Wound care (each additional 20 sq cm)Patient/Family education, Balance training, Stair training, Taping, Dry Needling, Joint mobilization, Joint manipulation, Spinal manipulation, Spinal mobilization, Scar mobilization, and DME instructions.  PLAN FOR NEXT SESSION: f/u with HEP. Hip mobility, manual for pain/mobility, glute/hamstring strength on L    Prentice GORMAN Stains, PT, DPT 09/07/2023, 11:42 AM

## 2023-09-13 ENCOUNTER — Ambulatory Visit (HOSPITAL_BASED_OUTPATIENT_CLINIC_OR_DEPARTMENT_OTHER): Payer: PRIVATE HEALTH INSURANCE | Admitting: Physical Therapy

## 2023-09-13 ENCOUNTER — Encounter (HOSPITAL_BASED_OUTPATIENT_CLINIC_OR_DEPARTMENT_OTHER): Payer: Self-pay | Admitting: Physical Therapy

## 2023-09-13 DIAGNOSIS — S76302A Unspecified injury of muscle, fascia and tendon of the posterior muscle group at thigh level, left thigh, initial encounter: Secondary | ICD-10-CM

## 2023-09-13 DIAGNOSIS — R2689 Other abnormalities of gait and mobility: Secondary | ICD-10-CM

## 2023-09-13 DIAGNOSIS — M6281 Muscle weakness (generalized): Secondary | ICD-10-CM | POA: Diagnosis not present

## 2023-09-13 DIAGNOSIS — R29898 Other symptoms and signs involving the musculoskeletal system: Secondary | ICD-10-CM

## 2023-09-13 NOTE — Therapy (Signed)
 OUTPATIENT PHYSICAL THERAPY LOWER EXTREMITY EVALUATION   Patient Name: Carolyn Gutierrez MRN: [id number] DOB:1955/11/20, 68 y.o., female Today's Date: 09/13/2023  END OF SESSION:  PT End of Session - 09/13/23 0852     Visit Number 2    Number of Visits 6    Date for PT Re-Evaluation 09/28/23    Authorization Type Medicare    Progress Note Due on Visit 10    PT Start Time [id number]    PT Stop Time 0935    PT Time Calculation (min) 43 min    Activity Tolerance Patient tolerated treatment well    Behavior During Therapy [facility] for tasks assessed/performed          Past Medical History:  Diagnosis Date   Allergy    Anxiety    Aortic atherosclerosis (HCC) 03/30/2021   Asthma    in the past- no inhaler currently   Depression    Elevated blood pressure reading 03/30/2021   Genital warts    Hay fever    Migraine    Pure hypercholesterolemia 03/30/2021   Stress fracture of right foot    Past Surgical History:  Procedure Laterality Date   ABDOMINAL HYSTERECTOMY  01/17/2012   Procedure: HYSTERECTOMY ABDOMINAL;  Surgeon: Rosaline LITTIE Cobble, MD;  Location: [facility];  Service: Gynecology;  Laterality: N/A;   BREAST SURGERY     breast bx   COLONOSCOPY     11 yr ago with dr coleman ? a few polyps, no report   KNEE SURGERY     right   LAPAROSCOPIC ASSISTED VAGINAL HYSTERECTOMY  01/17/2012   Procedure: LAPAROSCOPIC ASSISTED VAGINAL HYSTERECTOMY;  Surgeon: Rosaline LITTIE Cobble, MD;  Location: [facility];  Service: Gynecology;  Laterality: N/A;  attempted    SALPINGOOPHORECTOMY  01/17/2012   Procedure: SALPINGO OOPHORECTOMY;  Surgeon: Rosaline LITTIE Cobble, MD;  Location: [facility];  Service: Gynecology;  Laterality: Right;   Patient Active Problem List   Diagnosis Date Noted   CAD in native artery 03/30/2021   Elevated blood pressure reading 03/30/2021   Pure hypercholesterolemia 03/30/2021   Aortic atherosclerosis (HCC) 03/30/2021   Overweight (BMI 25.0-29.9) 08/20/2017   Vertigo 08/15/2017    Pulmonary nodule 04/30/2017   Acute bilateral low back pain without sciatica 04/30/2017   Plantar fasciitis, right 04/09/2017   Osteopenia 07/07/2015   Headache, migraine 03/30/2015   Delusional disorder (HCC) 11/17/2013    PCP: Burney Darice LITTIE, MD   REFERRING PROVIDER: Genelle Standing, MD  REFERRING DIAG:  left hamstring tendinitis; D23.697J (ICD-10-CM) - Left hamstring injury, initial encounter  THERAPY DIAG:  Muscle weakness (generalized)  Other abnormalities of gait and mobility  Other symptoms and signs involving the musculoskeletal system  Left hamstring injury, initial encounter  Rationale for Evaluation and Treatment: Rehabilitation  ONSET DATE: June 2025  SUBJECTIVE:   SUBJECTIVE STATEMENT: Patient states as a whole, feels like exercises are helping. Notices symptoms with laying in bed and and sitting still.   EVAL Patient states hamstring pain that came on a few weeks ago. Pain in L hamstring/glutes. Notes increased pain with sitting, bending, kicking boxes out of the way. Occasionally pins and needles in the glutes. Has tried ice, heat, etc without relief.   PERTINENT HISTORY: Hx osteopenia, hx LBP, HLD  PAIN:  Are you having pain? Yes: NPRS scale: 4/10 Pain location: L hamstring Pain description: sore Aggravating factors: bending Relieving factors: walking  PRECAUTIONS: None  WEIGHT BEARING RESTRICTIONS: No  FALLS:  Has patient fallen in last  6 months? No    PLOF: Independent  PATIENT GOALS: get rid of hamstring pain.   OBJECTIVE: (objective measures from initial evaluation unless otherwise dated)   PATIENT SURVEYS:  Complete next session  COGNITION: Overall cognitive status: Within functional limits for tasks assessed      POSTURE: No Significant postural limitations  PALPATION: TTP L glute max, piriformis, minimally at L hamstring origin  LOWER EXTREMITY ROM: WFL for tasks assessed, trunk flexion - pain in L glute max  Active  ROM Right eval Left eval  Hip flexion    Hip extension    Hip abduction    Hip adduction    Hip internal rotation    Hip external rotation    Knee flexion    Knee extension    Ankle dorsiflexion    Ankle plantarflexion    Ankle inversion    Ankle eversion     (Blank rows = not tested) *= pain/symptoms  LOWER EXTREMITY MMT:  MMT Right eval Left eval  Hip flexion 5 5  Hip extension 4+ 5  Hip abduction 4+ 5  Hip adduction    Hip internal rotation    Hip external rotation    Knee flexion 5 5  Knee extension 5 5  Ankle dorsiflexion    Ankle plantarflexion    Ankle inversion    Ankle eversion     (Blank rows = not tested) *= pain/symptoms   FUNCTIONAL TESTS:  NT  GAIT: Distance walked: 100 feet Assistive device utilized: None Level of assistance: Complete Independence Comments: WFL   TODAY'S TREATMENT:                                                                                                                              DATE:  09/13/23 Seated figure 4 stretch 2 x 20 second holds Active hamstring stretch 5 x 10 second holds  Supine piriformis stretch 3 x 20 second holds Manual: STM to glutes/piriformis Prone hip extension 2 x 10 Bridge with clam GTB at knees 2 x 10 Tall kneeling hip hinge 2 x 10 Leg press 40# 1 x 10  HS curl machine 40 # 1 x 20 Knee extension machine 40# 1 x 15 Hip abduction machine 70# 1 x 20 Hip adduction machine 70# 1 x 20  09/07/23 Active hamstring stretch 5 x 10 second holds  Supine piriformis stretch 3 x 20 second holds Seated figure 4 stretch 2 x 20 second holds   PATIENT EDUCATION:  Education details: Patient educated on exam findings, POC, scope of PT, HEP, relevant anatomy and biomechanics. 09/13/23: HEP, machine set up Person educated: Patient Education method: Explanation, Demonstration, and Handouts Education comprehension: verbalized understanding, returned demonstration, verbal cues required, and tactile cues  required  HOME EXERCISE PROGRAM: Access Code: [email or web address] URL: [email or web address] Date: 09/07/2023 Prepared by: Prentice Kylie Gros  Exercises - Supine Hamstring Stretch  - 2-3 x daily - 7 x weekly - 5-10 reps - 10 second  hold - Seated Figure 4 Piriformis Stretch  - 2-3 x daily - 7 x weekly - 3-5 reps - 20 second hold - Supine Piriformis Stretch  - 2-3 x daily - 7 x weekly - 3-5 reps - 10-20 second hold  ASSESSMENT:  CLINICAL IMPRESSION: Patient with good mechanics when completing previously completed exercises. STM to tender glutes. Continued with hip strengthening and mobility exercises. Review of gym machines to boost confidence and independence for return to gym routine. Patient will continue to benefit from physical therapy in order to improve function and reduce impairment.   OBJECTIVE IMPAIRMENTS: decreased activity tolerance, decreased balance, decreased endurance, decreased mobility, difficulty walking, decreased ROM, decreased strength, increased muscle spasms, impaired flexibility, improper body mechanics, and pain  ACTIVITY LIMITATIONS: lifting, bending, sitting, standing, squatting, stairs, transfers, locomotion level, and caring for others  PARTICIPATION LIMITATIONS: meal prep, cleaning, laundry, shopping, community activity, occupation, and yard work  PERSONAL FACTORS: 3+ comorbidities: Hx osteopenia, hx LBP, HLD, R hip pain are also affecting patient's functional outcome.   REHAB POTENTIAL: Good  CLINICAL DECISION MAKING: Evolving/moderate complexity  EVALUATION COMPLEXITY: Moderate   GOALS: Goals reviewed with patient? Yes  SHORT TERM GOALS: Target date: 09/21/2023    Patient will be independent with HEP in order to improve functional outcomes. Baseline: Goal status: INITIAL  2.  Patient will report at least 25% improvement in symptoms for improved quality of life. Baseline: Goal status: INITIAL   LONG TERM GOALS: Target date: 09/28/2023     Patient will report at least 75% improvement in symptoms for improved quality of life. Baseline:  Goal status: INITIAL  2.  Patient will be able to complete trunk flexion with reaching to the floor without LLE symptoms.  Baseline:  Goal status: INITIAL  3.  Patient will be independent with advanced HEP for continued symptom improvement until R hip surgery.  Baseline:  Goal status: INITIAL    PLAN:  PT FREQUENCY: 1-2x/week  PT DURATION: 3 weeks  PLANNED INTERVENTIONS: 97164- PT Re-evaluation, 97110-Therapeutic exercises, 97530- Therapeutic activity, [id number]- Neuromuscular re-education, 97535- Self Care, 02859- Manual therapy, [id number]- Gait training, [phone]- Orthotic Fit/training, [phone]- Canalith repositioning, [id number]- Aquatic Therapy, [phone]- Splinting, [phone]- Wound care (first 20 sq cm), 97598- Wound care (each additional 20 sq cm)Patient/Family education, Balance training, Stair training, Taping, Dry Needling, Joint mobilization, Joint manipulation, Spinal manipulation, Spinal mobilization, Scar mobilization, and DME instructions.  PLAN FOR NEXT SESSION: f/u with HEP. Hip mobility, manual for pain/mobility, glute/hamstring strength on L    Prentice GORMAN Stains, PT, DPT 09/13/2023, 9:40 AM

## 2023-09-15 ENCOUNTER — Ambulatory Visit (HOSPITAL_BASED_OUTPATIENT_CLINIC_OR_DEPARTMENT_OTHER): Payer: PRIVATE HEALTH INSURANCE | Admitting: Physical Therapy

## 2023-09-15 ENCOUNTER — Encounter (HOSPITAL_BASED_OUTPATIENT_CLINIC_OR_DEPARTMENT_OTHER): Payer: Self-pay | Admitting: Physical Therapy

## 2023-09-15 DIAGNOSIS — R2689 Other abnormalities of gait and mobility: Secondary | ICD-10-CM

## 2023-09-15 DIAGNOSIS — M6281 Muscle weakness (generalized): Secondary | ICD-10-CM

## 2023-09-15 NOTE — Therapy (Signed)
 OUTPATIENT PHYSICAL THERAPY LOWER EXTREMITY EVALUATION   Patient Name: Carolyn Gutierrez MRN: [id number] DOB:Oct 20, 1955, 68 y.o., female Today's Date: 09/15/2023  END OF SESSION:  PT End of Session - 09/15/23 0851     Visit Number 3    Number of Visits 6    Date for PT Re-Evaluation 09/28/23    Authorization Type Medicare    Progress Note Due on Visit 10    PT Start Time 0828    PT Stop Time 0909    PT Time Calculation (min) 41 min    Activity Tolerance Patient tolerated treatment well    Behavior During Therapy [facility] for tasks assessed/performed           Past Medical History:  Diagnosis Date   Allergy    Anxiety    Aortic atherosclerosis (HCC) 03/30/2021   Asthma    in the past- no inhaler currently   Depression    Elevated blood pressure reading 03/30/2021   Genital warts    Hay fever    Migraine    Pure hypercholesterolemia 03/30/2021   Stress fracture of right foot    Past Surgical History:  Procedure Laterality Date   ABDOMINAL HYSTERECTOMY  01/17/2012   Procedure: HYSTERECTOMY ABDOMINAL;  Surgeon: Rosaline LITTIE Cobble, MD;  Location: [facility];  Service: Gynecology;  Laterality: N/A;   BREAST SURGERY     breast bx   COLONOSCOPY     11 yr ago with dr coleman ? a few polyps, no report   KNEE SURGERY     right   LAPAROSCOPIC ASSISTED VAGINAL HYSTERECTOMY  01/17/2012   Procedure: LAPAROSCOPIC ASSISTED VAGINAL HYSTERECTOMY;  Surgeon: Rosaline LITTIE Cobble, MD;  Location: [facility];  Service: Gynecology;  Laterality: N/A;  attempted    SALPINGOOPHORECTOMY  01/17/2012   Procedure: SALPINGO OOPHORECTOMY;  Surgeon: Rosaline LITTIE Cobble, MD;  Location: [facility];  Service: Gynecology;  Laterality: Right;   Patient Active Problem List   Diagnosis Date Noted   CAD in native artery 03/30/2021   Elevated blood pressure reading 03/30/2021   Pure hypercholesterolemia 03/30/2021   Aortic atherosclerosis (HCC) 03/30/2021   Overweight (BMI 25.0-29.9) 08/20/2017   Vertigo 08/15/2017    Pulmonary nodule 04/30/2017   Acute bilateral low back pain without sciatica 04/30/2017   Plantar fasciitis, right 04/09/2017   Osteopenia 07/07/2015   Headache, migraine 03/30/2015   Delusional disorder (HCC) 11/17/2013    PCP: Burney Darice LITTIE, MD   REFERRING PROVIDER: Genelle Standing, MD  REFERRING DIAG:  left hamstring tendinitis; D23.697J (ICD-10-CM) - Left hamstring injury, initial encounter  THERAPY DIAG:  Muscle weakness (generalized)  Other abnormalities of gait and mobility  Rationale for Evaluation and Treatment: Rehabilitation  ONSET DATE: June 2025  SUBJECTIVE:   SUBJECTIVE STATEMENT: Ready for surgery!  EVAL Patient states hamstring pain that came on a few weeks ago. Pain in L hamstring/glutes. Notes increased pain with sitting, bending, kicking boxes out of the way. Occasionally pins and needles in the glutes. Has tried ice, heat, etc without relief.   PERTINENT HISTORY: Hx osteopenia, hx LBP, HLD  PAIN:  Are you having pain? Yes: NPRS scale: 4/10 Pain location: L hamstring Pain description: sore Aggravating factors: bending Relieving factors: walking  PRECAUTIONS: None  WEIGHT BEARING RESTRICTIONS: No  FALLS:  Has patient fallen in last 6 months? No    PLOF: Independent  PATIENT GOALS: get rid of hamstring pain.   OBJECTIVE: (objective measures from initial evaluation unless otherwise dated)  COGNITION: Overall cognitive status:  Within functional limits for tasks assessed      POSTURE: No Significant postural limitations  PALPATION: TTP L glute max, piriformis, minimally at L hamstring origin  LOWER EXTREMITY ROM: WFL for tasks assessed, trunk flexion - pain in L glute max  Active ROM Right eval Left eval  Hip flexion    Hip extension    Hip abduction    Hip adduction    Hip internal rotation    Hip external rotation    Knee flexion    Knee extension    Ankle dorsiflexion    Ankle plantarflexion    Ankle inversion     Ankle eversion     (Blank rows = not tested) *= pain/symptoms  LOWER EXTREMITY MMT:  MMT Right eval Left eval  Hip flexion 5 5  Hip extension 4+ 5  Hip abduction 4+ 5  Hip adduction    Hip internal rotation    Hip external rotation    Knee flexion 5 5  Knee extension 5 5  Ankle dorsiflexion    Ankle plantarflexion    Ankle inversion    Ankle eversion     (Blank rows = not tested) *= pain/symptoms   FUNCTIONAL TESTS:  NT  GAIT: Distance walked: 100 feet Assistive device utilized: None Level of assistance: Complete Independence Comments: WFL   TODAY'S TREATMENT:                                                                                                                              DATE:  09/15/23 Discussed post op  Roller & STM to Rt hip and lateral thigh Supine piriformis stretch Tall kneel: hinge, bridge, trunk rotation, antirotation press red tband Seated hamstring stretch Qped hip ext- PT blocking lateral movement through pelvis   09/13/23 Seated figure 4 stretch 2 x 20 second holds Active hamstring stretch 5 x 10 second holds  Supine piriformis stretch 3 x 20 second holds Manual: STM to glutes/piriformis Prone hip extension 2 x 10 Bridge with clam GTB at knees 2 x 10 Tall kneeling hip hinge 2 x 10 Leg press 40# 1 x 10  HS curl machine 40 # 1 x 20 Knee extension machine 40# 1 x 15 Hip abduction machine 70# 1 x 20 Hip adduction machine 70# 1 x 20  09/07/23 Active hamstring stretch 5 x 10 second holds  Supine piriformis stretch 3 x 20 second holds Seated figure 4 stretch 2 x 20 second holds   PATIENT EDUCATION:  Education details: Patient educated on exam findings, POC, scope of PT, HEP, relevant anatomy and biomechanics. 09/13/23: HEP, machine set up Person educated: Patient Education method: Explanation, Demonstration, and Handouts Education comprehension: verbalized understanding, returned demonstration, verbal cues required, and tactile cues  required  HOME EXERCISE PROGRAM: Access Code: [email or web address] URL: [email or web address] Date: 09/07/2023 Prepared by: Prentice Zaunegger  Exercises - Supine Hamstring Stretch  - 2-3 x daily - 7 x weekly - 5-10 reps - 10  second hold - Seated Figure 4 Piriformis Stretch  - 2-3 x daily - 7 x weekly - 3-5 reps - 20 second hold - Supine Piriformis Stretch  - 2-3 x daily - 7 x weekly - 3-5 reps - 10-20 second hold  ASSESSMENT:  CLINICAL IMPRESSION: Time taken today to discuss expectations for post op labral repair. Exercises focused on core/glut activation while limiting rotation through FA joint.   OBJECTIVE IMPAIRMENTS: decreased activity tolerance, decreased balance, decreased endurance, decreased mobility, difficulty walking, decreased ROM, decreased strength, increased muscle spasms, impaired flexibility, improper body mechanics, and pain  ACTIVITY LIMITATIONS: lifting, bending, sitting, standing, squatting, stairs, transfers, locomotion level, and caring for others  PARTICIPATION LIMITATIONS: meal prep, cleaning, laundry, shopping, community activity, occupation, and yard work  PERSONAL FACTORS: 3+ comorbidities: Hx osteopenia, hx LBP, HLD, R hip pain are also affecting patient's functional outcome.   REHAB POTENTIAL: Good  CLINICAL DECISION MAKING: Evolving/moderate complexity  EVALUATION COMPLEXITY: Moderate   GOALS: Goals reviewed with patient? Yes  SHORT TERM GOALS: Target date: 09/21/2023    Patient will be independent with HEP in order to improve functional outcomes. Baseline: Goal status:MET  2.  Patient will report at least 25% improvement in symptoms for improved quality of life. Baseline: Goal status:MET   LONG TERM GOALS: Target date: 09/28/2023    Patient will report at least 75% improvement in symptoms for improved quality of life. Baseline:  Goal status: INITIAL  2.  Patient will be able to complete trunk flexion with reaching to the floor  without LLE symptoms.  Baseline:  Goal status: INITIAL  3.  Patient will be independent with advanced HEP for continued symptom improvement until R hip surgery.  Baseline:  Goal status: INITIAL    PLAN:  PT FREQUENCY: 1-2x/week  PT DURATION: 3 weeks  PLANNED INTERVENTIONS: 97164- PT Re-evaluation, 97110-Therapeutic exercises, 97530- Therapeutic activity, [id number]- Neuromuscular re-education, 97535- Self Care, 02859- Manual therapy, [id number]- Gait training, [phone]- Orthotic Fit/training, [phone]- Canalith repositioning, [id number]- Aquatic Therapy, [phone]- Splinting, [phone]- Wound care (first 20 sq cm), 97598- Wound care (each additional 20 sq cm)Patient/Family education, Balance training, Stair training, Taping, Dry Needling, Joint mobilization, Joint manipulation, Spinal manipulation, Spinal mobilization, Scar mobilization, and DME instructions.  PLAN FOR NEXT SESSION: f/u with HEP. Hip mobility, manual for pain/mobility, glute/hamstring strength on L    Harlene Cordon, PT, DPT 09/15/2023, 9:11 AM

## 2023-09-25 ENCOUNTER — Encounter (HOSPITAL_BASED_OUTPATIENT_CLINIC_OR_DEPARTMENT_OTHER): Payer: Self-pay | Admitting: Physical Therapy

## 2023-09-25 ENCOUNTER — Ambulatory Visit (HOSPITAL_BASED_OUTPATIENT_CLINIC_OR_DEPARTMENT_OTHER): Payer: PRIVATE HEALTH INSURANCE | Admitting: Physical Therapy

## 2023-09-25 DIAGNOSIS — M6281 Muscle weakness (generalized): Secondary | ICD-10-CM | POA: Diagnosis not present

## 2023-09-25 DIAGNOSIS — R29898 Other symptoms and signs involving the musculoskeletal system: Secondary | ICD-10-CM

## 2023-09-25 DIAGNOSIS — R2689 Other abnormalities of gait and mobility: Secondary | ICD-10-CM

## 2023-09-25 NOTE — Therapy (Signed)
 OUTPATIENT PHYSICAL THERAPY LOWER EXTREMITY TREATMENT    Patient Name: Carolyn Gutierrez MRN: [id number] DOB:Aug 06, 1955, 68 y.o., female Today's Date: 09/25/2023  END OF SESSION:  PT End of Session - 09/25/23 1507     Visit Number 4    Number of Visits 6    Date for PT Re-Evaluation 09/28/23    Authorization Type Medicare    Progress Note Due on Visit 10    PT Start Time 1433    PT Stop Time 1512    PT Time Calculation (min) 39 min    Activity Tolerance Patient tolerated treatment well    Behavior During Therapy [facility] for tasks assessed/performed            Past Medical History:  Diagnosis Date   Allergy    Anxiety    Aortic atherosclerosis (HCC) 03/30/2021   Asthma    in the past- no inhaler currently   Depression    Elevated blood pressure reading 03/30/2021   Genital warts    Hay fever    Migraine    Pure hypercholesterolemia 03/30/2021   Stress fracture of right foot    Past Surgical History:  Procedure Laterality Date   ABDOMINAL HYSTERECTOMY  01/17/2012   Procedure: HYSTERECTOMY ABDOMINAL;  Surgeon: Rosaline LITTIE Cobble, MD;  Location: [facility];  Service: Gynecology;  Laterality: N/A;   BREAST SURGERY     breast bx   COLONOSCOPY     11 yr ago with dr coleman ? a few polyps, no report   KNEE SURGERY     right   LAPAROSCOPIC ASSISTED VAGINAL HYSTERECTOMY  01/17/2012   Procedure: LAPAROSCOPIC ASSISTED VAGINAL HYSTERECTOMY;  Surgeon: Rosaline LITTIE Cobble, MD;  Location: [facility];  Service: Gynecology;  Laterality: N/A;  attempted    SALPINGOOPHORECTOMY  01/17/2012   Procedure: SALPINGO OOPHORECTOMY;  Surgeon: Rosaline LITTIE Cobble, MD;  Location: [facility];  Service: Gynecology;  Laterality: Right;   Patient Active Problem List   Diagnosis Date Noted   CAD in native artery 03/30/2021   Elevated blood pressure reading 03/30/2021   Pure hypercholesterolemia 03/30/2021   Aortic atherosclerosis (HCC) 03/30/2021   Overweight (BMI 25.0-29.9) 08/20/2017   Vertigo 08/15/2017    Pulmonary nodule 04/30/2017   Acute bilateral low back pain without sciatica 04/30/2017   Plantar fasciitis, right 04/09/2017   Osteopenia 07/07/2015   Headache, migraine 03/30/2015   Delusional disorder (HCC) 11/17/2013    PCP: Burney Darice LITTIE, MD   REFERRING PROVIDER: Genelle Standing, MD  REFERRING DIAG:  left hamstring tendinitis; D23.697J (ICD-10-CM) - Left hamstring injury, initial encounter  THERAPY DIAG:  Muscle weakness (generalized)  Other abnormalities of gait and mobility  Other symptoms and signs involving the musculoskeletal system  Rationale for Evaluation and Treatment: Rehabilitation  ONSET DATE: June 2025  SUBJECTIVE:   SUBJECTIVE STATEMENT:   Have been having pain in my left hip (points to SIJ area), having the labrum repair next Thursday (can't wait!).   EVAL Patient states hamstring pain that came on a few weeks ago. Pain in L hamstring/glutes. Notes increased pain with sitting, bending, kicking boxes out of the way. Occasionally pins and needles in the glutes. Has tried ice, heat, etc without relief.   PERTINENT HISTORY: Hx osteopenia, hx LBP, HLD  PAIN:  Are you having pain? Yes: NPRS scale: 2/10 Pain location: L glute/SIJ Pain description: sore Aggravating factors: bending Relieving factors: walking  PRECAUTIONS: None  WEIGHT BEARING RESTRICTIONS: No  FALLS:  Has patient fallen in last 6  months? No    PLOF: Independent  PATIENT GOALS: get rid of hamstring pain.   OBJECTIVE: (objective measures from initial evaluation unless otherwise dated)  COGNITION: Overall cognitive status: Within functional limits for tasks assessed      POSTURE: No Significant postural limitations  PALPATION: TTP L glute max, piriformis, minimally at L hamstring origin   OBSERVATIONS   L LE significantly longer than R, sx and area of pain consistent with SIJ involvement   LOWER EXTREMITY ROM: WFL for tasks assessed, trunk flexion - pain in L glute  max  Active ROM Right eval Left eval  Hip flexion    Hip extension    Hip abduction    Hip adduction    Hip internal rotation    Hip external rotation    Knee flexion    Knee extension    Ankle dorsiflexion    Ankle plantarflexion    Ankle inversion    Ankle eversion     (Blank rows = not tested) *= pain/symptoms  LOWER EXTREMITY MMT:  MMT Right eval Left eval Right 09/25/23 Left 09/25/23  Hip flexion 5 5    Hip extension 4+ 5 4+ 5  Hip abduction 4+ 5 5 5   Hip adduction      Hip internal rotation      Hip external rotation      Knee flexion 5 5    Knee extension 5 5    Ankle dorsiflexion      Ankle plantarflexion      Ankle inversion      Ankle eversion       (Blank rows = not tested) *= pain/symptoms   FUNCTIONAL TESTS:  NT  GAIT: Distance walked: 100 feet Assistive device utilized: None Level of assistance: Complete Independence Comments: WFL   TODAY'S TREATMENT:                                                                                                                              DATE:   09/25/23  MMT, SIJ check,  goals Nustep L5x6 minutes BLEs only seat 8  SIJ correction- self MET  Bridges with green TB around knees x12  Sidelying hip ABD green TB x12  Walking bridge x12  Hip hikes x12 Hip hikes + ABD x12 Hip hikes + swing x12 B Supine piriformis stretch 2x30 seconds B Supine HS stretches 2x30 seconds B       09/15/23 Discussed post op  Roller & STM to Rt hip and lateral thigh Supine piriformis stretch Tall kneel: hinge, bridge, trunk rotation, antirotation press red tband Seated hamstring stretch Qped hip ext- PT blocking lateral movement through pelvis   09/13/23 Seated figure 4 stretch 2 x 20 second holds Active hamstring stretch 5 x 10 second holds  Supine piriformis stretch 3 x 20 second holds Manual: STM to glutes/piriformis Prone hip extension 2 x 10 Bridge with clam GTB at knees 2 x 10 Tall kneeling hip  hinge 2 x  10 Leg press 40# 1 x 10  HS curl machine 40 # 1 x 20 Knee extension machine 40# 1 x 15 Hip abduction machine 70# 1 x 20 Hip adduction machine 70# 1 x 20  09/07/23 Active hamstring stretch 5 x 10 second holds  Supine piriformis stretch 3 x 20 second holds Seated figure 4 stretch 2 x 20 second holds   PATIENT EDUCATION:  Education details: Patient educated on exam findings, POC, scope of PT, HEP, relevant anatomy and biomechanics. 09/13/23: HEP, machine set up Person educated: Patient Education method: Explanation, Demonstration, and Handouts Education comprehension: verbalized understanding, returned demonstration, verbal cues required, and tactile cues required  HOME EXERCISE PROGRAM: Access Code: [email or web address] URL: [email or web address] Date: 09/07/2023 Prepared by: Prentice Zaunegger  Exercises - Supine Hamstring Stretch  - 2-3 x daily - 7 x weekly - 5-10 reps - 10 second hold - Seated Figure 4 Piriformis Stretch  - 2-3 x daily - 7 x weekly - 3-5 reps - 20 second hold - Supine Piriformis Stretch  - 2-3 x daily - 7 x weekly - 3-5 reps - 10-20 second hold  ASSESSMENT:  CLINICAL IMPRESSION:   Updated MMT and checked/addressed SIJ today- does have sx and signs consistent with SI involvement and really responded well to SI MET. Having her surgery on 8/28, she's not sure if she will keep her Friday PT appointment prior to- will let us  know. Focused primarily on strengthening today per her request. Expect that surgeon will send us  an updated PT referral once she is ready to start PT after her surgery.   OBJECTIVE IMPAIRMENTS: decreased activity tolerance, decreased balance, decreased endurance, decreased mobility, difficulty walking, decreased ROM, decreased strength, increased muscle spasms, impaired flexibility, improper body mechanics, and pain  ACTIVITY LIMITATIONS: lifting, bending, sitting, standing, squatting, stairs, transfers, locomotion level, and caring for  others  PARTICIPATION LIMITATIONS: meal prep, cleaning, laundry, shopping, community activity, occupation, and yard work  PERSONAL FACTORS: 3+ comorbidities: Hx osteopenia, hx LBP, HLD, R hip pain are also affecting patient's functional outcome.   REHAB POTENTIAL: Good  CLINICAL DECISION MAKING: Evolving/moderate complexity  EVALUATION COMPLEXITY: Moderate   GOALS: Goals reviewed with patient? Yes  SHORT TERM GOALS: Target date: 09/21/2023    Patient will be independent with HEP in order to improve functional outcomes. Baseline: Goal status:MET  2.  Patient will report at least 25% improvement in symptoms for improved quality of life. Baseline: Goal status:MET   LONG TERM GOALS: Target date: 09/28/2023    Patient will report at least 75% improvement in symptoms for improved quality of life. Baseline:  Goal status: IN PROGRESS 09/25/23  25-50% improvement   2.  Patient will be able to complete trunk flexion with reaching to the floor without LLE symptoms.  Baseline:  Goal status: MET 09/25/23  3.  Patient will be independent with advanced HEP for continued symptom improvement until R hip surgery.  Baseline:  Goal status: IN PROGRESS 09/25/23    PLAN:  PT FREQUENCY: 1-2x/week  PT DURATION: 3 weeks  PLANNED INTERVENTIONS: 97164- PT Re-evaluation, 97110-Therapeutic exercises, 97530- Therapeutic activity, 97112- Neuromuscular re-education, 97535- Self Care, 02859- Manual therapy, [phone]- Gait training, [phone]- Orthotic Fit/training, [phone]- Canalith repositioning, [id number]- Aquatic Therapy, [phone]- Splinting, [phone]- Wound care (first 20 sq cm), 97598- Wound care (each additional 20 sq cm)Patient/Family education, Balance training, Stair training, Taping, Dry Needling, Joint mobilization, Joint manipulation, Spinal manipulation, Spinal mobilization, Scar mobilization, and DME instructions.  PLAN FOR NEXT SESSION: f/u with  HEP. Hip mobility, manual for pain/mobility, glute/hamstring  strength on L. Complete any pre-op education and HEP updates prior to surgery 8/28  Josette Rough, PT, DPT 09/25/23 3:14 PM

## 2023-09-28 ENCOUNTER — Ambulatory Visit (HOSPITAL_BASED_OUTPATIENT_CLINIC_OR_DEPARTMENT_OTHER): Payer: PRIVATE HEALTH INSURANCE | Admitting: Physical Therapy

## 2023-10-04 ENCOUNTER — Telehealth (HOSPITAL_BASED_OUTPATIENT_CLINIC_OR_DEPARTMENT_OTHER): Payer: Self-pay | Admitting: Orthopaedic Surgery

## 2023-10-04 ENCOUNTER — Encounter (HOSPITAL_BASED_OUTPATIENT_CLINIC_OR_DEPARTMENT_OTHER): Payer: Self-pay | Admitting: Orthopaedic Surgery

## 2023-10-04 ENCOUNTER — Other Ambulatory Visit (HOSPITAL_BASED_OUTPATIENT_CLINIC_OR_DEPARTMENT_OTHER): Payer: Self-pay | Admitting: Physician Assistant

## 2023-10-04 DIAGNOSIS — S76301A Unspecified injury of muscle, fascia and tendon of the posterior muscle group at thigh level, right thigh, initial encounter: Secondary | ICD-10-CM | POA: Diagnosis not present

## 2023-10-04 MED ORDER — IBUPROFEN 800 MG PO TABS: 800.0000 mg | ORAL_TABLET | Freq: Three times a day (TID) | ORAL | 0 refills | Status: AC | PRN

## 2023-10-04 MED ORDER — OXYCODONE HCL 5 MG PO CAPS: 5.0000 mg | ORAL_CAPSULE | ORAL | 0 refills | Status: AC | PRN

## 2023-10-04 MED ORDER — ACETAMINOPHEN 500 MG PO TABS
500.0000 mg | ORAL_TABLET | Freq: Four times a day (QID) | ORAL | 0 refills | Status: AC | PRN
Start: 2023-10-04 — End: ?

## 2023-10-04 MED ORDER — ASPIRIN 325 MG PO TBEC: 325.0000 mg | DELAYED_RELEASE_TABLET | Freq: Every day | ORAL | 0 refills | Status: AC

## 2023-10-04 NOTE — Progress Notes (Signed)
 Date of Surgery: 10/04/2023  INDICATIONS: Ms. Reinhold is a 68 y.o.-year-old female with right hip labral tear.  The risk and benefits of the procedure were discussed in detail and documented in the pre-operative evaluation.   PREOPERATIVE DIAGNOSIS: 1. Right hip labral tear  POSTOPERATIVE DIAGNOSIS: Same.  PROCEDURE: 1. Right hip labral repair  SURGEON: Elspeth LITTIE Parker MD  ASSISTANT: Conley Dawson, ATC  ANESTHESIA:  general  IV FLUIDS AND URINE: See anesthesia record.  ANTIBIOTICS: Ancef   ESTIMATED BLOOD LOSS: 10 mL.  IMPLANTS:  * No surgical log found *  DRAINS: None  CULTURES: None  COMPLICATIONS: none  DESCRIPTION OF PROCEDURE:  Cartilage Intact femoral and acetabular cartilage   Labrum normplastic appearing with rug sign   Boundaries of labral tear Convention (3 o'clock anterior, 9 o'clock posterior) Anterior boundary: 3 o'clock Posterior boundary: 1 o'clock   OPERATIVE REPORT:  The patient was brought to the operating room, placed supine on the operating table, and bony prominences were padded.  The traction boots were applied with padding to ensure that safe traction could be applied through the feet.  The contralateral limb was abducted maximally and light traction was applied.  The operative leg was brought into neutral position.  The flouroscopic c-arm was brought between the legs for an AP image.  The patient was prepped and draped in a sterile fashion.  Time-out was performed and landmarks were identified. Traction was obtained and care was taken to ensure the least amount of force necessary to allow safe access to the joint of 8-11mm.  This was checked with fluoroscopy.    Next we placed an anterolateral portal under the assistance of fluoroscopy.  First, fluoroscopy was used to estimate the trajectory and starting point.  A 5mm incision with a #11 blade was made and a straight hemostat was used to dilate the portal through the appropriate tract.  We then  placed a 14-gauge hypodermic needle with careful technique to be as close to the femoral head as possible and parallel to the sorcele to ensure no iatrogenic damage to the labrum.  This released the negative pressure environment and the amount of traction was adjusted to maintain the 8-95mm of distraction.  A nitinol wire was placed through the needle and flouroscopy was used to ensure it extended to the medial wall of the acetabulum.  The Flowport from [employer] was placed over the wire and the nitinol wire was retracted to just inside the capsule during insertion of the dilator and cannula to minimize the risk of breakage. The arthroscope was placed next and we visualized the anterior triangle.     We then placed the anterior portal under direct visualization using the technique described above.  This was safely placed as well without damage to the labrum or femoral head.  We then switched our arthroscope to the anterior portal to ensure we were not through the labrum - we were safely through the capsule only.  We then proceeded with periportal capsulotomies utilizing the Samurai blade in each portal without connecting the two.  We identified the anterior inferior iliac spine proximally, the psoas tendon medially and the rectus tendon laterally as landmarks.  We then proceeded with a diagnostic arthroscopy - the results can be found in the findings section above.    We then used the radiofrequency device to clear the superior acetabulum and expose the subspinous region.  Next we exposed the acetabular rim leaving the chondral labral junction intact.  When adequate reshaping was obtained we then proceeded with the labral repair. We placed 2 anchors at the 1:00 and 2:30 positions. The sutures were passed using the crescent [employer]op[employer] from [employer].  This resulted in anatomic labral repair.  We debrided the loose cartilage at the rim and residual degenerative labral tissue.  Traction was let down  with total traction time of 24 minutes.     Finally, we performed a complete capsular closure with tape suture.  She was replaced in the anterior and posterior limb of the reported capsulotomy with excellent apposition. We then removed the arthroscope and closed the incisions with 3-0 nylon simple stitches.  A sterile dressing was applied..  The patient was awakened from anesthesia and transferred to PACU in stable condition. Postoperative care includes:       POSTOPERATIVE PLAN:    Weight bearing as tolerated operative extremity Formal physical therapy will begin immediately within the first weeks of surgery ASA 325 Daily for DVT prophylaxis     Elspeth LITTIE Parker, MD 1:20 PM

## 2023-10-04 NOTE — Telephone Encounter (Signed)
 Post op meds

## 2023-10-09 ENCOUNTER — Telehealth (HOSPITAL_BASED_OUTPATIENT_CLINIC_OR_DEPARTMENT_OTHER): Payer: Self-pay | Admitting: Orthopaedic Surgery

## 2023-10-09 ENCOUNTER — Ambulatory Visit (HOSPITAL_BASED_OUTPATIENT_CLINIC_OR_DEPARTMENT_OTHER): Payer: PRIVATE HEALTH INSURANCE | Admitting: Physical Therapy

## 2023-10-09 NOTE — Telephone Encounter (Signed)
 Returned call to patient and answered questions regarding dressings and restrictions. No further questions asked

## 2023-10-09 NOTE — Telephone Encounter (Signed)
 Patient has questions about her surgery since her PT was cx. She wants to know if she can change bandage, weight restrictions, should she be walking etc?

## 2023-10-11 ENCOUNTER — Ambulatory Visit (HOSPITAL_BASED_OUTPATIENT_CLINIC_OR_DEPARTMENT_OTHER): Payer: PRIVATE HEALTH INSURANCE | Attending: Orthopedic Surgery | Admitting: Physical Therapy

## 2023-10-11 ENCOUNTER — Other Ambulatory Visit (HOSPITAL_BASED_OUTPATIENT_CLINIC_OR_DEPARTMENT_OTHER): Payer: Self-pay | Admitting: Orthopaedic Surgery

## 2023-10-11 DIAGNOSIS — R2689 Other abnormalities of gait and mobility: Secondary | ICD-10-CM | POA: Diagnosis present

## 2023-10-11 DIAGNOSIS — M25551 Pain in right hip: Secondary | ICD-10-CM | POA: Diagnosis present

## 2023-10-11 DIAGNOSIS — S76302A Unspecified injury of muscle, fascia and tendon of the posterior muscle group at thigh level, left thigh, initial encounter: Secondary | ICD-10-CM | POA: Insufficient documentation

## 2023-10-11 DIAGNOSIS — R29898 Other symptoms and signs involving the musculoskeletal system: Secondary | ICD-10-CM | POA: Diagnosis present

## 2023-10-11 DIAGNOSIS — M6281 Muscle weakness (generalized): Secondary | ICD-10-CM | POA: Insufficient documentation

## 2023-10-11 NOTE — Therapy (Signed)
 OUTPATIENT PHYSICAL THERAPY LOWER EXTREMITY TREATMENT    Patient Name: Carolyn Gutierrez MRN: [id number] DOB:12-Nov-1955, 68 y.o., female Today's Date: 10/11/2023  END OF SESSION:  PT End of Session - 10/11/23 1208     Visit Number 1    Number of Visits 15    Date for PT Re-Evaluation 01/05/24    Authorization Type Medicare    Progress Note Due on Visit 6    PT Start Time 1145    PT Stop Time 1208    PT Time Calculation (min) 23 min    Activity Tolerance Patient tolerated treatment well    Behavior During Therapy [facility] for tasks assessed/performed             Past Medical History:  Diagnosis Date   Allergy    Anxiety    Aortic atherosclerosis (HCC) 03/30/2021   Asthma    in the past- no inhaler currently   Depression    Elevated blood pressure reading 03/30/2021   Genital warts    Hay fever    Migraine    Pure hypercholesterolemia 03/30/2021   Stress fracture of right foot    Past Surgical History:  Procedure Laterality Date   ABDOMINAL HYSTERECTOMY  01/17/2012   Procedure: HYSTERECTOMY ABDOMINAL;  Surgeon: Rosaline LITTIE Cobble, MD;  Location: [facility];  Service: Gynecology;  Laterality: N/A;   BREAST SURGERY     breast bx   COLONOSCOPY     11 yr ago with dr coleman ? a few polyps, no report   KNEE SURGERY     right   LAPAROSCOPIC ASSISTED VAGINAL HYSTERECTOMY  01/17/2012   Procedure: LAPAROSCOPIC ASSISTED VAGINAL HYSTERECTOMY;  Surgeon: Rosaline LITTIE Cobble, MD;  Location: [facility];  Service: Gynecology;  Laterality: N/A;  attempted    SALPINGOOPHORECTOMY  01/17/2012   Procedure: SALPINGO OOPHORECTOMY;  Surgeon: Rosaline LITTIE Cobble, MD;  Location: [facility];  Service: Gynecology;  Laterality: Right;   Patient Active Problem List   Diagnosis Date Noted   CAD in native artery 03/30/2021   Elevated blood pressure reading 03/30/2021   Pure hypercholesterolemia 03/30/2021   Aortic atherosclerosis (HCC) 03/30/2021   Overweight (BMI 25.0-29.9) 08/20/2017   Vertigo 08/15/2017    Pulmonary nodule 04/30/2017   Acute bilateral low back pain without sciatica 04/30/2017   Plantar fasciitis, right 04/09/2017   Osteopenia 07/07/2015   Headache, migraine 03/30/2015   Delusional disorder (HCC) 11/17/2013    PCP: Burney Darice LITTIE, MD   REFERRING PROVIDER: Genelle Standing, MD  REFERRING DIAG:  left hamstring tendinitis; D23.697J (ICD-10-CM) - Left hamstring injury, initial encounter  M25.551 (ICD-10-CM) - Pain of right hip     S/p Rt hip labral repair  THERAPY DIAG:  Pain of right hip  Rationale for Evaluation and Treatment: Rehabilitation  ONSET DATE: June 2025 DOS 10/04/23  SUBJECTIVE:   SUBJECTIVE STATEMENT:  10/11/23- reevaluation following surgical intervention.  Tried wearing shoes and the slight lift in the heel made the leg tight.    PERTINENT HISTORY: Hx osteopenia, hx LBP, HLD  PAIN:  Are you having pain? Yes: NPRS scale: 2/10 Pain location: L glute/SIJ Pain description: sore Aggravating factors: bending Relieving factors: walking  PRECAUTIONS: None  WEIGHT BEARING RESTRICTIONS: No, WBAT post op  FALLS:  Has patient fallen in last 6 months? No    PLOF: Independent  PATIENT GOALS: get rid of hamstring pain.   OBJECTIVE: (objective measures from initial evaluation unless otherwise dated)   Extreme difficulty/unable (0), Quite a bit of difficulty (  1), Moderate difficulty (2), Little difficulty (3), No difficulty (4) Survey date:    Any of your usual work, housework or school activities 1  2. Usual hobbies, recreational or sporting activities 0  3. Getting into/out of the bath 1  4. Walking between rooms 3  5. Putting on socks/shoes 1  6. Squatting  0  7. Lifting an object, like a bag of groceries from the floor 0  8. Performing light activities around your home 1  9. Performing heavy activities around your home 0  10. Getting into/out of a car 1  11. Walking 2 blocks 0  12. Walking 1 mile 0  13. Going up/down 10 stairs (1  flight) 0  14. Standing for 1 hour 0  15.  sitting for 1 hour 1  16. Running on even ground 0  17. Running on uneven ground 0  18. Making sharp turns while running fast 0  19. Hopping  0  20. Rolling over in bed 2  Score total:  11/80    POSTURE: No Significant postural limitations  PALPATION: TTP L glute max, piriformis, minimally at L hamstring origin   OBSERVATIONS   L LE significantly longer than R, sx and area of pain consistent with SIJ involvement   LOWER EXTREMITY ROM:   Passive ROM Right 9/4   Hip flexion    Hip extension    Hip abduction    Hip adduction    Hip internal rotation    Hip external rotation    Knee flexion    Knee extension    Ankle dorsiflexion    Ankle plantarflexion    Ankle inversion    Ankle eversion     (Blank rows = not tested) *= pain/symptoms  LOWER EXTREMITY MMT:  MMT Right eval Left eval Right 09/25/23 Left 09/25/23  Hip flexion 5 5    Hip extension 4+ 5 4+ 5  Hip abduction 4+ 5 5 5   Hip adduction      Hip internal rotation      Hip external rotation      Knee flexion 5 5    Knee extension 5 5    Ankle dorsiflexion      Ankle plantarflexion      Ankle inversion      Ankle eversion       (Blank rows = not tested) *= pain/symptoms   GAIT:  9/4 Comments:    TODAY'S TREATMENT:                                                                                                                              DATE:   10/11/23 Changed bandages See HEP Gait training to reduce anterior motion of hip ahead of trunk    PATIENT EDUCATION:  Education details: Anatomy of condition, POC, HEP, exercise form/rationale  Person educated: Patient Education method: Explanation, Demonstration, and Handouts Education comprehension: verbalized understanding, returned demonstration, verbal cues required, and tactile cues  required  HOME EXERCISE PROGRAM: Prehab- Access Code: [email or web address] URL: [email or web address]  Access  Code: [email or web address] URL: [email or web address] Date: 10/11/2023 Prepared by: Harlene Cordon  Exercises - Lying Prone  - 3-4 x daily - 7 x weekly - 2-3 min hold - Prone Gluteal Sets  - 3-4 x daily - 7 x weekly - 1 sets - 10 reps - 3s hold - Prone Knee Flexion  - 3-4 x daily - 7 x weekly - 1 sets - 10 reps - Seated Hamstring Stretch  - 2-3 x daily - 7 x weekly - 2 sets - 5 breaths hold - Standing Heel Raise  - 1 x daily - 7 x weekly - 2-3 sets - 1 reps  ASSESSMENT:  CLINICAL IMPRESSION:   Pt is doing very well s/p Rt hip labral repair. Will continue to benefit from skilled PT to progress appropriately post op.   OBJECTIVE IMPAIRMENTS: decreased activity tolerance, decreased balance, decreased endurance, decreased mobility, difficulty walking, decreased ROM, decreased strength, increased muscle spasms, impaired flexibility, improper body mechanics, and pain  ACTIVITY LIMITATIONS: lifting, bending, sitting, standing, squatting, stairs, transfers, locomotion level, and caring for others  PARTICIPATION LIMITATIONS: meal prep, cleaning, laundry, shopping, community activity, occupation, and yard work  PERSONAL FACTORS: 3+ comorbidities: Hx osteopenia, hx LBP, HLD, R hip pain are also affecting patient's functional outcome.   REHAB POTENTIAL: Good  CLINICAL DECISION MAKING: Evolving/moderate complexity  EVALUATION COMPLEXITY: Moderate   GOALS: Goals reviewed with patient? Yes  SHORT TERM GOALS: Target date: 4 weeks 9/27  Pain free flexion to 90 Baseline: Goal status: INITIAL   2.  Demo controlled quad set with hold Baseline:  Goal status: INITIAL   3.  30s sit to stand without pain or compensation Baseline:  Goal status: INITIAL   4.  SLS 30s without compensation Baseline:  Goal status: INITIAL    LONG TERM GOALS:  Able to demo step up on 6 step with level pelvis and proper form Baseline:  Goal status: INITIAL Week 8 10/25  2.  Will tolerate at least  3 min on elliptical, demonstrating good tolerance to repetitive weight bearing motion Baseline:  Goal status: INITIAL  Week 8 10/25  3.  Demonstrate proper form in at least 10 continuous lunges without increased pain Baseline:  Goal status: INITIAL  Week 12 11/22  4.  Demonstrate ability to get up and down from the floor without limitation by hip pain Baseline:  Goal status: INITIAL Week 12 11/22  PLAN:  PT FREQUENCY: 1-2x/week  PT DURATION: 3 weeks  PLANNED INTERVENTIONS: 97164- PT Re-evaluation, 97110-Therapeutic exercises, 97530- Therapeutic activity, 97112- Neuromuscular re-education, 97535- Self Care, 02859- Manual therapy, [id number]- Gait training, [phone]- Orthotic Fit/training, [phone]- Canalith repositioning, [id number]- Aquatic Therapy, 97760- Splinting, [phone]- Wound care (first 20 sq cm), 97598- Wound care (each additional 20 sq cm)Patient/Family education, Balance training, Stair training, Taping, Dry Needling, Joint mobilization, Joint manipulation, Spinal manipulation, Spinal mobilization, Scar mobilization, and DME instructions.  PLAN FOR NEXT SESSION: f/u with HEP. Hip mobility, manual for pain/mobility, glute/hamstring strength on L. Complete any pre-op education and HEP updates prior to surgery 8/28  Emarie Paul C. Johnathyn Viscomi PT, DPT 10/11/23 12:24 PM

## 2023-10-18 ENCOUNTER — Encounter (HOSPITAL_BASED_OUTPATIENT_CLINIC_OR_DEPARTMENT_OTHER): Payer: Self-pay | Admitting: Physical Therapy

## 2023-10-18 ENCOUNTER — Encounter (HOSPITAL_BASED_OUTPATIENT_CLINIC_OR_DEPARTMENT_OTHER): Admitting: Orthopaedic Surgery

## 2023-10-18 ENCOUNTER — Ambulatory Visit (HOSPITAL_BASED_OUTPATIENT_CLINIC_OR_DEPARTMENT_OTHER): Payer: PRIVATE HEALTH INSURANCE | Admitting: Physical Therapy

## 2023-10-18 DIAGNOSIS — M25551 Pain in right hip: Secondary | ICD-10-CM | POA: Diagnosis not present

## 2023-10-18 DIAGNOSIS — R2689 Other abnormalities of gait and mobility: Secondary | ICD-10-CM

## 2023-10-18 DIAGNOSIS — R29898 Other symptoms and signs involving the musculoskeletal system: Secondary | ICD-10-CM

## 2023-10-18 DIAGNOSIS — S76302A Unspecified injury of muscle, fascia and tendon of the posterior muscle group at thigh level, left thigh, initial encounter: Secondary | ICD-10-CM

## 2023-10-18 DIAGNOSIS — M6281 Muscle weakness (generalized): Secondary | ICD-10-CM

## 2023-10-18 NOTE — Therapy (Signed)
 OUTPATIENT PHYSICAL THERAPY LOWER EXTREMITY TREATMENT    Patient Name: Carolyn Gutierrez MRN: [id number] DOB:August 03, 1955, 68 y.o., female Today's Date: 10/18/2023  END OF SESSION:  PT End of Session - 10/18/23 1406     Visit Number 2    Number of Visits 15    Date for PT Re-Evaluation 01/05/24    Authorization Type Medicare    Progress Note Due on Visit 6    PT Start Time 1405    PT Stop Time 1445    PT Time Calculation (min) 40 min    Activity Tolerance Patient tolerated treatment well    Behavior During Therapy [facility] for tasks assessed/performed             Past Medical History:  Diagnosis Date   Allergy    Anxiety    Aortic atherosclerosis (HCC) 03/30/2021   Asthma    in the past- no inhaler currently   Depression    Elevated blood pressure reading 03/30/2021   Genital warts    Hay fever    Migraine    Pure hypercholesterolemia 03/30/2021   Stress fracture of right foot    Past Surgical History:  Procedure Laterality Date   ABDOMINAL HYSTERECTOMY  01/17/2012   Procedure: HYSTERECTOMY ABDOMINAL;  Surgeon: Rosaline LITTIE Cobble, MD;  Location: [facility];  Service: Gynecology;  Laterality: N/A;   BREAST SURGERY     breast bx   COLONOSCOPY     11 yr ago with dr coleman ? a few polyps, no report   KNEE SURGERY     right   LAPAROSCOPIC ASSISTED VAGINAL HYSTERECTOMY  01/17/2012   Procedure: LAPAROSCOPIC ASSISTED VAGINAL HYSTERECTOMY;  Surgeon: Rosaline LITTIE Cobble, MD;  Location: [facility];  Service: Gynecology;  Laterality: N/A;  attempted    SALPINGOOPHORECTOMY  01/17/2012   Procedure: SALPINGO OOPHORECTOMY;  Surgeon: Rosaline LITTIE Cobble, MD;  Location: [facility];  Service: Gynecology;  Laterality: Right;   Patient Active Problem List   Diagnosis Date Noted   CAD in native artery 03/30/2021   Elevated blood pressure reading 03/30/2021   Pure hypercholesterolemia 03/30/2021   Aortic atherosclerosis (HCC) 03/30/2021   Overweight (BMI 25.0-29.9) 08/20/2017   Vertigo 08/15/2017    Pulmonary nodule 04/30/2017   Acute bilateral low back pain without sciatica 04/30/2017   Plantar fasciitis, right 04/09/2017   Osteopenia 07/07/2015   Headache, migraine 03/30/2015   Delusional disorder (HCC) 11/17/2013    PCP: Burney Darice LITTIE, MD   REFERRING PROVIDER: Genelle Standing, MD  REFERRING DIAG:  left hamstring tendinitis; D23.697J (ICD-10-CM) - Left hamstring injury, initial encounter  M25.551 (ICD-10-CM) - Pain of right hip     S/p Rt hip labral repair  THERAPY DIAG:  Pain of right hip  Muscle weakness (generalized)  Other abnormalities of gait and mobility  Other symptoms and signs involving the musculoskeletal system  Left hamstring injury, initial encounter  Pain in right hip  Rationale for Evaluation and Treatment: Rehabilitation  ONSET DATE: June 2025 DOS 10/04/23  SUBJECTIVE:   SUBJECTIVE STATEMENT: Some lateral hip soreness.  PERTINENT HISTORY: Hx osteopenia, hx LBP, HLD  PAIN:  Are you having pain? Yes: NPRS scale: 2/10 Pain location: L glute/SIJ Pain description: sore Aggravating factors: bending Relieving factors: walking  PRECAUTIONS: None  WEIGHT BEARING RESTRICTIONS: No, WBAT post op  FALLS:  Has patient fallen in last 6 months? No    PLOF: Independent  PATIENT GOALS: get rid of hamstring pain.   OBJECTIVE: (objective measures from initial evaluation unless  otherwise dated)   Extreme difficulty/unable (0), Quite a bit of difficulty (1), Moderate difficulty (2), Little difficulty (3), No difficulty (4) Survey date:    Any of your usual work, housework or school activities 1  2. Usual hobbies, recreational or sporting activities 0  3. Getting into/out of the bath 1  4. Walking between rooms 3  5. Putting on socks/shoes 1  6. Squatting  0  7. Lifting an object, like a bag of groceries from the floor 0  8. Performing light activities around your home 1  9. Performing heavy activities around your home 0  10. Getting  into/out of a car 1  11. Walking 2 blocks 0  12. Walking 1 mile 0  13. Going up/down 10 stairs (1 flight) 0  14. Standing for 1 hour 0  15.  sitting for 1 hour 1  16. Running on even ground 0  17. Running on uneven ground 0  18. Making sharp turns while running fast 0  19. Hopping  0  20. Rolling over in bed 2  Score total:  11/80    POSTURE: No Significant postural limitations  PALPATION: TTP L glute max, piriformis, minimally at L hamstring origin   OBSERVATIONS   L LE significantly longer than R, sx and area of pain consistent with SIJ involvement   LOWER EXTREMITY ROM:   Passive ROM Right 9/4   Hip flexion    Hip extension    Hip abduction    Hip adduction    Hip internal rotation    Hip external rotation    Knee flexion    Knee extension    Ankle dorsiflexion    Ankle plantarflexion    Ankle inversion    Ankle eversion     (Blank rows = not tested) *= pain/symptoms  LOWER EXTREMITY MMT:  MMT Right eval Left eval Right 09/25/23 Left 09/25/23  Hip flexion 5 5    Hip extension 4+ 5 4+ 5  Hip abduction 4+ 5 5 5   Hip adduction      Hip internal rotation      Hip external rotation      Knee flexion 5 5    Knee extension 5 5    Ankle dorsiflexion      Ankle plantarflexion      Ankle inversion      Ankle eversion       (Blank rows = not tested) *= pain/symptoms   GAIT:  9/4 Comments:    TODAY'S TREATMENT:                                                                                                                              DATE:  10/18/23 Dressing change Manual: STM hip flexors/quads/TFL on R Bridge 2 x 10 TA activation with bent knee fall outs 2 x 10 Prone hamstring curl 2 x 10 Prone hip ER/IR 2 x 10   10/11/23 Changed bandages See HEP  Gait training to reduce anterior motion of hip ahead of trunk    PATIENT EDUCATION:  Education details: Anatomy of condition, POC, HEP, exercise form/rationale  Person educated: Patient Education  method: Explanation, Demonstration, and Handouts Education comprehension: verbalized understanding, returned demonstration, verbal cues required, and tactile cues required  HOME EXERCISE PROGRAM: Prehab- Access Code: [email or web address] URL: [email or web address]  Access Code: [email or web address] URL: [email or web address] Date: 10/11/2023 Prepared by: Harlene Cordon  Exercises - Lying Prone  - 3-4 x daily - 7 x weekly - 2-3 min hold - Prone Gluteal Sets  - 3-4 x daily - 7 x weekly - 1 sets - 10 reps - 3s hold - Prone Knee Flexion  - 3-4 x daily - 7 x weekly - 1 sets - 10 reps - Seated Hamstring Stretch  - 2-3 x daily - 7 x weekly - 2 sets - 5 breaths hold - Standing Heel Raise  - 1 x daily - 7 x weekly - 2-3 sets - 1 reps  ASSESSMENT:  CLINICAL IMPRESSION:   Dressing changed with no s/s of infection. Patient overall doing great. Discussed limitations/precautions and encouraged patient to continue mobilizing. Began additional glute strength and hip mobility exercises which are tolerated well. Patient will continue to benefit from physical therapy in order to improve function and reduce impairment.   OBJECTIVE IMPAIRMENTS: decreased activity tolerance, decreased balance, decreased endurance, decreased mobility, difficulty walking, decreased ROM, decreased strength, increased muscle spasms, impaired flexibility, improper body mechanics, and pain  ACTIVITY LIMITATIONS: lifting, bending, sitting, standing, squatting, stairs, transfers, locomotion level, and caring for others  PARTICIPATION LIMITATIONS: meal prep, cleaning, laundry, shopping, community activity, occupation, and yard work  PERSONAL FACTORS: 3+ comorbidities: Hx osteopenia, hx LBP, HLD, R hip pain are also affecting patient's functional outcome.   REHAB POTENTIAL: Good  CLINICAL DECISION MAKING: Evolving/moderate complexity  EVALUATION COMPLEXITY: Moderate   GOALS: Goals reviewed with patient?  Yes  SHORT TERM GOALS: Target date: 4 weeks 9/27  Pain free flexion to 90 Baseline: Goal status: INITIAL   2.  Demo controlled quad set with hold Baseline:  Goal status: INITIAL   3.  30s sit to stand without pain or compensation Baseline:  Goal status: INITIAL   4.  SLS 30s without compensation Baseline:  Goal status: INITIAL    LONG TERM GOALS:  Able to demo step up on 6 step with level pelvis and proper form Baseline:  Goal status: INITIAL Week 8 10/25  2.  Will tolerate at least 3 min on elliptical, demonstrating good tolerance to repetitive weight bearing motion Baseline:  Goal status: INITIAL  Week 8 10/25  3.  Demonstrate proper form in at least 10 continuous lunges without increased pain Baseline:  Goal status: INITIAL  Week 12 11/22  4.  Demonstrate ability to get up and down from the floor without limitation by hip pain Baseline:  Goal status: INITIAL Week 12 11/22  PLAN:  PT FREQUENCY: 1-2x/week  PT DURATION: 3 weeks  PLANNED INTERVENTIONS: 97164- PT Re-evaluation, 97110-Therapeutic exercises, 97530- Therapeutic activity, 97112- Neuromuscular re-education, 97535- Self Care, 02859- Manual therapy, [id number]- Gait training, [phone]- Orthotic Fit/training, [phone]- Canalith repositioning, [id number]- Aquatic Therapy, 97760- Splinting, [phone]- Wound care (first 20 sq cm), 97598- Wound care (each additional 20 sq cm)Patient/Family education, Balance training, Stair training, Taping, Dry Needling, Joint mobilization, Joint manipulation, Spinal manipulation, Spinal mobilization, Scar mobilization, and DME instructions.  PLAN FOR NEXT SESSION: f/u with HEP. Hip mobility, manual for pain/mobility, glute/hamstring strength on L. Complete any pre-op education and  HEP updates prior to surgery 8/28   Prentice RAMAN Dshaun Reppucci, PT 10/18/2023, 2:51 PM

## 2023-10-24 ENCOUNTER — Ambulatory Visit (INDEPENDENT_AMBULATORY_CARE_PROVIDER_SITE_OTHER): Payer: PRIVATE HEALTH INSURANCE | Admitting: Orthopaedic Surgery

## 2023-10-24 DIAGNOSIS — M25551 Pain in right hip: Secondary | ICD-10-CM

## 2023-10-24 NOTE — Progress Notes (Signed)
 Post Operative Evaluation    Procedure/Date of Surgery: Right labral repair 8/28  Interval History:   Presents today 2 weeks status post the above procedure.  Overall she is doing very well.  Denies any significant pain.  She is walking with normal gait   PMH/PSH/Family History/Social History/Meds/Allergies:    Past Medical History:  Diagnosis Date   Allergy    Anxiety    Aortic atherosclerosis (HCC) 03/30/2021   Asthma    in the past- no inhaler currently   Depression    Elevated blood pressure reading 03/30/2021   Genital warts    Hay fever    Migraine    Pure hypercholesterolemia 03/30/2021   Stress fracture of right foot    Past Surgical History:  Procedure Laterality Date   ABDOMINAL HYSTERECTOMY  01/17/2012   Procedure: HYSTERECTOMY ABDOMINAL;  Surgeon: Rosaline LITTIE Cobble, MD;  Location: [facility];  Service: Gynecology;  Laterality: N/A;   BREAST SURGERY     breast bx   COLONOSCOPY     11 yr ago with dr coleman ? a few polyps, no report   KNEE SURGERY     right   LAPAROSCOPIC ASSISTED VAGINAL HYSTERECTOMY  01/17/2012   Procedure: LAPAROSCOPIC ASSISTED VAGINAL HYSTERECTOMY;  Surgeon: Rosaline LITTIE Cobble, MD;  Location: [facility];  Service: Gynecology;  Laterality: N/A;  attempted    SALPINGOOPHORECTOMY  01/17/2012   Procedure: SALPINGO OOPHORECTOMY;  Surgeon: Rosaline LITTIE Cobble, MD;  Location: [facility];  Service: Gynecology;  Laterality: Right;   Social History   Socioeconomic History   Marital status: Married    Spouse name: Not on file   Number of children: Not on file   Years of education: Not on file   Highest education level: Not on file  Occupational History   Not on file  Tobacco Use   Smoking status: Never   Smokeless tobacco: Never  Vaping Use   Vaping status: Never Used  Substance and Sexual Activity   Alcohol use: Yes    Alcohol/week: 0.0 standard drinks of alcohol    Comment: Last drink was over a week ago.    Drug  use: No   Sexual activity: Never  Other Topics Concern   Not on file  Social History Narrative   Married.    Bachelor's of arts degree Pensions consultant    Wears a bicycle helmet, smoke alarm and home, wears her seatbelt.   Vegetarian, consumes dairy products.   Feels safe in her relationships.   Social Drivers of Corporate investment banker Strain: Low Risk  (03/30/2021)   Overall Financial Resource Strain (CARDIA)    Difficulty of Paying Living Expenses: Not hard at all  Food Insecurity: No Food Insecurity (03/30/2021)   Hunger Vital Sign    Worried About Running Out of Food in the Last Year: Never true    Ran Out of Food in the Last Year: Never true  Transportation Needs: No Transportation Needs (03/30/2021)   PRAPARE - Administrator, Civil Service (Medical): No    Lack of Transportation (Non-Medical): No  Physical Activity: Sufficiently Active (03/30/2021)   Exercise Vital Sign    Days of Exercise per Week: 7 days    Minutes of Exercise per Session: 30 min  Stress: Not on file  Social Connections:  Unknown (08/23/2022)   Received from [facility]   Social Network    Social Network: Not on file   Family History  Problem Relation Age of Onset   Cancer Mother        pancreatic   Pancreatic cancer Mother    Depression Mother    Cancer Father        basal cell, bladder   Heart disease Father    Cancer Sister        melanoma   Arthritis Sister    Asthma Sister    Diabetes Sister    Hyperlipidemia Sister    Hypertension Sister    Kidney disease Sister    Diabetes Sister    Hypertension Sister    Arthritis Sister    Heart attack Maternal Uncle    Heart attack Paternal Uncle    Stroke Paternal Grandmother    Colon cancer Neg Hx    Colon polyps Neg Hx    Esophageal cancer Neg Hx    Rectal cancer Neg Hx    Stomach cancer Neg Hx    No Known Allergies Current Outpatient Medications  Medication Sig Dispense Refill   acetaminophen  (TYLENOL ) 500 MG  tablet Take 1 tablet (500 mg total) by mouth every 6 (six) hours as needed for mild pain (pain score 1-3). 30 tablet 0   aspirin  EC 325 MG tablet Take 1 tablet (325 mg total) by mouth daily. 14 tablet 0   Coenzyme Q10 (CO Q 10) 100 MG CAPS Take 1 capsule by mouth daily at 12 noon.     fexofenadine-pseudoephedrine (ALLEGRA-D) 60-120 MG 12 hr tablet Take 1 tablet by mouth as needed.     ibuprofen  (ADVIL ) 800 MG tablet Take 1 tablet (800 mg total) by mouth every 8 (eight) hours as needed. 30 tablet 0   Multiple Vitamin (MULTIVITAMIN WITH MINERALS) TABS Take 1 tablet by mouth daily.     Omega 3-5-6-7-9 Fatty Acids (COMPLETE OMEGA) CAPS Take 1 capsule by mouth daily.     oxycodone  (OXY-IR) 5 MG capsule Take 1 capsule (5 mg total) by mouth every 4 (four) hours as needed. 20 capsule 0   rosuvastatin  (CRESTOR ) 10 MG tablet TAKE 1 TABLET BY MOUTH DAILY 90 tablet 3   No current facility-administered medications for this visit.   No results found.  Review of Systems:   A ROS was performed including pertinent positives and negatives as documented in the HPI.   Musculoskeletal Exam:    There were no vitals taken for this visit.  Right hip was well-appearing without erythema or drainage.  Good abduction strength normal gait neurovascular exam is intact  Imaging:      I personally reviewed and interpreted the radiographs.   Assessment:   2-week status post right hip labral repair overall doing extremely well.  At this time should continue progressing with weightbearing and strength and I will see her back in 4 weeks   Plan :    - Return to clinic 4 weeks      I personally saw and evaluated the patient, and participated in the management and treatment plan.  Elspeth Parker, MD Attending Physician, Orthopedic Surgery  This document was dictated using Dragon voice recognition software. A reasonable attempt at proof reading has been made to minimize errors.

## 2023-10-25 ENCOUNTER — Encounter (HOSPITAL_BASED_OUTPATIENT_CLINIC_OR_DEPARTMENT_OTHER): Payer: Self-pay | Admitting: Physical Therapy

## 2023-10-25 ENCOUNTER — Ambulatory Visit (HOSPITAL_BASED_OUTPATIENT_CLINIC_OR_DEPARTMENT_OTHER): Payer: PRIVATE HEALTH INSURANCE | Admitting: Physical Therapy

## 2023-10-25 DIAGNOSIS — M6281 Muscle weakness (generalized): Secondary | ICD-10-CM

## 2023-10-25 DIAGNOSIS — M25551 Pain in right hip: Secondary | ICD-10-CM

## 2023-10-25 NOTE — Therapy (Signed)
 OUTPATIENT PHYSICAL THERAPY LOWER EXTREMITY TREATMENT    Patient Name: Carolyn Gutierrez MRN: [id number] DOB:Sep 10, 1955, 68 y.o., female Today's Date: 10/25/2023  END OF SESSION:  PT End of Session - 10/25/23 1437     Visit Number 3    Number of Visits 15    Date for Recertification  01/05/24    Authorization Type Medicare    Progress Note Due on Visit 6    PT Start Time 1437    PT Stop Time 1512    PT Time Calculation (min) 35 min    Activity Tolerance Patient tolerated treatment well    Behavior During Therapy [facility] for tasks assessed/performed              Past Medical History:  Diagnosis Date   Allergy    Anxiety    Aortic atherosclerosis (HCC) 03/30/2021   Asthma    in the past- no inhaler currently   Depression    Elevated blood pressure reading 03/30/2021   Genital warts    Hay fever    Migraine    Pure hypercholesterolemia 03/30/2021   Stress fracture of right foot    Past Surgical History:  Procedure Laterality Date   ABDOMINAL HYSTERECTOMY  01/17/2012   Procedure: HYSTERECTOMY ABDOMINAL;  Surgeon: Rosaline LITTIE Cobble, MD;  Location: [facility];  Service: Gynecology;  Laterality: N/A;   BREAST SURGERY     breast bx   COLONOSCOPY     11 yr ago with dr coleman ? a few polyps, no report   KNEE SURGERY     right   LAPAROSCOPIC ASSISTED VAGINAL HYSTERECTOMY  01/17/2012   Procedure: LAPAROSCOPIC ASSISTED VAGINAL HYSTERECTOMY;  Surgeon: Rosaline LITTIE Cobble, MD;  Location: [facility];  Service: Gynecology;  Laterality: N/A;  attempted    SALPINGOOPHORECTOMY  01/17/2012   Procedure: SALPINGO OOPHORECTOMY;  Surgeon: Rosaline LITTIE Cobble, MD;  Location: [facility];  Service: Gynecology;  Laterality: Right;   Patient Active Problem List   Diagnosis Date Noted   CAD in native artery 03/30/2021   Elevated blood pressure reading 03/30/2021   Pure hypercholesterolemia 03/30/2021   Aortic atherosclerosis (HCC) 03/30/2021   Overweight (BMI 25.0-29.9) 08/20/2017   Vertigo 08/15/2017    Pulmonary nodule 04/30/2017   Acute bilateral low back pain without sciatica 04/30/2017   Plantar fasciitis, right 04/09/2017   Osteopenia 07/07/2015   Headache, migraine 03/30/2015   Delusional disorder (HCC) 11/17/2013    PCP: Burney Darice LITTIE, MD   REFERRING PROVIDER: Genelle Standing, MD  REFERRING DIAG:  left hamstring tendinitis; D23.697J (ICD-10-CM) - Left hamstring injury, initial encounter  M25.551 (ICD-10-CM) - Pain of right hip     S/p Rt hip labral repair  THERAPY DIAG:  Pain of right hip  Muscle weakness (generalized)  Rationale for Evaluation and Treatment: Rehabilitation  ONSET DATE: June 2025 DOS 10/04/23  SUBJECTIVE:   SUBJECTIVE STATEMENT: Buttock searing pain around ischial tuberosity region.   PERTINENT HISTORY: Hx osteopenia, hx LBP, HLD  PAIN:  Are you having pain? Yes: NPRS scale: 2/10 Pain location: L glute/SIJ Pain description: sore Aggravating factors: bending Relieving factors: walking  PRECAUTIONS: None  WEIGHT BEARING RESTRICTIONS: No, WBAT post op  FALLS:  Has patient fallen in last 6 months? No    PLOF: Independent  PATIENT GOALS: get rid of hamstring pain.   OBJECTIVE: (objective measures from initial evaluation unless otherwise dated)   Extreme difficulty/unable (0), Quite a bit of difficulty (1), Moderate difficulty (2), Little difficulty (3), No difficulty (4)  Survey date:    Any of your usual work, housework or school activities 1  2. Usual hobbies, recreational or sporting activities 0  3. Getting into/out of the bath 1  4. Walking between rooms 3  5. Putting on socks/shoes 1  6. Squatting  0  7. Lifting an object, like a bag of groceries from the floor 0  8. Performing light activities around your home 1  9. Performing heavy activities around your home 0  10. Getting into/out of a car 1  11. Walking 2 blocks 0  12. Walking 1 mile 0  13. Going up/down 10 stairs (1 flight) 0  14. Standing for 1 hour 0  15.   sitting for 1 hour 1  16. Running on even ground 0  17. Running on uneven ground 0  18. Making sharp turns while running fast 0  19. Hopping  0  20. Rolling over in bed 2  Score total:  11/80    POSTURE: No Significant postural limitations  PALPATION: TTP L glute max, piriformis, minimally at L hamstring origin   OBSERVATIONS   L LE significantly longer than R, sx and area of pain consistent with SIJ involvement   LOWER EXTREMITY ROM:   Passive ROM Right 9/4   Hip flexion    Hip extension    Hip abduction    Hip adduction    Hip internal rotation    Hip external rotation    Knee flexion    Knee extension    Ankle dorsiflexion    Ankle plantarflexion    Ankle inversion    Ankle eversion     (Blank rows = not tested) *= pain/symptoms  LOWER EXTREMITY MMT:  MMT Right eval Left eval Right 09/25/23 Left 09/25/23  Hip flexion 5 5    Hip extension 4+ 5 4+ 5  Hip abduction 4+ 5 5 5   Hip adduction      Hip internal rotation      Hip external rotation      Knee flexion 5 5    Knee extension 5 5    Ankle dorsiflexion      Ankle plantarflexion      Ankle inversion      Ankle eversion       (Blank rows = not tested) *= pain/symptoms   GAIT:  9/4 Comments:    TODAY'S TREATMENT:                                                                                                                              DATE:  10/25/23 Qped rocking- hamstring engagement, ab set, neutral pelvic tilt Sidelying 90/90 feet on wall, clam, ball bw ankles- cues for rib cage lift & neutral pelvis to avoid HS overuse Sidelying 90/90 feet on wall- hip flexion rolling on ball bw knees Gait- reduction of ant drift of femoral head in Rt stance phase Seated for car: pelvis twist, isometric add/abd Heel raises  with hips in neutral rather than drifting anteriorly   10/18/23 Dressing change Manual: STM hip flexors/quads/TFL on R Bridge 2 x 10 TA activation with bent knee fall outs 2 x  10 Prone hamstring curl 2 x 10 Prone hip ER/IR 2 x 10   10/11/23 Changed bandages See HEP Gait training to reduce anterior motion of hip ahead of trunk    PATIENT EDUCATION:  Education details: Anatomy of condition, POC, HEP, exercise form/rationale  Person educated: Patient Education method: Explanation, Demonstration, and Handouts Education comprehension: verbalized understanding, returned demonstration, verbal cues required, and tactile cues required  HOME EXERCISE PROGRAM: Prehab- Access Code: [email or web address] URL: [email or web address]  Access Code: [email or web address] URL: [email or web address] Date: 10/11/2023 Prepared by: Harlene Cordon  Exercises - Lying Prone  - 3-4 x daily - 7 x weekly - 2-3 min hold - Prone Gluteal Sets  - 3-4 x daily - 7 x weekly - 1 sets - 10 reps - 3s hold - Prone Knee Flexion  - 3-4 x daily - 7 x weekly - 1 sets - 10 reps - Seated Hamstring Stretch  - 2-3 x daily - 7 x weekly - 2 sets - 5 breaths hold - Standing Heel Raise  - 1 x daily - 7 x weekly - 2-3 sets - 1 reps  ASSESSMENT:  CLINICAL IMPRESSION:   Able to decrease deep glut discomfort with stretching and altering activation patterns. Will try exercises in the car to decrease discomfort.    OBJECTIVE IMPAIRMENTS: decreased activity tolerance, decreased balance, decreased endurance, decreased mobility, difficulty walking, decreased ROM, decreased strength, increased muscle spasms, impaired flexibility, improper body mechanics, and pain  ACTIVITY LIMITATIONS: lifting, bending, sitting, standing, squatting, stairs, transfers, locomotion level, and caring for others  PARTICIPATION LIMITATIONS: meal prep, cleaning, laundry, shopping, community activity, occupation, and yard work  PERSONAL FACTORS: 3+ comorbidities: Hx osteopenia, hx LBP, HLD, R hip pain are also affecting patient's functional outcome.   REHAB POTENTIAL: Good  CLINICAL DECISION MAKING: Evolving/moderate  complexity  EVALUATION COMPLEXITY: Moderate   GOALS: Goals reviewed with patient? Yes  SHORT TERM GOALS: Target date: 4 weeks 9/27  Pain free flexion to 90 Baseline: Goal status: INITIAL   2.  Demo controlled quad set with hold Baseline:  Goal status: INITIAL   3.  30s sit to stand without pain or compensation Baseline:  Goal status: INITIAL   4.  SLS 30s without compensation Baseline:  Goal status: INITIAL    LONG TERM GOALS:  Able to demo step up on 6 step with level pelvis and proper form Baseline:  Goal status: INITIAL Week 8 10/25  2.  Will tolerate at least 3 min on elliptical, demonstrating good tolerance to repetitive weight bearing motion Baseline:  Goal status: INITIAL  Week 8 10/25  3.  Demonstrate proper form in at least 10 continuous lunges without increased pain Baseline:  Goal status: INITIAL  Week 12 11/22  4.  Demonstrate ability to get up and down from the floor without limitation by hip pain Baseline:  Goal status: INITIAL Week 12 11/22  PLAN:  PT FREQUENCY: 1-2x/week  PT DURATION: 3 weeks  PLANNED INTERVENTIONS: 97164- PT Re-evaluation, 97110-Therapeutic exercises, 97530- Therapeutic activity, 97112- Neuromuscular re-education, 97535- Self Care, [id number]- Manual therapy, [id number]- Gait training, [phone]- Orthotic Fit/training, [phone]- Canalith repositioning, [id number]- Aquatic Therapy, 97760- Splinting, [phone]- Wound care (first 20 sq cm), 97598- Wound care (each additional 20 sq cm)Patient/Family education, Balance training, Stair training, Taping, Dry Needling, Joint mobilization, Joint manipulation, Spinal manipulation, Spinal mobilization,  Scar mobilization, and DME instructions.  PLAN FOR NEXT SESSION: f/u with HEP. Hip mobility, manual for pain/mobility, glute/hamstring strength on L.    Harlene Cordon, PT, DPT 10/25/2023, 3:13 PM

## 2023-11-01 ENCOUNTER — Ambulatory Visit (HOSPITAL_BASED_OUTPATIENT_CLINIC_OR_DEPARTMENT_OTHER): Admitting: Physical Therapy

## 2023-11-01 ENCOUNTER — Encounter (HOSPITAL_BASED_OUTPATIENT_CLINIC_OR_DEPARTMENT_OTHER): Payer: Self-pay | Admitting: Physical Therapy

## 2023-11-01 DIAGNOSIS — M25551 Pain in right hip: Secondary | ICD-10-CM

## 2023-11-01 DIAGNOSIS — R2689 Other abnormalities of gait and mobility: Secondary | ICD-10-CM

## 2023-11-01 DIAGNOSIS — M6281 Muscle weakness (generalized): Secondary | ICD-10-CM

## 2023-11-01 NOTE — Therapy (Signed)
 OUTPATIENT PHYSICAL THERAPY LOWER EXTREMITY TREATMENT    Patient Name: Carolyn Gutierrez MRN: [id number] DOB:Sep 06, 1955, 68 y.o., female Today's Date: 11/01/2023  END OF SESSION:  PT End of Session - 11/01/23 1430     Visit Number 4    Number of Visits 15    Date for Recertification  01/05/24    Authorization Type Medicare    Progress Note Due on Visit 6    PT Start Time 1430    PT Stop Time 1511    PT Time Calculation (min) 41 min    Activity Tolerance Patient tolerated treatment well    Behavior During Therapy [facility] for tasks assessed/performed              Past Medical History:  Diagnosis Date   Allergy    Anxiety    Aortic atherosclerosis 03/30/2021   Asthma    in the past- no inhaler currently   Depression    Elevated blood pressure reading 03/30/2021   Genital warts    Hay fever    Migraine    Pure hypercholesterolemia 03/30/2021   Stress fracture of right foot    Past Surgical History:  Procedure Laterality Date   ABDOMINAL HYSTERECTOMY  01/17/2012   Procedure: HYSTERECTOMY ABDOMINAL;  Surgeon: Rosaline LITTIE Cobble, MD;  Location: [facility];  Service: Gynecology;  Laterality: N/A;   BREAST SURGERY     breast bx   COLONOSCOPY     11 yr ago with dr coleman ? a few polyps, no report   KNEE SURGERY     right   LAPAROSCOPIC ASSISTED VAGINAL HYSTERECTOMY  01/17/2012   Procedure: LAPAROSCOPIC ASSISTED VAGINAL HYSTERECTOMY;  Surgeon: Rosaline LITTIE Cobble, MD;  Location: [facility];  Service: Gynecology;  Laterality: N/A;  attempted    SALPINGOOPHORECTOMY  01/17/2012   Procedure: SALPINGO OOPHORECTOMY;  Surgeon: Rosaline LITTIE Cobble, MD;  Location: [facility];  Service: Gynecology;  Laterality: Right;   Patient Active Problem List   Diagnosis Date Noted   CAD in native artery 03/30/2021   Elevated blood pressure reading 03/30/2021   Pure hypercholesterolemia 03/30/2021   Aortic atherosclerosis 03/30/2021   Overweight (BMI 25.0-29.9) 08/20/2017   Vertigo 08/15/2017   Pulmonary  nodule 04/30/2017   Acute bilateral low back pain without sciatica 04/30/2017   Plantar fasciitis, right 04/09/2017   Osteopenia 07/07/2015   Headache, migraine 03/30/2015   Delusional disorder (HCC) 11/17/2013    PCP: Burney Darice LITTIE, MD   REFERRING PROVIDER: Genelle Standing, MD  REFERRING DIAG:  left hamstring tendinitis; D23.697J (ICD-10-CM) - Left hamstring injury, initial encounter  M25.551 (ICD-10-CM) - Pain of right hip     S/p Rt hip labral repair  THERAPY DIAG:  Pain of right hip  Muscle weakness (generalized)  Other abnormalities of gait and mobility  Rationale for Evaluation and Treatment: Rehabilitation  ONSET DATE: June 2025 DOS 10/04/23  SUBJECTIVE:   SUBJECTIVE STATEMENT: Still have the discomfort at ischial tuberosity. I had a pop on my lateral hip.  PERTINENT HISTORY: Hx osteopenia, hx LBP, HLD  PAIN:  Are you having pain? Yes: NPRS scale: 2/10 Pain location: L glute/SIJ Pain description: sore Aggravating factors: bending Relieving factors: walking  PRECAUTIONS: None  WEIGHT BEARING RESTRICTIONS: No, WBAT post op  FALLS:  Has patient fallen in last 6 months? No    PLOF: Independent  PATIENT GOALS: get rid of hamstring pain.   OBJECTIVE: (objective measures from initial evaluation unless otherwise dated)   Extreme difficulty/unable (0), Quite a bit  of difficulty (1), Moderate difficulty (2), Little difficulty (3), No difficulty (4) Survey date:    Any of your usual work, housework or school activities 1  2. Usual hobbies, recreational or sporting activities 0  3. Getting into/out of the bath 1  4. Walking between rooms 3  5. Putting on socks/shoes 1  6. Squatting  0  7. Lifting an object, like a bag of groceries from the floor 0  8. Performing light activities around your home 1  9. Performing heavy activities around your home 0  10. Getting into/out of a car 1  11. Walking 2 blocks 0  12. Walking 1 mile 0  13. Going up/down 10  stairs (1 flight) 0  14. Standing for 1 hour 0  15.  sitting for 1 hour 1  16. Running on even ground 0  17. Running on uneven ground 0  18. Making sharp turns while running fast 0  19. Hopping  0  20. Rolling over in bed 2  Score total:  11/80    POSTURE: No Significant postural limitations  PALPATION: TTP L glute max, piriformis, minimally at L hamstring origin   OBSERVATIONS   L LE significantly longer than R, sx and area of pain consistent with SIJ involvement   LOWER EXTREMITY ROM:   Passive ROM Right 9/4   Hip flexion    Hip extension    Hip abduction    Hip adduction    Hip internal rotation    Hip external rotation    Knee flexion    Knee extension    Ankle dorsiflexion    Ankle plantarflexion    Ankle inversion    Ankle eversion     (Blank rows = not tested) *= pain/symptoms  LOWER EXTREMITY MMT:  MMT Right eval Left eval Right 09/25/23 Left 09/25/23  Hip flexion 5 5    Hip extension 4+ 5 4+ 5  Hip abduction 4+ 5 5 5   Hip adduction      Hip internal rotation      Hip external rotation      Knee flexion 5 5    Knee extension 5 5    Ankle dorsiflexion      Ankle plantarflexion      Ankle inversion      Ankle eversion       (Blank rows = not tested) *= pain/symptoms   GAIT:  9/4 Comments:    TODAY'S TREATMENT:                                                                                                                              DATE:  11/01/23 Standing flexed on elbows- hip hike ball bw kneees Sidelying 90/90 feet on wall- hip flexion rolling on ball bw knees- removed ball and added Rt adduction today STM Rt hip external rotators, TFL Standing hip hinge   10/25/23 Qped rocking- hamstring engagement, ab set, neutral pelvic tilt Sidelying 90/90  feet on wall, clam, ball bw ankles- cues for rib cage lift & neutral pelvis to avoid HS overuse Sidelying 90/90 feet on wall- hip flexion rolling on ball bw knees Gait- reduction of ant drift of  femoral head in Rt stance phase Seated for car: pelvis twist, isometric add/abd Heel raises with hips in neutral rather than drifting anteriorly   10/18/23 Dressing change Manual: STM hip flexors/quads/TFL on R Bridge 2 x 10 TA activation with bent knee fall outs 2 x 10 Prone hamstring curl 2 x 10 Prone hip ER/IR 2 x 10   10/11/23 Changed bandages See HEP Gait training to reduce anterior motion of hip ahead of trunk    PATIENT EDUCATION:  Education details: Anatomy of condition, POC, HEP, exercise form/rationale  Person educated: Patient Education method: Explanation, Demonstration, and Handouts Education comprehension: verbalized understanding, returned demonstration, verbal cues required, and tactile cues required  HOME EXERCISE PROGRAM: Prehab- Access Code: [email or web address] URL: [email or web address]  Access Code: [email or web address] URL: [email or web address] Date: 10/11/2023 Prepared by: Harlene Cordon  Exercises - Lying Prone  - 3-4 x daily - 7 x weekly - 2-3 min hold - Prone Gluteal Sets  - 3-4 x daily - 7 x weekly - 1 sets - 10 reps - 3s hold - Prone Knee Flexion  - 3-4 x daily - 7 x weekly - 1 sets - 10 reps - Seated Hamstring Stretch  - 2-3 x daily - 7 x weekly - 2 sets - 5 breaths hold - Standing Heel Raise  - 1 x daily - 7 x weekly - 2-3 sets - 1 reps  ASSESSMENT:  CLINICAL IMPRESSION:   Able to decrease burning sensation with eccentric motions through hip rotators. STM to quadratus femoris. Excellent hip hinge form with cues- tends to lock knees for stability.    OBJECTIVE IMPAIRMENTS: decreased activity tolerance, decreased balance, decreased endurance, decreased mobility, difficulty walking, decreased ROM, decreased strength, increased muscle spasms, impaired flexibility, improper body mechanics, and pain  ACTIVITY LIMITATIONS: lifting, bending, sitting, standing, squatting, stairs, transfers, locomotion level, and caring for  others  PARTICIPATION LIMITATIONS: meal prep, cleaning, laundry, shopping, community activity, occupation, and yard work  PERSONAL FACTORS: 3+ comorbidities: Hx osteopenia, hx LBP, HLD, R hip pain are also affecting patient's functional outcome.   REHAB POTENTIAL: Good  CLINICAL DECISION MAKING: Evolving/moderate complexity  EVALUATION COMPLEXITY: Moderate   GOALS: Goals reviewed with patient? Yes  SHORT TERM GOALS: Target date: 4 weeks 9/27  Pain free flexion to 90 Baseline: Goal status: INITIAL   2.  Demo controlled quad set with hold Baseline:  Goal status: INITIAL   3.  30s sit to stand without pain or compensation Baseline:  Goal status: INITIAL   4.  SLS 30s without compensation Baseline:  Goal status: INITIAL    LONG TERM GOALS:  Able to demo step up on 6 step with level pelvis and proper form Baseline:  Goal status: INITIAL Week 8 10/25  2.  Will tolerate at least 3 min on elliptical, demonstrating good tolerance to repetitive weight bearing motion Baseline:  Goal status: INITIAL  Week 8 10/25  3.  Demonstrate proper form in at least 10 continuous lunges without increased pain Baseline:  Goal status: INITIAL  Week 12 11/22  4.  Demonstrate ability to get up and down from the floor without limitation by hip pain Baseline:  Goal status: INITIAL Week 12 11/22  PLAN:  PT FREQUENCY: 1-2x/week  PT DURATION: 3 weeks  PLANNED INTERVENTIONS: 97164- PT Re-evaluation, 97110-Therapeutic  exercises, 97530- Therapeutic activity, [id number]- Neuromuscular re-education, [phone]- Self Care, [phone]- Manual therapy, [phone]- Gait training, [phone]- Orthotic Fit/training, [phone]- Canalith repositioning, [id number]- Aquatic Therapy, [phone]- Splinting, 97597- Wound care (first 20 sq cm), 97598- Wound care (each additional 20 sq cm)Patient/Family education, Balance training, Stair training, Taping, Dry Needling, Joint mobilization, Joint manipulation, Spinal manipulation, Spinal  mobilization, Scar mobilization, and DME instructions.  PLAN FOR NEXT SESSION: f/u with HEP. Hip mobility, manual for pain/mobility, glute/hamstring strength on L.    Harlene Cordon, PT, DPT 11/01/2023, 3:13 PM

## 2023-11-06 ENCOUNTER — Ambulatory Visit (HOSPITAL_BASED_OUTPATIENT_CLINIC_OR_DEPARTMENT_OTHER): Payer: PRIVATE HEALTH INSURANCE | Admitting: Physical Therapy

## 2023-11-06 ENCOUNTER — Encounter (HOSPITAL_BASED_OUTPATIENT_CLINIC_OR_DEPARTMENT_OTHER): Payer: Self-pay | Admitting: Physical Therapy

## 2023-11-06 DIAGNOSIS — M25551 Pain in right hip: Secondary | ICD-10-CM | POA: Diagnosis not present

## 2023-11-06 DIAGNOSIS — M6281 Muscle weakness (generalized): Secondary | ICD-10-CM

## 2023-11-06 DIAGNOSIS — R2689 Other abnormalities of gait and mobility: Secondary | ICD-10-CM

## 2023-11-06 DIAGNOSIS — R29898 Other symptoms and signs involving the musculoskeletal system: Secondary | ICD-10-CM

## 2023-11-06 NOTE — Therapy (Cosign Needed Addendum)
 OUTPATIENT PHYSICAL THERAPY LOWER EXTREMITY TREATMENT    Patient Name: Carolyn Gutierrez MRN: [id number] DOB:13-Jan-1956, 68 y.o., female Today's Date: 11/06/2023  END OF SESSION:  PT End of Session - 11/06/23 1428     Visit Number 5    Number of Visits 15    Date for Recertification  01/05/24    Authorization Type Medicare    Progress Note Due on Visit 6    PT Start Time 1430    PT Stop Time 1513    PT Time Calculation (min) 43 min    Activity Tolerance Patient tolerated treatment well    Behavior During Therapy [facility] for tasks assessed/performed           Past Medical History:  Diagnosis Date   Allergy    Anxiety    Aortic atherosclerosis 03/30/2021   Asthma    in the past- no inhaler currently   Depression    Elevated blood pressure reading 03/30/2021   Genital warts    Hay fever    Migraine    Pure hypercholesterolemia 03/30/2021   Stress fracture of right foot    Past Surgical History:  Procedure Laterality Date   ABDOMINAL HYSTERECTOMY  01/17/2012   Procedure: HYSTERECTOMY ABDOMINAL;  Surgeon: Rosaline LITTIE Cobble, MD;  Location: [facility];  Service: Gynecology;  Laterality: N/A;   BREAST SURGERY     breast bx   COLONOSCOPY     11 yr ago with dr coleman ? a few polyps, no report   KNEE SURGERY     right   LAPAROSCOPIC ASSISTED VAGINAL HYSTERECTOMY  01/17/2012   Procedure: LAPAROSCOPIC ASSISTED VAGINAL HYSTERECTOMY;  Surgeon: Rosaline LITTIE Cobble, MD;  Location: [facility];  Service: Gynecology;  Laterality: N/A;  attempted    SALPINGOOPHORECTOMY  01/17/2012   Procedure: SALPINGO OOPHORECTOMY;  Surgeon: Rosaline LITTIE Cobble, MD;  Location: [facility];  Service: Gynecology;  Laterality: Right;   Patient Active Problem List   Diagnosis Date Noted   CAD in native artery 03/30/2021   Elevated blood pressure reading 03/30/2021   Pure hypercholesterolemia 03/30/2021   Aortic atherosclerosis 03/30/2021   Overweight (BMI 25.0-29.9) 08/20/2017   Vertigo 08/15/2017   Pulmonary nodule  04/30/2017   Acute bilateral low back pain without sciatica 04/30/2017   Plantar fasciitis, right 04/09/2017   Osteopenia 07/07/2015   Headache, migraine 03/30/2015   Delusional disorder (HCC) 11/17/2013    PCP: Burney Darice LITTIE, MD   REFERRING PROVIDER: Genelle Standing, MD  REFERRING DIAG:  left hamstring tendinitis; D23.697J (ICD-10-CM) - Left hamstring injury, initial encounter  M25.551 (ICD-10-CM) - Pain of right hip     S/p Rt hip labral repair  THERAPY DIAG:  Pain of right hip  Muscle weakness (generalized)  Other abnormalities of gait and mobility  Other symptoms and signs involving the musculoskeletal system  Rationale for Evaluation and Treatment: Rehabilitation  ONSET DATE: June 2025 DOS 10/04/23  SUBJECTIVE:   SUBJECTIVE STATEMENT: Doing well today. Not too sore after previous session.  PERTINENT HISTORY: Hx osteopenia, hx LBP, HLD  PAIN:  Are you having pain? Yes: NPRS scale: 2/10 Pain location: L glute/SIJ Pain description: sore Aggravating factors: bending Relieving factors: walking  PRECAUTIONS: None  WEIGHT BEARING RESTRICTIONS: No, WBAT post op  FALLS:  Has patient fallen in last 6 months? No    PLOF: Independent  PATIENT GOALS: get rid of hamstring pain.   OBJECTIVE: (objective measures from initial evaluation unless otherwise dated)   Extreme difficulty/unable (0), Quite a bit  of difficulty (1), Moderate difficulty (2), Little difficulty (3), No difficulty (4) Survey date:    Any of your usual work, housework or school activities 1  2. Usual hobbies, recreational or sporting activities 0  3. Getting into/out of the bath 1  4. Walking between rooms 3  5. Putting on socks/shoes 1  6. Squatting  0  7. Lifting an object, like a bag of groceries from the floor 0  8. Performing light activities around your home 1  9. Performing heavy activities around your home 0  10. Getting into/out of a car 1  11. Walking 2 blocks 0  12.  Walking 1 mile 0  13. Going up/down 10 stairs (1 flight) 0  14. Standing for 1 hour 0  15.  sitting for 1 hour 1  16. Running on even ground 0  17. Running on uneven ground 0  18. Making sharp turns while running fast 0  19. Hopping  0  20. Rolling over in bed 2  Score total:  11/80    POSTURE: No Significant postural limitations  PALPATION: TTP L glute max, piriformis, minimally at L hamstring origin   OBSERVATIONS   L LE significantly longer than R, sx and area of pain consistent with SIJ involvement   LOWER EXTREMITY ROM:   Passive ROM Right 9/4   Hip flexion    Hip extension    Hip abduction    Hip adduction    Hip internal rotation    Hip external rotation    Knee flexion    Knee extension    Ankle dorsiflexion    Ankle plantarflexion    Ankle inversion    Ankle eversion     (Blank rows = not tested) *= pain/symptoms  LOWER EXTREMITY MMT:  MMT Right eval Left eval Right 09/25/23 Left 09/25/23  Hip flexion 5 5    Hip extension 4+ 5 4+ 5  Hip abduction 4+ 5 5 5   Hip adduction      Hip internal rotation      Hip external rotation      Knee flexion 5 5    Knee extension 5 5    Ankle dorsiflexion      Ankle plantarflexion      Ankle inversion      Ankle eversion       (Blank rows = not tested) *= pain/symptoms   GAIT:  9/4 Comments:    TODAY'S TREATMENT:                                                                                                                              DATE:  11/06/23 STM to right piriformis  Glute bridges 3x10 Prone hamstring curls 2.5# 3x10 LAQ 3x10 3.5# Mini squats 2x10 Sit to stand 2x10 Step ups 4 inch step x10 Step taps 4 inch step x10 Supine active hamstring stretch x10 SLS 3x30 seconds   11/01/23 Standing flexed on elbows- hip  hike ball bw kneees Sidelying 90/90 feet on wall- hip flexion rolling on ball bw knees- removed ball and added Rt adduction today STM Rt hip external rotators, TFL Standing hip  hinge   10/25/23 Qped rocking- hamstring engagement, ab set, neutral pelvic tilt Sidelying 90/90 feet on wall, clam, ball bw ankles- cues for rib cage lift & neutral pelvis to avoid HS overuse Sidelying 90/90 feet on wall- hip flexion rolling on ball bw knees Gait- reduction of ant drift of femoral head in Rt stance phase Seated for car: pelvis twist, isometric add/abd Heel raises with hips in neutral rather than drifting anteriorly   10/18/23 Dressing change Manual: STM hip flexors/quads/TFL on R Bridge 2 x 10 TA activation with bent knee fall outs 2 x 10 Prone hamstring curl 2 x 10 Prone hip ER/IR 2 x 10   10/11/23 Changed bandages See HEP Gait training to reduce anterior motion of hip ahead of trunk  PATIENT EDUCATION:  Education details: Anatomy of condition, POC, HEP, exercise form/rationale  Person educated: Patient Education method: Explanation, Demonstration, and Handouts Education comprehension: verbalized understanding, returned demonstration, verbal cues required, and tactile cues required  HOME EXERCISE PROGRAM: Prehab- Access Code: [email or web address] URL: [email or web address]  Access Code: [email or web address] URL: [email or web address] Date: 10/11/2023 Prepared by: Harlene Cordon  Exercises - Lying Prone  - 3-4 x daily - 7 x weekly - 2-3 min hold - Prone Gluteal Sets  - 3-4 x daily - 7 x weekly - 1 sets - 10 reps - 3s hold - Prone Knee Flexion  - 3-4 x daily - 7 x weekly - 1 sets - 10 reps - Seated Hamstring Stretch  - 2-3 x daily - 7 x weekly - 2 sets - 5 breaths hold - Standing Heel Raise  - 1 x daily - 7 x weekly - 2-3 sets - 1 reps  ASSESSMENT:  CLINICAL IMPRESSION: Patient tolerated new exercises well with no significant increase in pain. Manual therapy utilized to decrease pain and stiffness in R piriformis. Pt responded well to manual therapy with a decrease in pain. Continued to have tightness in R posterior hip throughout session.  Exercises focused on progressing quad and glute strength to increase activity tolerance and return to prior level of function. Advised to return to walking at home to tolerance. Educated on expected soreness after session today. Updated HEP with SLS and sit to stands. Progress as tolerated.   OBJECTIVE IMPAIRMENTS: decreased activity tolerance, decreased balance, decreased endurance, decreased mobility, difficulty walking, decreased ROM, decreased strength, increased muscle spasms, impaired flexibility, improper body mechanics, and pain  ACTIVITY LIMITATIONS: lifting, bending, sitting, standing, squatting, stairs, transfers, locomotion level, and caring for others  PARTICIPATION LIMITATIONS: meal prep, cleaning, laundry, shopping, community activity, occupation, and yard work  PERSONAL FACTORS: 3+ comorbidities: Hx osteopenia, hx LBP, HLD, R hip pain are also affecting patient's functional outcome.   REHAB POTENTIAL: Good  CLINICAL DECISION MAKING: Evolving/moderate complexity  EVALUATION COMPLEXITY: Moderate   GOALS: Goals reviewed with patient? Yes  SHORT TERM GOALS: Target date: 4 weeks 9/27  Pain free flexion to 90 Baseline: Goal status: INITIAL   2.  Demo controlled quad set with hold Baseline:  Goal status: INITIAL   3.  30s sit to stand without pain or compensation Baseline:  Goal status: INITIAL   4.  SLS 30s without compensation Baseline:  Goal status: INITIAL    LONG TERM GOALS:  Able to demo step up on 6 step with level pelvis and proper form Baseline:  Goal status: INITIAL Week 8 10/25  2.  Will tolerate at least 3 min on elliptical, demonstrating good tolerance to repetitive weight bearing motion Baseline:  Goal status: INITIAL  Week 8 10/25  3.  Demonstrate proper form in at least 10 continuous lunges without increased pain Baseline:  Goal status: INITIAL  Week 12 11/22  4.  Demonstrate ability to get up and down from the floor without limitation by  hip pain Baseline:  Goal status: INITIAL Week 12 11/22  PLAN:  PT FREQUENCY: 1-2x/week  PT DURATION: 3 weeks  PLANNED INTERVENTIONS: 97164- PT Re-evaluation, 97110-Therapeutic exercises, 97530- Therapeutic activity, 97112- Neuromuscular re-education, 97535- Self Care, 02859- Manual therapy, [id number]- Gait training, [phone]- Orthotic Fit/training, [phone]- Canalith repositioning, [id number]- Aquatic Therapy, 97760- Splinting, [phone]- Wound care (first 20 sq cm), 97598- Wound care (each additional 20 sq cm)Patient/Family education, Balance training, Stair training, Taping, Dry Needling, Joint mobilization, Joint manipulation, Spinal manipulation, Spinal mobilization, Scar mobilization, and DME instructions.  PLAN FOR NEXT SESSION: f/u with HEP. Hip mobility, manual for pain/mobility, glute/hamstring strength on L.   Lili Finder, Student-PT 11/06/2023, 3:11 PM   This entire session was performed under direct supervision and direction of a licensed therapist/therapist assistant . I have personally read, edited and approve of the note as written.  4:06 PM, 11/06/23 Prentice CANDIE Stains PT, DPT Physical Therapist at [facility]

## 2023-11-07 ENCOUNTER — Ambulatory Visit (HOSPITAL_BASED_OUTPATIENT_CLINIC_OR_DEPARTMENT_OTHER): Payer: PRIVATE HEALTH INSURANCE | Admitting: Physical Therapy

## 2023-11-08 ENCOUNTER — Encounter (HOSPITAL_BASED_OUTPATIENT_CLINIC_OR_DEPARTMENT_OTHER): Admitting: Physical Therapy

## 2023-11-09 ENCOUNTER — Ambulatory Visit (HOSPITAL_BASED_OUTPATIENT_CLINIC_OR_DEPARTMENT_OTHER): Payer: Self-pay | Attending: Orthopedic Surgery | Admitting: Physical Therapy

## 2023-11-09 ENCOUNTER — Encounter (HOSPITAL_BASED_OUTPATIENT_CLINIC_OR_DEPARTMENT_OTHER): Payer: Self-pay | Admitting: Physical Therapy

## 2023-11-09 DIAGNOSIS — M6281 Muscle weakness (generalized): Secondary | ICD-10-CM | POA: Diagnosis present

## 2023-11-09 DIAGNOSIS — R29898 Other symptoms and signs involving the musculoskeletal system: Secondary | ICD-10-CM | POA: Diagnosis present

## 2023-11-09 DIAGNOSIS — R2689 Other abnormalities of gait and mobility: Secondary | ICD-10-CM | POA: Diagnosis present

## 2023-11-09 DIAGNOSIS — M25551 Pain in right hip: Secondary | ICD-10-CM | POA: Diagnosis present

## 2023-11-09 NOTE — Therapy (Addendum)
 OUTPATIENT PHYSICAL THERAPY LOWER EXTREMITY TREATMENT    Patient Name: Carolyn Gutierrez MRN: [id number] DOB:[birth year], 68 y.o., female Today's Date: 11/09/2023  END OF SESSION:  PT End of Session - 11/09/23 1048     Visit Number 6    Number of Visits 15    Date for Recertification  01/05/24    Authorization Type Medicare    Progress Note Due on Visit 10    PT Start Time 1058    PT Stop Time 1140    PT Time Calculation (min) 42 min    Activity Tolerance Patient tolerated treatment well    Behavior During Therapy [facility] for tasks assessed/performed           Past Medical History:  Diagnosis Date   Allergy    Anxiety    Aortic atherosclerosis 03/30/2021   Asthma    in the past- no inhaler currently   Depression    Elevated blood pressure reading 03/30/2021   Genital warts    Hay fever    Migraine    Pure hypercholesterolemia 03/30/2021   Stress fracture of right foot    Past Surgical History:  Procedure Laterality Date   ABDOMINAL HYSTERECTOMY  01/17/2012   Procedure: HYSTERECTOMY ABDOMINAL;  Surgeon: Rosaline LITTIE Cobble, MD;  Location: [facility];  Service: Gynecology;  Laterality: N/A;   BREAST SURGERY     breast bx   COLONOSCOPY     11 yr ago with dr coleman ? a few polyps, no report   KNEE SURGERY     right   LAPAROSCOPIC ASSISTED VAGINAL HYSTERECTOMY  01/17/2012   Procedure: LAPAROSCOPIC ASSISTED VAGINAL HYSTERECTOMY;  Surgeon: Rosaline LITTIE Cobble, MD;  Location: [facility];  Service: Gynecology;  Laterality: N/A;  attempted    SALPINGOOPHORECTOMY  01/17/2012   Procedure: SALPINGO OOPHORECTOMY;  Surgeon: Rosaline LITTIE Cobble, MD;  Location: [facility];  Service: Gynecology;  Laterality: Right;   Patient Active Problem List   Diagnosis Date Noted   CAD in native artery 03/30/2021   Elevated blood pressure reading 03/30/2021   Pure hypercholesterolemia 03/30/2021   Aortic atherosclerosis 03/30/2021   Overweight (BMI 25.0-29.9) 08/20/2017   Vertigo 08/15/2017   Pulmonary  nodule 04/30/2017   Acute bilateral low back pain without sciatica 04/30/2017   Plantar fasciitis, right 04/09/2017   Osteopenia 07/07/2015   Headache, migraine 03/30/2015   Delusional disorder (HCC) 11/17/2013    PCP: Burney Darice LITTIE, MD   REFERRING PROVIDER: Genelle Standing, MD  REFERRING DIAG:  left hamstring tendinitis; D23.697J (ICD-10-CM) - Left hamstring injury, initial encounter  M25.551 (ICD-10-CM) - Pain of right hip     S/p Rt hip labral repair  THERAPY DIAG:  Pain of right hip  Muscle weakness (generalized)  Other abnormalities of gait and mobility  Other symptoms and signs involving the musculoskeletal system  Rationale for Evaluation and Treatment: Rehabilitation  ONSET DATE: June 2025 DOS 10/04/23  SUBJECTIVE:   SUBJECTIVE STATEMENT: Doing well today. Not too sore after previous session. Some stiffness on front, side, and back of hip.    PERTINENT HISTORY: Hx osteopenia, hx LBP, HLD  PAIN:  Are you having pain? Yes: NPRS scale: 2/10 Pain location: L glute/SIJ Pain description: sore Aggravating factors: bending Relieving factors: walking  PRECAUTIONS: None  WEIGHT BEARING RESTRICTIONS: No, WBAT post op  FALLS:  Has patient fallen in last 6 months? No    PLOF: Independent  PATIENT GOALS: get rid of hamstring pain.   OBJECTIVE: (objective measures from initial evaluation  unless otherwise dated)   Extreme difficulty/unable (0), Quite a bit of difficulty (1), Moderate difficulty (2), Little difficulty (3), No difficulty (4) Survey date:    Any of your usual work, housework or school activities 1  2. Usual hobbies, recreational or sporting activities 0  3. Getting into/out of the bath 1  4. Walking between rooms 3  5. Putting on socks/shoes 1  6. Squatting  0  7. Lifting an object, like a bag of groceries from the floor 0  8. Performing light activities around your home 1  9. Performing heavy activities around your home 0  10. Getting  into/out of a car 1  11. Walking 2 blocks 0  12. Walking 1 mile 0  13. Going up/down 10 stairs (1 flight) 0  14. Standing for 1 hour 0  15.  sitting for 1 hour 1  16. Running on even ground 0  17. Running on uneven ground 0  18. Making sharp turns while running fast 0  19. Hopping  0  20. Rolling over in bed 2  Score total:  11/80    POSTURE: No Significant postural limitations  PALPATION: TTP L glute max, piriformis, minimally at L hamstring origin   OBSERVATIONS   L LE significantly longer than R, sx and area of pain consistent with SIJ involvement   LOWER EXTREMITY ROM:   Passive ROM Right 9/4   Hip flexion    Hip extension    Hip abduction    Hip adduction    Hip internal rotation    Hip external rotation    Knee flexion    Knee extension    Ankle dorsiflexion    Ankle plantarflexion    Ankle inversion    Ankle eversion     (Blank rows = not tested) *= pain/symptoms  LOWER EXTREMITY MMT:  MMT Right eval Left eval Right 09/25/23 Left 09/25/23  Hip flexion 5 5    Hip extension 4+ 5 4+ 5  Hip abduction 4+ 5 5 5   Hip adduction      Hip internal rotation      Hip external rotation      Knee flexion 5 5    Knee extension 5 5    Ankle dorsiflexion      Ankle plantarflexion      Ankle inversion      Ankle eversion       (Blank rows = not tested) *= pain/symptoms   GAIT:  9/4 Comments:    TODAY'S TREATMENT:                                                                                                                              DATE:  11/09/23 STM to right piriformis, quads, iliopsoas Glute bridges 3x10 Prone hamstring curl 3# 3x10 LAQ 3x10 5#  Sit to stand 3x10 Step ups 4 inch step 2x10 Standing flexed on elbows- hip hike ball bw kneees Lateral stepping with  GTB x5 laps down and back on rail Thomas stretch 3x30 seconds  Active hamstring stretch 3x30 seconds   11/06/23 STM to right piriformis  Glute bridges 3x10 Prone hamstring curls 2.5#  3x10 LAQ 3x10 3.5# Mini squats 2x10 Sit to stand 2x10 Step ups 4 inch step x10 Step taps 4 inch step x10 Supine active hamstring stretch x10 SLS 3x30 seconds   11/01/23 Standing flexed on elbows- hip hike ball bw kneees Sidelying 90/90 feet on wall- hip flexion rolling on ball bw knees- removed ball and added Rt adduction today STM Rt hip external rotators, TFL Standing hip hinge   10/25/23 Qped rocking- hamstring engagement, ab set, neutral pelvic tilt Sidelying 90/90 feet on wall, clam, ball bw ankles- cues for rib cage lift & neutral pelvis to avoid HS overuse Sidelying 90/90 feet on wall- hip flexion rolling on ball bw knees Gait- reduction of ant drift of femoral head in Rt stance phase Seated for car: pelvis twist, isometric add/abd Heel raises with hips in neutral rather than drifting anteriorly   10/18/23 Dressing change Manual: STM hip flexors/quads/TFL on R Bridge 2 x 10 TA activation with bent knee fall outs 2 x 10 Prone hamstring curl 2 x 10 Prone hip ER/IR 2 x 10  Hip flexion stretch with non-surgical leg on table 2x10 with 5 second hold  10/11/23 Changed bandages See HEP Gait training to reduce anterior motion of hip ahead of trunk  PATIENT EDUCATION:  Education details: Anatomy of condition, POC, HEP, exercise form/rationale  Person educated: Patient Education method: Explanation, Demonstration, and Handouts Education comprehension: verbalized understanding, returned demonstration, verbal cues required, and tactile cues required  HOME EXERCISE PROGRAM: Prehab- Access Code: [email or web address] URL: [email or web address]  Access Code: [email or web address] URL: [email or web address] Date: 10/11/2023 Prepared by: Harlene Cordon  Exercises - Lying Prone  - 3-4 x daily - 7 x weekly - 2-3 min hold - Prone Gluteal Sets  - 3-4 x daily - 7 x weekly - 1 sets - 10 reps - 3s hold - Prone Knee Flexion  - 3-4 x daily - 7 x weekly - 1 sets - 10 reps -  Seated Hamstring Stretch  - 2-3 x daily - 7 x weekly - 2 sets - 5 breaths hold - Standing Heel Raise  - 1 x daily - 7 x weekly - 2-3 sets - 1 reps  ASSESSMENT:  CLINICAL IMPRESSION: Patient tolerated new exercises well with no significant increase in pain. Manual therapy utilized to decrease pain and stiffness in R piriformis, quad, and iliopsoas. Responded well to manual. Exercises focused on functional strengthening of the lower extremities and stretching tight musculature. Educated on stretching exercises at home and working toward increased hip flexion during ADLs. Remains limited by strength, activity tolerance, and muscle endurance and will continue to benefit from therapy to address remaining limitations.   OBJECTIVE IMPAIRMENTS: decreased activity tolerance, decreased balance, decreased endurance, decreased mobility, difficulty walking, decreased ROM, decreased strength, increased muscle spasms, impaired flexibility, improper body mechanics, and pain  ACTIVITY LIMITATIONS: lifting, bending, sitting, standing, squatting, stairs, transfers, locomotion level, and caring for others  PARTICIPATION LIMITATIONS: meal prep, cleaning, laundry, shopping, community activity, occupation, and yard work  PERSONAL FACTORS: 3+ comorbidities: Hx osteopenia, hx LBP, HLD, R hip pain are also affecting patient's functional outcome.   REHAB POTENTIAL: Good  CLINICAL DECISION MAKING: Evolving/moderate complexity  EVALUATION COMPLEXITY: Moderate   GOALS: Goals reviewed with patient? Yes  SHORT TERM GOALS: Target date: 4 weeks 9/27  Pain free flexion to  90 Baseline: Goal status: INITIAL   2.  Demo controlled quad set with hold Baseline:  Goal status: INITIAL   3.  30s sit to stand without pain or compensation Baseline:  Goal status: INITIAL   4.  SLS 30s without compensation Baseline:  Goal status: INITIAL    LONG TERM GOALS:  Able to demo step up on 6 step with level pelvis and  proper form Baseline:  Goal status: INITIAL Week 8 10/25  2.  Will tolerate at least 3 min on elliptical, demonstrating good tolerance to repetitive weight bearing motion Baseline:  Goal status: INITIAL  Week 8 10/25  3.  Demonstrate proper form in at least 10 continuous lunges without increased pain Baseline:  Goal status: INITIAL  Week 12 11/22  4.  Demonstrate ability to get up and down from the floor without limitation by hip pain Baseline:  Goal status: INITIAL Week 12 11/22  PLAN:  PT FREQUENCY: 1-2x/week  PT DURATION: 3 weeks  PLANNED INTERVENTIONS: 97164- PT Re-evaluation, 97110-Therapeutic exercises, 97530- Therapeutic activity, 97112- Neuromuscular re-education, 97535- Self Care, 02859- Manual therapy, [id number]- Gait training, [phone]- Orthotic Fit/training, [phone]- Canalith repositioning, [id number]- Aquatic Therapy, 97760- Splinting, [phone]- Wound care (first 20 sq cm), 97598- Wound care (each additional 20 sq cm)Patient/Family education, Balance training, Stair training, Taping, Dry Needling, Joint mobilization, Joint manipulation, Spinal manipulation, Spinal mobilization, Scar mobilization, and DME instructions.  PLAN FOR NEXT SESSION: f/u with HEP. Hip mobility, manual for pain/mobility, glute/hamstring strength on L.   Lili Finder, Student-PT 11/09/2023, 11:40 AM   This entire session was performed under direct supervision and direction of a licensed therapist/therapist assistant . I have personally read, edited and approve of the note as written.  11:53 AM, 11/09/23 Prentice CANDIE Stains PT, DPT Physical Therapist at [facility]

## 2023-11-13 ENCOUNTER — Encounter (HOSPITAL_BASED_OUTPATIENT_CLINIC_OR_DEPARTMENT_OTHER): Payer: Self-pay | Admitting: Physical Therapy

## 2023-11-13 ENCOUNTER — Ambulatory Visit (HOSPITAL_BASED_OUTPATIENT_CLINIC_OR_DEPARTMENT_OTHER): Payer: PRIVATE HEALTH INSURANCE | Admitting: Physical Therapy

## 2023-11-13 DIAGNOSIS — M6281 Muscle weakness (generalized): Secondary | ICD-10-CM

## 2023-11-13 DIAGNOSIS — M25551 Pain in right hip: Secondary | ICD-10-CM | POA: Diagnosis not present

## 2023-11-13 DIAGNOSIS — R29898 Other symptoms and signs involving the musculoskeletal system: Secondary | ICD-10-CM

## 2023-11-13 DIAGNOSIS — R2689 Other abnormalities of gait and mobility: Secondary | ICD-10-CM

## 2023-11-13 NOTE — Therapy (Addendum)
 OUTPATIENT PHYSICAL THERAPY LOWER EXTREMITY TREATMENT    Patient Name: Carolyn Gutierrez MRN: [id number] DOB:05-01-1955, 68 y.o., female Today's Date: 11/13/2023  END OF SESSION:  PT End of Session - 11/13/23 1434     Visit Number 7    Number of Visits 15    Date for Recertification  01/05/24    Authorization Type Medicare    Progress Note Due on Visit 10    PT Start Time 1434    PT Stop Time 1514    PT Time Calculation (min) 40 min    Activity Tolerance Patient tolerated treatment well    Behavior During Therapy [facility] for tasks assessed/performed          Past Medical History:  Diagnosis Date   Allergy    Anxiety    Aortic atherosclerosis 03/30/2021   Asthma    in the past- no inhaler currently   Depression    Elevated blood pressure reading 03/30/2021   Genital warts    Hay fever    Migraine    Pure hypercholesterolemia 03/30/2021   Stress fracture of right foot    Past Surgical History:  Procedure Laterality Date   ABDOMINAL HYSTERECTOMY  01/17/2012   Procedure: HYSTERECTOMY ABDOMINAL;  Surgeon: Rosaline LITTIE Cobble, MD;  Location: [facility];  Service: Gynecology;  Laterality: N/A;   BREAST SURGERY     breast bx   COLONOSCOPY     11 yr ago with dr coleman ? a few polyps, no report   KNEE SURGERY     right   LAPAROSCOPIC ASSISTED VAGINAL HYSTERECTOMY  01/17/2012   Procedure: LAPAROSCOPIC ASSISTED VAGINAL HYSTERECTOMY;  Surgeon: Rosaline LITTIE Cobble, MD;  Location: [facility];  Service: Gynecology;  Laterality: N/A;  attempted    SALPINGOOPHORECTOMY  01/17/2012   Procedure: SALPINGO OOPHORECTOMY;  Surgeon: Rosaline LITTIE Cobble, MD;  Location: [facility];  Service: Gynecology;  Laterality: Right;   Patient Active Problem List   Diagnosis Date Noted   CAD in native artery 03/30/2021   Elevated blood pressure reading 03/30/2021   Pure hypercholesterolemia 03/30/2021   Aortic atherosclerosis 03/30/2021   Overweight (BMI 25.0-29.9) 08/20/2017   Vertigo 08/15/2017   Pulmonary nodule  04/30/2017   Acute bilateral low back pain without sciatica 04/30/2017   Plantar fasciitis, right 04/09/2017   Osteopenia 07/07/2015   Headache, migraine 03/30/2015   Delusional disorder (HCC) 11/17/2013    PCP: Burney Darice LITTIE, MD   REFERRING PROVIDER: Genelle Standing, MD  REFERRING DIAG:  left hamstring tendinitis; D23.697J (ICD-10-CM) - Left hamstring injury, initial encounter  M25.551 (ICD-10-CM) - Pain of right hip   S/p Rt hip labral repair  THERAPY DIAG:  Pain of right hip  Muscle weakness (generalized)  Other abnormalities of gait and mobility  Other symptoms and signs involving the musculoskeletal system  Rationale for Evaluation and Treatment: Rehabilitation  ONSET DATE: June 2025 DOS 10/04/23  SUBJECTIVE:   SUBJECTIVE STATEMENT: Reports that she started having pain yesterday evening during a walk. Pain was about a 9/10 yesterday morning. Current pain is 3/10. Felt sore after last session but felt good.   PERTINENT HISTORY: Hx osteopenia, hx LBP, HLD  PAIN:  Are you having pain? Yes: NPRS scale: 2/10 Pain location: L glute/SIJ Pain description: sore Aggravating factors: bending Relieving factors: walking  PRECAUTIONS: None  WEIGHT BEARING RESTRICTIONS: No, WBAT post op  FALLS:  Has patient fallen in last 6 months? No    PLOF: Independent  PATIENT GOALS: get rid of hamstring pain.  OBJECTIVE: (objective measures from initial evaluation unless otherwise dated)   Extreme difficulty/unable (0), Quite a bit of difficulty (1), Moderate difficulty (2), Little difficulty (3), No difficulty (4) Survey date:    Any of your usual work, housework or school activities 1  2. Usual hobbies, recreational or sporting activities 0  3. Getting into/out of the bath 1  4. Walking between rooms 3  5. Putting on socks/shoes 1  6. Squatting  0  7. Lifting an object, like a bag of groceries from the floor 0  8. Performing light activities around your home 1   9. Performing heavy activities around your home 0  10. Getting into/out of a car 1  11. Walking 2 blocks 0  12. Walking 1 mile 0  13. Going up/down 10 stairs (1 flight) 0  14. Standing for 1 hour 0  15.  sitting for 1 hour 1  16. Running on even ground 0  17. Running on uneven ground 0  18. Making sharp turns while running fast 0  19. Hopping  0  20. Rolling over in bed 2  Score total:  11/80    POSTURE: No Significant postural limitations  PALPATION: TTP L glute max, piriformis, minimally at L hamstring origin   OBSERVATIONS   L LE significantly longer than R, sx and area of pain consistent with SIJ involvement   LOWER EXTREMITY ROM:   Passive ROM Right 9/4   Hip flexion    Hip extension    Hip abduction    Hip adduction    Hip internal rotation    Hip external rotation    Knee flexion    Knee extension    Ankle dorsiflexion    Ankle plantarflexion    Ankle inversion    Ankle eversion     (Blank rows = not tested) *= pain/symptoms  LOWER EXTREMITY MMT:  MMT Right eval Left eval Right 09/25/23 Left 09/25/23  Hip flexion 5 5    Hip extension 4+ 5 4+ 5  Hip abduction 4+ 5 5 5   Hip adduction      Hip internal rotation      Hip external rotation      Knee flexion 5 5    Knee extension 5 5    Ankle dorsiflexion      Ankle plantarflexion      Ankle inversion      Ankle eversion       (Blank rows = not tested) *= pain/symptoms   GAIT:  9/4 Comments:    TODAY'S TREATMENT:                                                                                                                              DATE:  11/13/23 LF bike lvl 5 x 5 min  Massage gun to R ITB, piriformis, glutes, and quads  Mini squats 3x10 Lateral walk with GTB x6 laps Step ups 6 inch box 2x10  RDL with 3# 2x10  11/09/23 STM to right piriformis, quads, iliopsoas Glute bridges 3x10 Prone hamstring curl 3# 3x10 LAQ 3x10 5#  Sit to stand 3x10 Step ups 4 inch step 2x10 Standing flexed  on elbows- hip hike ball bw kneees Lateral stepping with GTB x5 laps down and back on rail Thomas stretch 3x30 seconds  Active hamstring stretch 3x30 seconds   11/06/23 STM to right piriformis  Glute bridges 3x10 Prone hamstring curls 2.5# 3x10 LAQ 3x10 3.5# Mini squats 2x10 Sit to stand 2x10 Step ups 4 inch step x10 Step taps 4 inch step x10 Supine active hamstring stretch x10 SLS 3x30 seconds   11/01/23 Standing flexed on elbows- hip hike ball bw kneees Sidelying 90/90 feet on wall- hip flexion rolling on ball bw knees- removed ball and added Rt adduction today STM Rt hip external rotators, TFL Standing hip hinge   10/25/23 Qped rocking- hamstring engagement, ab set, neutral pelvic tilt Sidelying 90/90 feet on wall, clam, ball bw ankles- cues for rib cage lift & neutral pelvis to avoid HS overuse Sidelying 90/90 feet on wall- hip flexion rolling on ball bw knees Gait- reduction of ant drift of femoral head in Rt stance phase Seated for car: pelvis twist, isometric add/abd Heel raises with hips in neutral rather than drifting anteriorly   10/18/23 Dressing change Manual: STM hip flexors/quads/TFL on R Bridge 2 x 10 TA activation with bent knee fall outs 2 x 10 Prone hamstring curl 2 x 10 Prone hip ER/IR 2 x 10  Hip flexion stretch with non-surgical leg on table 2x10 with 5 second hold  10/11/23 Changed bandages See HEP Gait training to reduce anterior motion of hip ahead of trunk  PATIENT EDUCATION:  Education details: Anatomy of condition, POC, HEP, exercise form/rationale  Person educated: Patient Education method: Explanation, Demonstration, and Handouts Education comprehension: verbalized understanding, returned demonstration, verbal cues required, and tactile cues required  HOME EXERCISE PROGRAM: Prehab- Access Code: [email or web address] URL: [email or web address]  Access Code: [email or web address] URL: [email or web address] Date: 10/11/2023 Prepared  by: Harlene Cordon  Exercises - Lying Prone  - 3-4 x daily - 7 x weekly - 2-3 min hold - Prone Gluteal Sets  - 3-4 x daily - 7 x weekly - 1 sets - 10 reps - 3s hold - Prone Knee Flexion  - 3-4 x daily - 7 x weekly - 1 sets - 10 reps - Seated Hamstring Stretch  - 2-3 x daily - 7 x weekly - 2 sets - 5 breaths hold - Standing Heel Raise  - 1 x daily - 7 x weekly - 2-3 sets - 1 reps  ASSESSMENT:  CLINICAL IMPRESSION: Patient tolerated new exercises well with no significant increase in pain. Manual therapy utilized to decrease pain and stiffness in R ITB, piriformis, glutes, and quads. Responded well to manual therapy with a decrease in pain. Exercises focused on LE strengthening to improve activity tolerance. Verbal cueing for body mechanics throughout to increase glute activation on RLE. Will continue to benefit from therapy to address remaining limitations and return to prior level of function.   OBJECTIVE IMPAIRMENTS: decreased activity tolerance, decreased balance, decreased endurance, decreased mobility, difficulty walking, decreased ROM, decreased strength, increased muscle spasms, impaired flexibility, improper body mechanics, and pain  ACTIVITY LIMITATIONS: lifting, bending, sitting, standing, squatting, stairs, transfers, locomotion level, and caring for others  PARTICIPATION LIMITATIONS: meal prep, cleaning, laundry, shopping, community activity, occupation, and yard work  PERSONAL FACTORS: 3+ comorbidities: Hx osteopenia, hx LBP, HLD, R hip  pain are also affecting patient's functional outcome.   REHAB POTENTIAL: Good  CLINICAL DECISION MAKING: Evolving/moderate complexity  EVALUATION COMPLEXITY: Moderate   GOALS: Goals reviewed with patient? Yes  SHORT TERM GOALS: Target date: 4 weeks 9/27  Pain free flexion to 90 Baseline: Goal status: INITIAL   2.  Demo controlled quad set with hold Baseline:  Goal status: INITIAL   3.  30s sit to stand without pain or  compensation Baseline:  Goal status: INITIAL   4.  SLS 30s without compensation Baseline:  Goal status: INITIAL    LONG TERM GOALS:  Able to demo step up on 6 step with level pelvis and proper form Baseline:  Goal status: INITIAL Week 8 10/25  2.  Will tolerate at least 3 min on elliptical, demonstrating good tolerance to repetitive weight bearing motion Baseline:  Goal status: INITIAL  Week 8 10/25  3.  Demonstrate proper form in at least 10 continuous lunges without increased pain Baseline:  Goal status: INITIAL  Week 12 11/22  4.  Demonstrate ability to get up and down from the floor without limitation by hip pain Baseline:  Goal status: INITIAL Week 12 11/22  PLAN:  PT FREQUENCY: 1-2x/week  PT DURATION: 3 weeks  PLANNED INTERVENTIONS: 97164- PT Re-evaluation, 97110-Therapeutic exercises, 97530- Therapeutic activity, 97112- Neuromuscular re-education, 97535- Self Care, [id number]- Manual therapy, [id number]- Gait training, [phone]- Orthotic Fit/training, [id number]- Canalith repositioning, [id number]- Aquatic Therapy, 97760- Splinting, [phone]- Wound care (first 20 sq cm), 97598- Wound care (each additional 20 sq cm)Patient/Family education, Balance training, Stair training, Taping, Dry Needling, Joint mobilization, Joint manipulation, Spinal manipulation, Spinal mobilization, Scar mobilization, and DME instructions.  PLAN FOR NEXT SESSION: f/u with HEP. Hip mobility, manual for pain/mobility, glute/hamstring strength on L.   Lili Finder, Student-PT 11/13/2023, 3:15 PM   This entire session was performed under direct supervision and direction of a licensed therapist/therapist assistant . I have personally read, edited and approve of the note as written.  3:41 PM, 11/13/23 Prentice CANDIE Stains PT, DPT Physical Therapist at [facility]

## 2023-11-15 ENCOUNTER — Encounter (HOSPITAL_BASED_OUTPATIENT_CLINIC_OR_DEPARTMENT_OTHER): Payer: Self-pay | Admitting: Physical Therapy

## 2023-11-15 ENCOUNTER — Ambulatory Visit (HOSPITAL_BASED_OUTPATIENT_CLINIC_OR_DEPARTMENT_OTHER): Payer: PRIVATE HEALTH INSURANCE | Admitting: Physical Therapy

## 2023-11-15 DIAGNOSIS — M25551 Pain in right hip: Secondary | ICD-10-CM

## 2023-11-15 DIAGNOSIS — R29898 Other symptoms and signs involving the musculoskeletal system: Secondary | ICD-10-CM

## 2023-11-15 DIAGNOSIS — M6281 Muscle weakness (generalized): Secondary | ICD-10-CM

## 2023-11-15 DIAGNOSIS — R2689 Other abnormalities of gait and mobility: Secondary | ICD-10-CM

## 2023-11-15 NOTE — Therapy (Addendum)
 OUTPATIENT PHYSICAL THERAPY LOWER EXTREMITY TREATMENT    Patient Name: Carolyn Gutierrez MRN: [id number] DOB:[birth year], 68 y.o., female Today's Date: 11/15/2023  END OF SESSION:  PT End of Session - 11/15/23 1433     Visit Number 8    Number of Visits 15    Date for Recertification  01/05/24    Authorization Type Medicare    Progress Note Due on Visit 10    PT Start Time 1434    PT Stop Time 1514    PT Time Calculation (min) 40 min    Activity Tolerance Patient tolerated treatment well    Behavior During Therapy [facility] for tasks assessed/performed          Past Medical History:  Diagnosis Date   Allergy    Anxiety    Aortic atherosclerosis 03/30/2021   Asthma    in the past- no inhaler currently   Depression    Elevated blood pressure reading 03/30/2021   Genital warts    Hay fever    Migraine    Pure hypercholesterolemia 03/30/2021   Stress fracture of right foot    Past Surgical History:  Procedure Laterality Date   ABDOMINAL HYSTERECTOMY  01/17/2012   Procedure: HYSTERECTOMY ABDOMINAL;  Surgeon: Rosaline LITTIE Cobble, MD;  Location: [facility];  Service: Gynecology;  Laterality: N/A;   BREAST SURGERY     breast bx   COLONOSCOPY     11 yr ago with dr coleman ? a few polyps, no report   KNEE SURGERY     right   LAPAROSCOPIC ASSISTED VAGINAL HYSTERECTOMY  01/17/2012   Procedure: LAPAROSCOPIC ASSISTED VAGINAL HYSTERECTOMY;  Surgeon: Rosaline LITTIE Cobble, MD;  Location: [facility];  Service: Gynecology;  Laterality: N/A;  attempted    SALPINGOOPHORECTOMY  01/17/2012   Procedure: SALPINGO OOPHORECTOMY;  Surgeon: Rosaline LITTIE Cobble, MD;  Location: [facility];  Service: Gynecology;  Laterality: Right;   Patient Active Problem List   Diagnosis Date Noted   CAD in native artery 03/30/2021   Elevated blood pressure reading 03/30/2021   Pure hypercholesterolemia 03/30/2021   Aortic atherosclerosis 03/30/2021   Overweight (BMI 25.0-29.9) 08/20/2017   Vertigo 08/15/2017   Pulmonary nodule  04/30/2017   Acute bilateral low back pain without sciatica 04/30/2017   Plantar fasciitis, right 04/09/2017   Osteopenia 07/07/2015   Headache, migraine 03/30/2015   Delusional disorder (HCC) 11/17/2013    PCP: Burney Darice LITTIE, MD   REFERRING PROVIDER: Genelle Standing, MD  REFERRING DIAG:  left hamstring tendinitis; D23.697J (ICD-10-CM) - Left hamstring injury, initial encounter  M25.551 (ICD-10-CM) - Pain of right hip   S/p Rt hip labral repair  THERAPY DIAG:  Pain of right hip  Muscle weakness (generalized)  Other abnormalities of gait and mobility  Other symptoms and signs involving the musculoskeletal system  Rationale for Evaluation and Treatment: Rehabilitation  ONSET DATE: June 2025 DOS 10/04/23  SUBJECTIVE:   SUBJECTIVE STATEMENT: Reports that feels a little tightness in right ITB region but no pain otherwise. A little sore after last session.   PERTINENT HISTORY: Hx osteopenia, hx LBP, HLD  PAIN:  Are you having pain? Yes: NPRS scale: 2/10 Pain location: L glute/SIJ Pain description: sore Aggravating factors: bending Relieving factors: walking  PRECAUTIONS: None  WEIGHT BEARING RESTRICTIONS: No, WBAT post op  FALLS:  Has patient fallen in last 6 months? No    PLOF: Independent  PATIENT GOALS: get rid of hamstring pain.   OBJECTIVE: (objective measures from initial evaluation unless otherwise  dated)   Extreme difficulty/unable (0), Quite a bit of difficulty (1), Moderate difficulty (2), Little difficulty (3), No difficulty (4) Survey date:    Any of your usual work, housework or school activities 1  2. Usual hobbies, recreational or sporting activities 0  3. Getting into/out of the bath 1  4. Walking between rooms 3  5. Putting on socks/shoes 1  6. Squatting  0  7. Lifting an object, like a bag of groceries from the floor 0  8. Performing light activities around your home 1  9. Performing heavy activities around your home 0  10.  Getting into/out of a car 1  11. Walking 2 blocks 0  12. Walking 1 mile 0  13. Going up/down 10 stairs (1 flight) 0  14. Standing for 1 hour 0  15.  sitting for 1 hour 1  16. Running on even ground 0  17. Running on uneven ground 0  18. Making sharp turns while running fast 0  19. Hopping  0  20. Rolling over in bed 2  Score total:  11/80    POSTURE: No Significant postural limitations  PALPATION: TTP L glute max, piriformis, minimally at L hamstring origin   OBSERVATIONS   L LE significantly longer than R, sx and area of pain consistent with SIJ involvement   LOWER EXTREMITY ROM:   Passive ROM Right 9/4   Hip flexion    Hip extension    Hip abduction    Hip adduction    Hip internal rotation    Hip external rotation    Knee flexion    Knee extension    Ankle dorsiflexion    Ankle plantarflexion    Ankle inversion    Ankle eversion     (Blank rows = not tested) *= pain/symptoms  LOWER EXTREMITY MMT:  MMT Right eval Left eval Right 09/25/23 Left 09/25/23  Hip flexion 5 5    Hip extension 4+ 5 4+ 5  Hip abduction 4+ 5 5 5   Hip adduction      Hip internal rotation      Hip external rotation      Knee flexion 5 5    Knee extension 5 5    Ankle dorsiflexion      Ankle plantarflexion      Ankle inversion      Ankle eversion       (Blank rows = not tested) *= pain/symptoms   GAIT:  9/4 Comments:    TODAY'S TREATMENT:                                                                                                                              DATE:  11/15/23 Elliptical x6 min  Lunges 2x6 bilaterally  Step ups 8 inch box 3x10 Squats 2x10 with no hand assistance RDL with 5# weight in both hands   Active hamstring stretch 2x10 with 5 second holds  Education on safe activity/exercise  11/13/23 LF bike lvl 5 x 5 min  Massage gun to R ITB, piriformis, glutes, and quads  Mini squats 3x10 Lateral walk with GTB x6 laps Step ups 6 inch box 2x10  RDL with 3#  2x10  11/09/23 STM to right piriformis, quads, iliopsoas Glute bridges 3x10 Prone hamstring curl 3# 3x10 LAQ 3x10 5#  Sit to stand 3x10 Step ups 4 inch step 2x10 Standing flexed on elbows- hip hike ball bw kneees Lateral stepping with GTB x5 laps down and back on rail Thomas stretch 3x30 seconds  Active hamstring stretch 3x30 seconds   11/06/23 STM to right piriformis  Glute bridges 3x10 Prone hamstring curls 2.5# 3x10 LAQ 3x10 3.5# Mini squats 2x10 Sit to stand 2x10 Step ups 4 inch step x10 Step taps 4 inch step x10 Supine active hamstring stretch x10 SLS 3x30 seconds   11/01/23 Standing flexed on elbows- hip hike ball bw kneees Sidelying 90/90 feet on wall- hip flexion rolling on ball bw knees- removed ball and added Rt adduction today STM Rt hip external rotators, TFL Standing hip hinge   10/25/23 Qped rocking- hamstring engagement, ab set, neutral pelvic tilt Sidelying 90/90 feet on wall, clam, ball bw ankles- cues for rib cage lift & neutral pelvis to avoid HS overuse Sidelying 90/90 feet on wall- hip flexion rolling on ball bw knees Gait- reduction of ant drift of femoral head in Rt stance phase Seated for car: pelvis twist, isometric add/abd Heel raises with hips in neutral rather than drifting anteriorly   10/18/23 Dressing change Manual: STM hip flexors/quads/TFL on R Bridge 2 x 10 TA activation with bent knee fall outs 2 x 10 Prone hamstring curl 2 x 10 Prone hip ER/IR 2 x 10  Hip flexion stretch with non-surgical leg on table 2x10 with 5 second hold  10/11/23 Changed bandages See HEP Gait training to reduce anterior motion of hip ahead of trunk  PATIENT EDUCATION:  Education details: Anatomy of condition, POC, HEP, exercise form/rationale 11/15/23: education on proper/safe activity  Person educated: Patient Education method: Programmer, multimedia, Demonstration, and Handouts Education comprehension: verbalized understanding, returned demonstration, verbal cues  required, and tactile cues required  HOME EXERCISE PROGRAM: Prehab- Access Code: [email or web address] URL: [email or web address]  Access Code: [email or web address] URL: [email or web address] Date: 10/11/2023 Prepared by: Harlene Cordon  Exercises - Lying Prone  - 3-4 x daily - 7 x weekly - 2-3 min hold - Prone Gluteal Sets  - 3-4 x daily - 7 x weekly - 1 sets - 10 reps - 3s hold - Prone Knee Flexion  - 3-4 x daily - 7 x weekly - 1 sets - 10 reps - Seated Hamstring Stretch  - 2-3 x daily - 7 x weekly - 2 sets - 5 breaths hold - Standing Heel Raise  - 1 x daily - 7 x weekly - 2-3 sets - 1 reps  ASSESSMENT:  CLINICAL IMPRESSION: Patient tolerated new exercises well with no significant increase in pain. Exercises focused on loading the glutes/quads in closed chain to improve strength. Patient and PT discussed activities the patient could return to within tolerance for pain. Patient verbalized understanding and wishes to return to yoga/pilates. Educated patient on expected soreness. Patient will benefit from continued therapy to address remaining limitations and return to prior level of function.   OBJECTIVE IMPAIRMENTS: decreased activity tolerance, decreased balance, decreased endurance, decreased mobility, difficulty walking, decreased ROM, decreased strength, increased muscle spasms, impaired flexibility, improper body mechanics, and pain  ACTIVITY LIMITATIONS: lifting, bending, sitting, standing, squatting,  stairs, transfers, locomotion level, and caring for others  PARTICIPATION LIMITATIONS: meal prep, cleaning, laundry, shopping, community activity, occupation, and yard work  PERSONAL FACTORS: 3+ comorbidities: Hx osteopenia, hx LBP, HLD, R hip pain are also affecting patient's functional outcome.   REHAB POTENTIAL: Good  CLINICAL DECISION MAKING: Evolving/moderate complexity  EVALUATION COMPLEXITY: Moderate   GOALS: Goals reviewed with patient? Yes  SHORT TERM  GOALS: Target date: 4 weeks 9/27  Pain free flexion to 90 Baseline: Goal status: INITIAL   2.  Demo controlled quad set with hold Baseline:  Goal status: INITIAL   3.  30s sit to stand without pain or compensation Baseline:  Goal status: INITIAL   4.  SLS 30s without compensation Baseline:  Goal status: INITIAL    LONG TERM GOALS:  Able to demo step up on 6 step with level pelvis and proper form Baseline:  Goal status: INITIAL Week 8 10/25  2.  Will tolerate at least 3 min on elliptical, demonstrating good tolerance to repetitive weight bearing motion Baseline:  Goal status: INITIAL  Week 8 10/25  3.  Demonstrate proper form in at least 10 continuous lunges without increased pain Baseline:  Goal status: INITIAL  Week 12 11/22  4.  Demonstrate ability to get up and down from the floor without limitation by hip pain Baseline:  Goal status: INITIAL Week 12 11/22  PLAN:  PT FREQUENCY: 1-2x/week  PT DURATION: 3 weeks  PLANNED INTERVENTIONS: 97164- PT Re-evaluation, 97110-Therapeutic exercises, 97530- Therapeutic activity, 97112- Neuromuscular re-education, 97535- Self Care, 02859- Manual therapy, [id number]- Gait training, [phone]- Orthotic Fit/training, [phone]- Canalith repositioning, [id number]- Aquatic Therapy, 97760- Splinting, [phone]- Wound care (first 20 sq cm), 97598- Wound care (each additional 20 sq cm)Patient/Family education, Balance training, Stair training, Taping, Dry Needling, Joint mobilization, Joint manipulation, Spinal manipulation, Spinal mobilization, Scar mobilization, and DME instructions.  PLAN FOR NEXT SESSION: f/u with HEP. Hip mobility, manual for pain/mobility, glute/hamstring strength on L.   Lili Finder, Student-PT 11/15/2023, 2:34 PM  This entire session was performed under direct supervision and direction of a licensed therapist/therapist assistant . I have personally read, edited and approve of the note as written.  3:25 PM, 11/15/23 Prentice CANDIE Stains PT, DPT Physical Therapist at [facility]

## 2023-11-19 ENCOUNTER — Encounter (HOSPITAL_BASED_OUTPATIENT_CLINIC_OR_DEPARTMENT_OTHER): Payer: Self-pay | Admitting: Orthopaedic Surgery

## 2023-11-20 ENCOUNTER — Encounter (HOSPITAL_BASED_OUTPATIENT_CLINIC_OR_DEPARTMENT_OTHER): Payer: Self-pay | Admitting: Physical Therapy

## 2023-11-20 ENCOUNTER — Ambulatory Visit (HOSPITAL_BASED_OUTPATIENT_CLINIC_OR_DEPARTMENT_OTHER): Payer: PRIVATE HEALTH INSURANCE | Admitting: Physical Therapy

## 2023-11-20 DIAGNOSIS — M6281 Muscle weakness (generalized): Secondary | ICD-10-CM

## 2023-11-20 DIAGNOSIS — R29898 Other symptoms and signs involving the musculoskeletal system: Secondary | ICD-10-CM

## 2023-11-20 DIAGNOSIS — R2689 Other abnormalities of gait and mobility: Secondary | ICD-10-CM

## 2023-11-20 DIAGNOSIS — M25551 Pain in right hip: Secondary | ICD-10-CM | POA: Diagnosis not present

## 2023-11-20 NOTE — Therapy (Addendum)
 OUTPATIENT PHYSICAL THERAPY LOWER EXTREMITY TREATMENT    Patient Name: Carolyn Gutierrez MRN: [id number] DOB:07/26/1955, 68 y.o., female Today's Date: 11/20/2023  END OF SESSION:  PT End of Session - 11/20/23 0719     Visit Number 9    Number of Visits 15    Date for Recertification  01/05/24     Authorization Type Medicare    Progress Note Due on Visit 10    PT Start Time 0717    PT Stop Time 0757    PT Time Calculation (min) 40 min    Activity Tolerance Patient tolerated treatment well    Behavior During Therapy [facility] for tasks assessed/performed          Past Medical History:  Diagnosis Date   Allergy    Anxiety    Aortic atherosclerosis 03/30/2021   Asthma    in the past- no inhaler currently   Depression    Elevated blood pressure reading 03/30/2021   Genital warts    Hay fever    Migraine    Pure hypercholesterolemia 03/30/2021   Stress fracture of right foot    Past Surgical History:  Procedure Laterality Date   ABDOMINAL HYSTERECTOMY  01/17/2012   Procedure: HYSTERECTOMY ABDOMINAL;  Surgeon: Rosaline LITTIE Cobble, MD;  Location: [facility];  Service: Gynecology;  Laterality: N/A;   BREAST SURGERY     breast bx   COLONOSCOPY     11 yr ago with dr coleman ? a few polyps, no report   KNEE SURGERY     right   LAPAROSCOPIC ASSISTED VAGINAL HYSTERECTOMY  01/17/2012   Procedure: LAPAROSCOPIC ASSISTED VAGINAL HYSTERECTOMY;  Surgeon: Rosaline LITTIE Cobble, MD;  Location: [facility];  Service: Gynecology;  Laterality: N/A;  attempted    SALPINGOOPHORECTOMY  01/17/2012   Procedure: SALPINGO OOPHORECTOMY;  Surgeon: Rosaline LITTIE Cobble, MD;  Location: [facility];  Service: Gynecology;  Laterality: Right;   Patient Active Problem List   Diagnosis Date Noted   CAD in native artery 03/30/2021   Elevated blood pressure reading 03/30/2021   Pure hypercholesterolemia 03/30/2021   Aortic atherosclerosis 03/30/2021   Overweight (BMI 25.0-29.9) 08/20/2017   Vertigo 08/15/2017   Pulmonary  nodule 04/30/2017   Acute bilateral low back pain without sciatica 04/30/2017   Plantar fasciitis, right 04/09/2017   Osteopenia 07/07/2015   Headache, migraine 03/30/2015   Delusional disorder (HCC) 11/17/2013    PCP: Burney Darice LITTIE, MD   REFERRING PROVIDER: Genelle Standing, MD  REFERRING DIAG:  left hamstring tendinitis; D23.697J (ICD-10-CM) - Left hamstring injury, initial encounter  M25.551 (ICD-10-CM) - Pain of right hip   S/p Rt hip labral repair  THERAPY DIAG:  Pain of right hip  Muscle weakness (generalized)  Other abnormalities of gait and mobility  Other symptoms and signs involving the musculoskeletal system  Rationale for Evaluation and Treatment: Rehabilitation  ONSET DATE: June 2025 DOS 10/04/23  SUBJECTIVE:   SUBJECTIVE STATEMENT: Reports that feels a little tightness/pain in right hip when walking. Feels limited with rotation hip in and out because she is worried it will hurt.   PERTINENT HISTORY: Hx osteopenia, hx LBP, HLD  PAIN:  Are you having pain? Yes: NPRS scale: 2/10 Pain location: L glute/SIJ Pain description: sore Aggravating factors: bending Relieving factors: walking  PRECAUTIONS: None  WEIGHT BEARING RESTRICTIONS: No, WBAT post op  FALLS:  Has patient fallen in last 6 months? No    PLOF: Independent  PATIENT GOALS: get rid of hamstring pain.   OBJECTIVE: (  objective measures from initial evaluation unless otherwise dated)   Extreme difficulty/unable (0), Quite a bit of difficulty (1), Moderate difficulty (2), Little difficulty (3), No difficulty (4) Survey date:    Any of your usual work, housework or school activities 1  2. Usual hobbies, recreational or sporting activities 0  3. Getting into/out of the bath 1  4. Walking between rooms 3  5. Putting on socks/shoes 1  6. Squatting  0  7. Lifting an object, like a bag of groceries from the floor 0  8. Performing light activities around your home 1  9. Performing heavy  activities around your home 0  10. Getting into/out of a car 1  11. Walking 2 blocks 0  12. Walking 1 mile 0  13. Going up/down 10 stairs (1 flight) 0  14. Standing for 1 hour 0  15.  sitting for 1 hour 1  16. Running on even ground 0  17. Running on uneven ground 0  18. Making sharp turns while running fast 0  19. Hopping  0  20. Rolling over in bed 2  Score total:  11/80    POSTURE: No Significant postural limitations  PALPATION: TTP L glute max, piriformis, minimally at L hamstring origin   OBSERVATIONS   L LE significantly longer than R, sx and area of pain consistent with SIJ involvement   LOWER EXTREMITY ROM:   Passive ROM Right 9/4   Hip flexion    Hip extension    Hip abduction    Hip adduction    Hip internal rotation    Hip external rotation    Knee flexion    Knee extension    Ankle dorsiflexion    Ankle plantarflexion    Ankle inversion    Ankle eversion     (Blank rows = not tested) *= pain/symptoms  LOWER EXTREMITY MMT:  MMT Right eval Left eval Right 09/25/23 Left 09/25/23  Hip flexion 5 5    Hip extension 4+ 5 4+ 5  Hip abduction 4+ 5 5 5   Hip adduction      Hip internal rotation      Hip external rotation      Knee flexion 5 5    Knee extension 5 5    Ankle dorsiflexion      Ankle plantarflexion      Ankle inversion      Ankle eversion       (Blank rows = not tested) *= pain/symptoms   GAIT:  9/4 Comments:    TODAY'S TREATMENT:                                                                                                                              DATE:  11/20/23 Elliptical 7 min lvl 4  LF leg press x10 at 25#, 2x10 at 40# (seat lvl 8) Hip abduction machine 30# 3x10 Hip adduction machine 55# 3x10 Leg extension machine 3x10 25# (seat lvl  3, knee position 11, ankle position L) Quadruped rocking stretch 2x10 Lunges 3x10 B BKFO stretch 3x10  11/15/23 Elliptical x6 min  Lunges 2x6 bilaterally  Step ups 8 inch box  3x10 Squats 2x10 with no hand assistance RDL with 5# weight in both hands   Active hamstring stretch 2x10 with 5 second holds  Education on safe activity/exercise  11/13/23 LF bike lvl 5 x 5 min  Massage gun to R ITB, piriformis, glutes, and quads  Mini squats 3x10 Lateral walk with GTB x6 laps Step ups 6 inch box 2x10  RDL with 3# 2x10  11/09/23 STM to right piriformis, quads, iliopsoas Glute bridges 3x10 Prone hamstring curl 3# 3x10 LAQ 3x10 5#  Sit to stand 3x10 Step ups 4 inch step 2x10 Standing flexed on elbows- hip hike ball bw kneees Lateral stepping with GTB x5 laps down and back on rail Thomas stretch 3x30 seconds  Active hamstring stretch 3x30 seconds   11/06/23 STM to right piriformis  Glute bridges 3x10 Prone hamstring curls 2.5# 3x10 LAQ 3x10 3.5# Mini squats 2x10 Sit to stand 2x10 Step ups 4 inch step x10 Step taps 4 inch step x10 Supine active hamstring stretch x10 SLS 3x30 seconds   11/01/23 Standing flexed on elbows- hip hike ball bw kneees Sidelying 90/90 feet on wall- hip flexion rolling on ball bw knees- removed ball and added Rt adduction today STM Rt hip external rotators, TFL Standing hip hinge  10/25/23 Qped rocking- hamstring engagement, ab set, neutral pelvic tilt Sidelying 90/90 feet on wall, clam, ball bw ankles- cues for rib cage lift & neutral pelvis to avoid HS overuse Sidelying 90/90 feet on wall- hip flexion rolling on ball bw knees Gait- reduction of ant drift of femoral head in Rt stance phase Seated for car: pelvis twist, isometric add/abd Heel raises with hips in neutral rather than drifting anteriorly  10/18/23 Dressing change Manual: STM hip flexors/quads/TFL on R Bridge 2 x 10 TA activation with bent knee fall outs 2 x 10 Prone hamstring curl 2 x 10 Prone hip ER/IR 2 x 10  Hip flexion stretch with non-surgical leg on table 2x10 with 5 second hold  10/11/23 Changed bandages See HEP Gait training to reduce anterior motion  of hip ahead of trunk  PATIENT EDUCATION:  Education details: Anatomy of condition, POC, HEP, exercise form/rationale 11/15/23: education on proper/safe activity 11/20/23: education on relevant anatomy, gym routine, and activity tolerance   Person educated: Patient Education method: Programmer, multimedia, Demonstration, and Handouts Education comprehension: verbalized understanding, returned demonstration, verbal cues required, and tactile cues required  HOME EXERCISE PROGRAM: Prehab- Access Code: [email or web address] URL: [email or web address]  Access Code: [email or web address] URL: [email or web address] Date: 10/11/2023 Prepared by: Harlene Cordon  Exercises - Lying Prone  - 3-4 x daily - 7 x weekly - 2-3 min hold - Prone Gluteal Sets  - 3-4 x daily - 7 x weekly - 1 sets - 10 reps - 3s hold - Prone Knee Flexion  - 3-4 x daily - 7 x weekly - 1 sets - 10 reps - Seated Hamstring Stretch  - 2-3 x daily - 7 x weekly - 2 sets - 5 breaths hold - Standing Heel Raise  - 1 x daily - 7 x weekly - 2-3 sets - 1 reps  ASSESSMENT:  CLINICAL IMPRESSION: Patient tolerated new exercises well with no significant increase in pain. Exercises focused on loading the glutes/quads in closed chain to improve strength and progressing to machine weights in the gym to aid in  building gym routine. Stretching utilized to target tight musculature in glutes, quads, and adductors. Patient educated on using machine weights and advised to begin coming into the gym on her own. Updated HEP and educated on activity tolerance and expected soreness. Will continue to benefit from physical therapy to address remaining limitations and return to prior level of function.   OBJECTIVE IMPAIRMENTS: decreased activity tolerance, decreased balance, decreased endurance, decreased mobility, difficulty walking, decreased ROM, decreased strength, increased muscle spasms, impaired flexibility, improper body mechanics, and pain  ACTIVITY  LIMITATIONS: lifting, bending, sitting, standing, squatting, stairs, transfers, locomotion level, and caring for others  PARTICIPATION LIMITATIONS: meal prep, cleaning, laundry, shopping, community activity, occupation, and yard work  PERSONAL FACTORS: 3+ comorbidities: Hx osteopenia, hx LBP, HLD, R hip pain are also affecting patient's functional outcome.   REHAB POTENTIAL: Good  CLINICAL DECISION MAKING: Evolving/moderate complexity  EVALUATION COMPLEXITY: Moderate   GOALS: Goals reviewed with patient? Yes  SHORT TERM GOALS: Target date: 4 weeks 9/27  Pain free flexion to 90 Baseline: Goal status: INITIAL   2.  Demo controlled quad set with hold Baseline:  Goal status: INITIAL   3.  30s sit to stand without pain or compensation Baseline:  Goal status: INITIAL   4.  SLS 30s without compensation Baseline:  Goal status: INITIAL    LONG TERM GOALS:  Able to demo step up on 6 step with level pelvis and proper form Baseline:  Goal status: INITIAL Week 8 10/25  2.  Will tolerate at least 3 min on elliptical, demonstrating good tolerance to repetitive weight bearing motion Baseline:  Goal status: INITIAL  Week 8 10/25  3.  Demonstrate proper form in at least 10 continuous lunges without increased pain Baseline:  Goal status: INITIAL  Week 12 11/22  4.  Demonstrate ability to get up and down from the floor without limitation by hip pain Baseline:  Goal status: INITIAL Week 12 11/22  PLAN:  PT FREQUENCY: 1-2x/week  PT DURATION: 3 weeks  PLANNED INTERVENTIONS: 97164- PT Re-evaluation, 97110-Therapeutic exercises, 97530- Therapeutic activity, 97112- Neuromuscular re-education, 97535- Self Care, [id number]- Manual therapy, [id number]- Gait training, [phone]- Orthotic Fit/training, [phone]- Canalith repositioning, [id number]- Aquatic Therapy, 97760- Splinting, [phone]- Wound care (first 20 sq cm), 97598- Wound care (each additional 20 sq cm)Patient/Family education, Balance training, Stair  training, Taping, Dry Needling, Joint mobilization, Joint manipulation, Spinal manipulation, Spinal mobilization, Scar mobilization, and DME instructions.  PLAN FOR NEXT SESSION: f/u with HEP. Hip mobility, manual for pain/mobility, glute/hamstring strength on L.   Lili Finder, Student-PT 11/20/2023, 7:57 AM  This entire session was performed under direct supervision and direction of a licensed therapist/therapist assistant . I have personally read, edited and approve of the note as written.  8:05 AM, 11/20/23 Prentice CANDIE Stains PT, DPT Physical Therapist at [facility]

## 2023-11-21 ENCOUNTER — Ambulatory Visit (INDEPENDENT_AMBULATORY_CARE_PROVIDER_SITE_OTHER): Payer: PRIVATE HEALTH INSURANCE | Admitting: Orthopaedic Surgery

## 2023-11-21 DIAGNOSIS — M25551 Pain in right hip: Secondary | ICD-10-CM

## 2023-11-21 NOTE — Progress Notes (Signed)
 Post Operative Evaluation    Procedure/Date of Surgery: Right labral repair 8/28  Interval History:   Presents today 6 weeks status post the above procedure.  Overall she is doing very well.  Denies any pain in the hip.  Overall she is doing extremely well   PMH/PSH/Family History/Social History/Meds/Allergies:    Past Medical History:  Diagnosis Date   Allergy    Anxiety    Aortic atherosclerosis 03/30/2021   Asthma    in the past- no inhaler currently   Depression    Elevated blood pressure reading 03/30/2021   Genital warts    Hay fever    Migraine    Pure hypercholesterolemia 03/30/2021   Stress fracture of right foot    Past Surgical History:  Procedure Laterality Date   ABDOMINAL HYSTERECTOMY  01/17/2012   Procedure: HYSTERECTOMY ABDOMINAL;  Surgeon: Rosaline LITTIE Cobble, MD;  Location: [facility];  Service: Gynecology;  Laterality: N/A;   BREAST SURGERY     breast bx   COLONOSCOPY     11 yr ago with dr coleman ? a few polyps, no report   KNEE SURGERY     right   LAPAROSCOPIC ASSISTED VAGINAL HYSTERECTOMY  01/17/2012   Procedure: LAPAROSCOPIC ASSISTED VAGINAL HYSTERECTOMY;  Surgeon: Rosaline LITTIE Cobble, MD;  Location: [facility];  Service: Gynecology;  Laterality: N/A;  attempted    SALPINGOOPHORECTOMY  01/17/2012   Procedure: SALPINGO OOPHORECTOMY;  Surgeon: Rosaline LITTIE Cobble, MD;  Location: [facility];  Service: Gynecology;  Laterality: Right;   Social History   Socioeconomic History   Marital status: Married    Spouse name: Not on file   Number of children: Not on file   Years of education: Not on file   Highest education level: Not on file  Occupational History   Not on file  Tobacco Use   Smoking status: Never   Smokeless tobacco: Never  Vaping Use   Vaping status: Never Used  Substance and Sexual Activity   Alcohol use: Yes    Alcohol/week: 0.0 standard drinks of alcohol    Comment: Last drink was over a week ago.    Drug use:  No   Sexual activity: Never  Other Topics Concern   Not on file  Social History Narrative   Married.    Bachelor's of arts degree Pensions consultant    Wears a bicycle helmet, smoke alarm and home, wears her seatbelt.   Vegetarian, consumes dairy products.   Feels safe in her relationships.   Social Drivers of Corporate investment banker Strain: Low Risk  (03/30/2021)   Overall Financial Resource Strain (CARDIA)    Difficulty of Paying Living Expenses: Not hard at all  Food Insecurity: No Food Insecurity (03/30/2021)   Hunger Vital Sign    Worried About Running Out of Food in the Last Year: Never true    Ran Out of Food in the Last Year: Never true  Transportation Needs: No Transportation Needs (03/30/2021)   PRAPARE - Administrator, Civil Service (Medical): No    Lack of Transportation (Non-Medical): No  Physical Activity: Sufficiently Active (03/30/2021)   Exercise Vital Sign    Days of Exercise per Week: 7 days    Minutes of Exercise per Session: 30 min  Stress: Not on file  Social  Connections: Unknown (08/23/2022)   Received from [facility]   Social Network    Social Network: Not on file   Family History  Problem Relation Age of Onset   Cancer Mother        pancreatic   Pancreatic cancer Mother    Depression Mother    Cancer Father        basal cell, bladder   Heart disease Father    Cancer Sister        melanoma   Arthritis Sister    Asthma Sister    Diabetes Sister    Hyperlipidemia Sister    Hypertension Sister    Kidney disease Sister    Diabetes Sister    Hypertension Sister    Arthritis Sister    Heart attack Maternal Uncle    Heart attack Paternal Uncle    Stroke Paternal Grandmother    Colon cancer Neg Hx    Colon polyps Neg Hx    Esophageal cancer Neg Hx    Rectal cancer Neg Hx    Stomach cancer Neg Hx    No Known Allergies Current Outpatient Medications  Medication Sig Dispense Refill   acetaminophen  (TYLENOL ) 500 MG  tablet Take 1 tablet (500 mg total) by mouth every 6 (six) hours as needed for mild pain (pain score 1-3). 30 tablet 0   aspirin  EC 325 MG tablet Take 1 tablet (325 mg total) by mouth daily. 14 tablet 0   Coenzyme Q10 (CO Q 10) 100 MG CAPS Take 1 capsule by mouth daily at 12 noon.     fexofenadine-pseudoephedrine (ALLEGRA-D) 60-120 MG 12 hr tablet Take 1 tablet by mouth as needed.     ibuprofen  (ADVIL ) 800 MG tablet Take 1 tablet (800 mg total) by mouth every 8 (eight) hours as needed. 30 tablet 0   Multiple Vitamin (MULTIVITAMIN WITH MINERALS) TABS Take 1 tablet by mouth daily.     Omega 3-5-6-7-9 Fatty Acids (COMPLETE OMEGA) CAPS Take 1 capsule by mouth daily.     oxycodone  (OXY-IR) 5 MG capsule Take 1 capsule (5 mg total) by mouth every 4 (four) hours as needed. 20 capsule 0   rosuvastatin  (CRESTOR ) 10 MG tablet TAKE 1 TABLET BY MOUTH DAILY 90 tablet 3   No current facility-administered medications for this visit.   No results found.  Review of Systems:   A ROS was performed including pertinent positives and negatives as documented in the HPI.   Musculoskeletal Exam:    There were no vitals taken for this visit.  Right hip was well-appearing without erythema or drainage.  Good abduction strength normal gait neurovascular exam is intact  Imaging:      I personally reviewed and interpreted the radiographs.   Assessment:   6-week status post right hip labral repair overall doing extremely well.  At this time I will plan to see her back in 6 weeks for final check  Plan :    - Return to clinic 4 weeks      I personally saw and evaluated the patient, and participated in the management and treatment plan.  Elspeth Parker, MD Attending Physician, Orthopedic Surgery  This document was dictated using Dragon voice recognition software. A reasonable attempt at proof reading has been made to minimize errors.

## 2023-11-26 ENCOUNTER — Ambulatory Visit (HOSPITAL_BASED_OUTPATIENT_CLINIC_OR_DEPARTMENT_OTHER): Payer: PRIVATE HEALTH INSURANCE | Admitting: Physical Therapy

## 2023-11-26 ENCOUNTER — Encounter (HOSPITAL_BASED_OUTPATIENT_CLINIC_OR_DEPARTMENT_OTHER): Payer: Self-pay | Admitting: Physical Therapy

## 2023-11-26 DIAGNOSIS — M6281 Muscle weakness (generalized): Secondary | ICD-10-CM

## 2023-11-26 DIAGNOSIS — M25551 Pain in right hip: Secondary | ICD-10-CM

## 2023-11-26 NOTE — Therapy (Signed)
 OUTPATIENT PHYSICAL THERAPY LOWER EXTREMITY TREATMENT    Patient Name: Carolyn Gutierrez MRN: [id number] DOB:27-Nov-1955, 68 y.o., female Today's Date: 11/26/2023  END OF SESSION:  PT End of Session - 11/26/23 1147     Visit Number 10    Number of Visits 15    Date for Recertification  01/05/24    Authorization Type Medicare    Progress Note Due on Visit 20    PT Start Time 1147    PT Stop Time 1227    PT Time Calculation (min) 40 min    Activity Tolerance Patient tolerated treatment well    Behavior During Therapy [facility] for tasks assessed/performed           Past Medical History:  Diagnosis Date   Allergy    Anxiety    Aortic atherosclerosis 03/30/2021   Asthma    in the past- no inhaler currently   Depression    Elevated blood pressure reading 03/30/2021   Genital warts    Hay fever    Migraine    Pure hypercholesterolemia 03/30/2021   Stress fracture of right foot    Past Surgical History:  Procedure Laterality Date   ABDOMINAL HYSTERECTOMY  01/17/2012   Procedure: HYSTERECTOMY ABDOMINAL;  Surgeon: Rosaline LITTIE Cobble, MD;  Location: [facility];  Service: Gynecology;  Laterality: N/A;   BREAST SURGERY     breast bx   COLONOSCOPY     11 yr ago with dr coleman ? a few polyps, no report   KNEE SURGERY     right   LAPAROSCOPIC ASSISTED VAGINAL HYSTERECTOMY  01/17/2012   Procedure: LAPAROSCOPIC ASSISTED VAGINAL HYSTERECTOMY;  Surgeon: Rosaline LITTIE Cobble, MD;  Location: [facility];  Service: Gynecology;  Laterality: N/A;  attempted    SALPINGOOPHORECTOMY  01/17/2012   Procedure: SALPINGO OOPHORECTOMY;  Surgeon: Rosaline LITTIE Cobble, MD;  Location: [facility];  Service: Gynecology;  Laterality: Right;   Patient Active Problem List   Diagnosis Date Noted   CAD in native artery 03/30/2021   Elevated blood pressure reading 03/30/2021   Pure hypercholesterolemia 03/30/2021   Aortic atherosclerosis 03/30/2021   Overweight (BMI 25.0-29.9) 08/20/2017   Vertigo 08/15/2017   Pulmonary  nodule 04/30/2017   Acute bilateral low back pain without sciatica 04/30/2017   Plantar fasciitis, right 04/09/2017   Osteopenia 07/07/2015   Headache, migraine 03/30/2015   Delusional disorder (HCC) 11/17/2013    PCP: Burney Darice LITTIE, MD   REFERRING PROVIDER: Genelle Standing, MD  REFERRING DIAG:  left hamstring tendinitis; D23.697J (ICD-10-CM) - Left hamstring injury, initial encounter  M25.551 (ICD-10-CM) - Pain of right hip   S/p Rt hip labral repair  THERAPY DIAG:  Pain of right hip  Muscle weakness (generalized)  Rationale for Evaluation and Treatment: Rehabilitation  ONSET DATE: June 2025 DOS 10/04/23  SUBJECTIVE:   SUBJECTIVE STATEMENT:  Progress Note Reporting Period 8.1.205 to 11/26/2023]  See note below for Objective Data and Assessment of Progress/Goals.     I am getting stronger. When I cross Rt leg at knee to shave, soap, get out of car- Rt lateral hip feels stiff. To step into Jeep on passenger side, lifting Lt side hurts the right. Sleeping on right is uncomfortable. Have not tried squatting to floor and get up yet. Unsure about lifting weighted objects from floor. I haven't returned to moving furniture at [employer]. Unsure about reciprocal stairs pattern. Used to start AM by touching floor but lost flexibility. Walking about 30 min creates stiffness in lateral  hip. Concordant buttock pain has decreased.   PERTINENT HISTORY: Hx osteopenia, hx LBP, HLD  PAIN:  Are you having pain? Yes: NPRS scale: 2/10 Pain location: L glute/SIJ Pain description: sore Aggravating factors: bending Relieving factors: walking  PRECAUTIONS: None  WEIGHT BEARING RESTRICTIONS: No, WBAT post op  FALLS:  Has patient fallen in last 6 months? No    PLOF: Independent  PATIENT GOALS: get rid of hamstring pain.   OBJECTIVE: (objective measures from initial evaluation unless otherwise dated)   Extreme difficulty/unable (0), Quite a bit of difficulty (1), Moderate  difficulty (2), Little difficulty (3), No difficulty (4) Survey date:   11/26/23  Any of your usual work, housework or school activities 1 3  2. Usual hobbies, recreational or sporting activities 0 2  3. Getting into/out of the bath 1 4  4. Walking between rooms 3 4  5. Putting on socks/shoes 1 3  6. Squatting  0 2  7. Lifting an object, like a bag of groceries from the floor 0 2  8. Performing light activities around your home 1 4  9. Performing heavy activities around your home 0 1  10. Getting into/out of a car 1 3  11. Walking 2 blocks 0 4  12. Walking 1 mile 0 4  13. Going up/down 10 stairs (1 flight) 0 2  14. Standing for 1 hour 0 4  15.  sitting for 1 hour 1 4  16. Running on even ground 0 0  17. Running on uneven ground 0 2  18. Making sharp turns while running fast 0 2  19. Hopping  0 2  20. Rolling over in bed 2 4  Score total:  11/80 56/80    POSTURE: No Significant postural limitations  PALPATION: TTP L glute max, piriformis, minimally at L hamstring origin   OBSERVATIONS   L LE significantly longer than R, sx and area of pain consistent with SIJ involvement     LOWER EXTREMITY MMT:  MMT Right eval Left eval Right 09/25/23 Left 09/25/23 Rt/Lt  10/20  Hip flexion 5 5     Hip extension 4+ 5 4+ 5   Hip abduction 4+ 5 5 5  38.4/42.1  Hip adduction       Hip internal rotation       Hip external rotation       Knee flexion 5 5   35.1/39.6  Knee extension 5 5   54.6/70+   (Blank rows = not tested) *= pain/symptoms   GAIT:  9/4 Comments:    TODAY'S TREATMENT:                                                                                                                              DATE:  11/26/23 PN & discussion of ADLs Tall kneel squat, also added blue tband at waist Half kneel chop across leg Dead lift progression through kettle bells and added squat Deep squat  11/20/23 Elliptical  7 min lvl 4  LF leg press x10 at 25#, 2x10 at 40# (seat lvl  8) Hip abduction machine 30# 3x10 Hip adduction machine 55# 3x10 Leg extension machine 3x10 25# (seat lvl 3, knee position 11, ankle position L) Quadruped rocking stretch 2x10 Lunges 3x10 B BKFO stretch 3x10  11/15/23 Elliptical x6 min  Lunges 2x6 bilaterally  Step ups 8 inch box 3x10 Squats 2x10 with no hand assistance RDL with 5# weight in both hands   Active hamstring stretch 2x10 with 5 second holds  Education on safe activity/exercise  11/13/23 LF bike lvl 5 x 5 min  Massage gun to R ITB, piriformis, glutes, and quads  Mini squats 3x10 Lateral walk with GTB x6 laps Step ups 6 inch box 2x10  RDL with 3# 2x10  11/09/23 STM to right piriformis, quads, iliopsoas Glute bridges 3x10 Prone hamstring curl 3# 3x10 LAQ 3x10 5#  Sit to stand 3x10 Step ups 4 inch step 2x10 Standing flexed on elbows- hip hike ball bw kneees Lateral stepping with GTB x5 laps down and back on rail Thomas stretch 3x30 seconds  Active hamstring stretch 3x30 seconds   11/06/23 STM to right piriformis  Glute bridges 3x10 Prone hamstring curls 2.5# 3x10 LAQ 3x10 3.5# Mini squats 2x10 Sit to stand 2x10 Step ups 4 inch step x10 Step taps 4 inch step x10 Supine active hamstring stretch x10 SLS 3x30 seconds   11/01/23 Standing flexed on elbows- hip hike ball bw kneees Sidelying 90/90 feet on wall- hip flexion rolling on ball bw knees- removed ball and added Rt adduction today STM Rt hip external rotators, TFL Standing hip hinge  10/25/23 Qped rocking- hamstring engagement, ab set, neutral pelvic tilt Sidelying 90/90 feet on wall, clam, ball bw ankles- cues for rib cage lift & neutral pelvis to avoid HS overuse Sidelying 90/90 feet on wall- hip flexion rolling on ball bw knees Gait- reduction of ant drift of femoral head in Rt stance phase Seated for car: pelvis twist, isometric add/abd Heel raises with hips in neutral rather than drifting anteriorly  10/18/23 Dressing change Manual: STM hip  flexors/quads/TFL on R Bridge 2 x 10 TA activation with bent knee fall outs 2 x 10 Prone hamstring curl 2 x 10 Prone hip ER/IR 2 x 10  Hip flexion stretch with non-surgical leg on table 2x10 with 5 second hold  10/11/23 Changed bandages See HEP Gait training to reduce anterior motion of hip ahead of trunk  PATIENT EDUCATION:  Education details: Anatomy of condition, POC, HEP, exercise form/rationale 11/15/23: education on proper/safe activity 11/20/23: education on relevant anatomy, gym routine, and activity tolerance   Person educated: Patient Education method: Programmer, multimedia, Demonstration, and Handouts Education comprehension: verbalized understanding, returned demonstration, verbal cues required, and tactile cues required  HOME EXERCISE PROGRAM: Prehab- Access Code: [email or web address] URL: [email or web address]  Access Code: [email or web address] URL: [email or web address] Date: 10/11/2023 Prepared by: Harlene Cordon    ASSESSMENT:  CLINICAL IMPRESSION: Continues to progress with tolerance to exercise. Discomfort at lateral hip consistent with weakness as expected at this time post op. Exercise difficulty and range progressed in HEP with education on importance of continued use of dynamic stretching with proper muscle activation. Will continue to benefit from skilled PT to reach long term goals.   OBJECTIVE IMPAIRMENTS: decreased activity tolerance, decreased balance, decreased endurance, decreased mobility, difficulty walking, decreased ROM, decreased strength, increased muscle spasms, impaired flexibility, improper body mechanics, and pain  ACTIVITY LIMITATIONS: lifting, bending, sitting, standing, squatting, stairs, transfers, locomotion level, and  caring for others  PARTICIPATION LIMITATIONS: meal prep, cleaning, laundry, shopping, community activity, occupation, and yard work  PERSONAL FACTORS: 3+ comorbidities: Hx osteopenia, hx LBP, HLD, R hip pain are also  affecting patient's functional outcome.   REHAB POTENTIAL: Good  CLINICAL DECISION MAKING: Evolving/moderate complexity  EVALUATION COMPLEXITY: Moderate   GOALS: Goals reviewed with patient? Yes  SHORT TERM GOALS: Target date: 4 weeks 9/27  Pain free flexion to 90 Baseline: Goal status: MET   2.  Demo controlled quad set with hold Baseline:  Goal status: MET   3.  30s sit to stand without pain or compensation Baseline:  Goal status: MET   4.  SLS 30s without compensation Baseline:  Goal status: MET    LONG TERM GOALS:  Able to demo step up on 6 step with level pelvis and proper form Baseline:  Goal status: INITIAL Week 8 10/25  2.  Will tolerate at least 3 min on elliptical, demonstrating good tolerance to repetitive weight bearing motion Baseline:  Goal status: INITIAL  Week 8 10/25  3.  Demonstrate proper form in at least 10 continuous lunges without increased pain Baseline:  Goal status: INITIAL  Week 12 11/22  4.  Demonstrate ability to get up and down from the floor without limitation by hip pain Baseline:  Goal status: INITIAL Week 12 11/22  PLAN:  PT FREQUENCY: 1-2x/week  PT DURATION: 3 weeks  PLANNED INTERVENTIONS: 97164- PT Re-evaluation, 97110-Therapeutic exercises, 97530- Therapeutic activity, 97112- Neuromuscular re-education, 97535- Self Care, 02859- Manual therapy, [id number]- Gait training, [phone]- Orthotic Fit/training, [phone]- Canalith repositioning, [id number]- Aquatic Therapy, 97760- Splinting, [phone]- Wound care (first 20 sq cm), 97598- Wound care (each additional 20 sq cm)Patient/Family education, Balance training, Stair training, Taping, Dry Needling, Joint mobilization, Joint manipulation, Spinal manipulation, Spinal mobilization, Scar mobilization, and DME instructions.  PLAN FOR NEXT SESSION: f/u with HEP. Hip mobility, manual for pain/mobility, glute/hamstring strength on L.   Yentl Verge C. Brynn Reznik PT, DPT 11/26/23 12:44 PM

## 2023-12-05 ENCOUNTER — Ambulatory Visit (HOSPITAL_BASED_OUTPATIENT_CLINIC_OR_DEPARTMENT_OTHER): Payer: PRIVATE HEALTH INSURANCE | Admitting: Physical Therapy

## 2023-12-05 ENCOUNTER — Encounter (HOSPITAL_BASED_OUTPATIENT_CLINIC_OR_DEPARTMENT_OTHER): Payer: Self-pay | Admitting: Physical Therapy

## 2023-12-05 DIAGNOSIS — R2689 Other abnormalities of gait and mobility: Secondary | ICD-10-CM

## 2023-12-05 DIAGNOSIS — M25551 Pain in right hip: Secondary | ICD-10-CM | POA: Diagnosis not present

## 2023-12-05 DIAGNOSIS — R29898 Other symptoms and signs involving the musculoskeletal system: Secondary | ICD-10-CM

## 2023-12-05 DIAGNOSIS — M6281 Muscle weakness (generalized): Secondary | ICD-10-CM

## 2023-12-05 NOTE — Therapy (Addendum)
 OUTPATIENT PHYSICAL THERAPY LOWER EXTREMITY TREATMENT    Patient Name: Carolyn Gutierrez MRN: [id number] DOB:[birth year], 68 y.o., female Today's Date: 12/05/2023  END OF SESSION:  PT End of Session - 12/05/23 0801     Visit Number 11    Number of Visits 15    Date for Recertification  01/05/24    Authorization Type Medicare    Progress Note Due on Visit 20    PT Start Time 0800    PT Stop Time 0846    PT Time Calculation (min) 46 min    Activity Tolerance Patient tolerated treatment well    Behavior During Therapy [facility] for tasks assessed/performed           Past Medical History:  Diagnosis Date   Allergy    Anxiety    Aortic atherosclerosis 03/30/2021   Asthma    in the past- no inhaler currently   Depression    Elevated blood pressure reading 03/30/2021   Genital warts    Hay fever    Migraine    Pure hypercholesterolemia 03/30/2021   Stress fracture of right foot    Past Surgical History:  Procedure Laterality Date   ABDOMINAL HYSTERECTOMY  01/17/2012   Procedure: HYSTERECTOMY ABDOMINAL;  Surgeon: Rosaline LITTIE Cobble, MD;  Location: [facility];  Service: Gynecology;  Laterality: N/A;   BREAST SURGERY     breast bx   COLONOSCOPY     11 yr ago with dr coleman ? a few polyps, no report   KNEE SURGERY     right   LAPAROSCOPIC ASSISTED VAGINAL HYSTERECTOMY  01/17/2012   Procedure: LAPAROSCOPIC ASSISTED VAGINAL HYSTERECTOMY;  Surgeon: Rosaline LITTIE Cobble, MD;  Location: [facility];  Service: Gynecology;  Laterality: N/A;  attempted    SALPINGOOPHORECTOMY  01/17/2012   Procedure: SALPINGO OOPHORECTOMY;  Surgeon: Rosaline LITTIE Cobble, MD;  Location: [facility];  Service: Gynecology;  Laterality: Right;   Patient Active Problem List   Diagnosis Date Noted   CAD in native artery 03/30/2021   Elevated blood pressure reading 03/30/2021   Pure hypercholesterolemia 03/30/2021   Aortic atherosclerosis 03/30/2021   Overweight (BMI 25.0-29.9) 08/20/2017   Vertigo 08/15/2017   Pulmonary  nodule 04/30/2017   Acute bilateral low back pain without sciatica 04/30/2017   Plantar fasciitis, right 04/09/2017   Osteopenia 07/07/2015   Headache, migraine 03/30/2015   Delusional disorder (HCC) 11/17/2013    PCP: Burney Darice LITTIE, MD   REFERRING PROVIDER: Genelle Standing, MD  REFERRING DIAG:  left hamstring tendinitis; D23.697J (ICD-10-CM) - Left hamstring injury, initial encounter  M25.551 (ICD-10-CM) - Pain of right hip   S/p Rt hip labral repair  THERAPY DIAG:  Pain of right hip  Muscle weakness (generalized)  Other abnormalities of gait and mobility  Other symptoms and signs involving the musculoskeletal system  Rationale for Evaluation and Treatment: Rehabilitation  ONSET DATE: June 2025 DOS 10/04/23  SUBJECTIVE:   SUBJECTIVE STATEMENT: Really sore from new exercises. States she did the exercises Wednesday afternoon and she was so sore she had to take tylenol . Request alternative option for one exercise.   PERTINENT HISTORY: Hx osteopenia, hx LBP, HLD  PAIN:  Are you having pain? Yes: NPRS scale: 2/10 Pain location: L glute/SIJ Pain description: sore Aggravating factors: bending Relieving factors: walking  PRECAUTIONS: None  WEIGHT BEARING RESTRICTIONS: No, WBAT post op  FALLS:  Has patient fallen in last 6 months? No    PLOF: Independent  PATIENT GOALS: get rid of hamstring pain.  OBJECTIVE: (objective measures from initial evaluation unless otherwise dated)   Extreme difficulty/unable (0), Quite a bit of difficulty (1), Moderate difficulty (2), Little difficulty (3), No difficulty (4) Survey date:   11/26/23  Any of your usual work, housework or school activities 1 3  2. Usual hobbies, recreational or sporting activities 0 2  3. Getting into/out of the bath 1 4  4. Walking between rooms 3 4  5. Putting on socks/shoes 1 3  6. Squatting  0 2  7. Lifting an object, like a bag of groceries from the floor 0 2  8. Performing light  activities around your home 1 4  9. Performing heavy activities around your home 0 1  10. Getting into/out of a car 1 3  11. Walking 2 blocks 0 4  12. Walking 1 mile 0 4  13. Going up/down 10 stairs (1 flight) 0 2  14. Standing for 1 hour 0 4  15.  sitting for 1 hour 1 4  16. Running on even ground 0 0  17. Running on uneven ground 0 2  18. Making sharp turns while running fast 0 2  19. Hopping  0 2  20. Rolling over in bed 2 4  Score total:  11/80 56/80    POSTURE: No Significant postural limitations  PALPATION: TTP L glute max, piriformis, minimally at L hamstring origin   OBSERVATIONS   L LE significantly longer than R, sx and area of pain consistent with SIJ involvement     LOWER EXTREMITY MMT:  MMT Right eval Left eval Right 09/25/23 Left 09/25/23 Rt/Lt  10/20  Hip flexion 5 5     Hip extension 4+ 5 4+ 5   Hip abduction 4+ 5 5 5  38.4/42.1  Hip adduction       Hip internal rotation       Hip external rotation       Knee flexion 5 5   35.1/39.6  Knee extension 5 5   54.6/70+   (Blank rows = not tested) *= pain/symptoms   GAIT:  9/4 Comments:    TODAY'S TREATMENT:                                                                                                                              DATE:  12/05/23 Elliptical x6 min  Reciprocal stair navigation Thomas stretch x30 seconds bilat  Prone knee bend stretch x30 seconds bilat  RDLs 10# KB x10 Deep squats x10   11/26/23 PN & discussion of ADLs Tall kneel squat, also added blue tband at waist Half kneel chop across leg Dead lift progression through kettle bells and added squat Deep squat  11/20/23 Elliptical 7 min lvl 4  LF leg press x10 at 25#, 2x10 at 40# (seat lvl 8) Hip abduction machine 30# 3x10 Hip adduction machine 55# 3x10 Leg extension machine 3x10 25# (seat lvl 3, knee position 11, ankle position L) Quadruped rocking stretch 2x10  Lunges 3x10 B BKFO stretch 3x10  11/15/23 Elliptical x6  min  Lunges 2x6 bilaterally  Step ups 8 inch box 3x10 Squats 2x10 with no hand assistance RDL with 5# weight in both hands   Active hamstring stretch 2x10 with 5 second holds  Education on safe activity/exercise  11/13/23 LF bike lvl 5 x 5 min  Massage gun to R ITB, piriformis, glutes, and quads  Mini squats 3x10 Lateral walk with GTB x6 laps Step ups 6 inch box 2x10  RDL with 3# 2x10  11/09/23 STM to right piriformis, quads, iliopsoas Glute bridges 3x10 Prone hamstring curl 3# 3x10 LAQ 3x10 5#  Sit to stand 3x10 Step ups 4 inch step 2x10 Standing flexed on elbows- hip hike ball bw kneees Lateral stepping with GTB x5 laps down and back on rail Thomas stretch 3x30 seconds  Active hamstring stretch 3x30 seconds   11/06/23 STM to right piriformis  Glute bridges 3x10 Prone hamstring curls 2.5# 3x10 LAQ 3x10 3.5# Mini squats 2x10 Sit to stand 2x10 Step ups 4 inch step x10 Step taps 4 inch step x10 Supine active hamstring stretch x10 SLS 3x30 seconds   11/01/23 Standing flexed on elbows- hip hike ball bw kneees Sidelying 90/90 feet on wall- hip flexion rolling on ball bw knees- removed ball and added Rt adduction today STM Rt hip external rotators, TFL Standing hip hinge  10/25/23 Qped rocking- hamstring engagement, ab set, neutral pelvic tilt Sidelying 90/90 feet on wall, clam, ball bw ankles- cues for rib cage lift & neutral pelvis to avoid HS overuse Sidelying 90/90 feet on wall- hip flexion rolling on ball bw knees Gait- reduction of ant drift of femoral head in Rt stance phase Seated for car: pelvis twist, isometric add/abd Heel raises with hips in neutral rather than drifting anteriorly  10/18/23 Dressing change Manual: STM hip flexors/quads/TFL on R Bridge 2 x 10 TA activation with bent knee fall outs 2 x 10 Prone hamstring curl 2 x 10 Prone hip ER/IR 2 x 10  Hip flexion stretch with non-surgical leg on table 2x10 with 5 second hold  10/11/23 Changed  bandages See HEP Gait training to reduce anterior motion of hip ahead of trunk  PATIENT EDUCATION:  Education details: Anatomy of condition, POC, HEP, exercise form/rationale 11/15/23: education on proper/safe activity 11/20/23: education on relevant anatomy, gym routine, and activity tolerance   Person educated: Patient Education method: Programmer, Multimedia, Demonstration, and Handouts Education comprehension: verbalized understanding, returned demonstration, verbal cues required, and tactile cues required  HOME EXERCISE PROGRAM: Prehab- Access Code: [email or web address] URL: [email or web address]  Access Code: [email or web address] URL: [email or web address] Date: 10/11/2023 Prepared by: Harlene Cordon    ASSESSMENT:  CLINICAL IMPRESSION: Continues to progress with tolerance to exercise. Treatment focused on stretching to alleviate tight musculature and soreness. Reviewed exercises given at previous session to ensure form was correct since the patient has so much soreness. Educated on activity tolerance and adjusted sets/reps to HEP at home. Educated on reciprocal stair navigation. Will continue to benefit from therapy to address remaining limitations   OBJECTIVE IMPAIRMENTS: decreased activity tolerance, decreased balance, decreased endurance, decreased mobility, difficulty walking, decreased ROM, decreased strength, increased muscle spasms, impaired flexibility, improper body mechanics, and pain  ACTIVITY LIMITATIONS: lifting, bending, sitting, standing, squatting, stairs, transfers, locomotion level, and caring for others  PARTICIPATION LIMITATIONS: meal prep, cleaning, laundry, shopping, community activity, occupation, and yard work  PERSONAL FACTORS: 3+ comorbidities: Hx osteopenia, hx LBP, HLD, R hip pain are also affecting patient's functional outcome.  REHAB POTENTIAL: Good  CLINICAL DECISION MAKING: Evolving/moderate complexity  EVALUATION COMPLEXITY:  Moderate   GOALS: Goals reviewed with patient? Yes  SHORT TERM GOALS: Target date: 4 weeks 9/27  Pain free flexion to 90 Baseline: Goal status: MET   2.  Demo controlled quad set with hold Baseline:  Goal status: MET   3.  30s sit to stand without pain or compensation Baseline:  Goal status: MET   4.  SLS 30s without compensation Baseline:  Goal status: MET    LONG TERM GOALS:  Able to demo step up on 6 step with level pelvis and proper form Baseline:  Goal status: INITIAL Week 8 10/25  2.  Will tolerate at least 3 min on elliptical, demonstrating good tolerance to repetitive weight bearing motion Baseline:  Goal status: INITIAL  Week 8 10/25  3.  Demonstrate proper form in at least 10 continuous lunges without increased pain Baseline:  Goal status: INITIAL  Week 12 11/22  4.  Demonstrate ability to get up and down from the floor without limitation by hip pain Baseline:  Goal status: INITIAL Week 12 11/22  PLAN:  PT FREQUENCY: 1-2x/week  PT DURATION: 3 weeks  PLANNED INTERVENTIONS: 97164- PT Re-evaluation, 97110-Therapeutic exercises, 97530- Therapeutic activity, 97112- Neuromuscular re-education, 97535- Self Care, 02859- Manual therapy, [id number]- Gait training, [phone]- Orthotic Fit/training, [phone]- Canalith repositioning, [id number]- Aquatic Therapy, 97760- Splinting, [phone]- Wound care (first 20 sq cm), 97598- Wound care (each additional 20 sq cm)Patient/Family education, Balance training, Stair training, Taping, Dry Needling, Joint mobilization, Joint manipulation, Spinal manipulation, Spinal mobilization, Scar mobilization, and DME instructions.  PLAN FOR NEXT SESSION: f/u with HEP. Hip mobility, manual for pain/mobility, glute/hamstring strength on L.    Lili Finder, Student-PT 12/05/2023, 8:48 AM  This entire session was performed under direct supervision and direction of a licensed therapist/therapist assistant . I have personally read, edited and approve of  the note as written. 8:56 AM, 12/05/23 Prentice CANDIE Stains PT, DPT Physical Therapist at [facility]

## 2023-12-10 ENCOUNTER — Encounter: Payer: Self-pay | Admitting: Radiology

## 2023-12-13 ENCOUNTER — Ambulatory Visit (HOSPITAL_BASED_OUTPATIENT_CLINIC_OR_DEPARTMENT_OTHER): Payer: PRIVATE HEALTH INSURANCE | Attending: Orthopedic Surgery | Admitting: Physical Therapy

## 2023-12-13 ENCOUNTER — Encounter (HOSPITAL_BASED_OUTPATIENT_CLINIC_OR_DEPARTMENT_OTHER): Payer: Self-pay | Admitting: Physical Therapy

## 2023-12-13 ENCOUNTER — Other Ambulatory Visit (HOSPITAL_BASED_OUTPATIENT_CLINIC_OR_DEPARTMENT_OTHER): Payer: Self-pay

## 2023-12-13 DIAGNOSIS — R2689 Other abnormalities of gait and mobility: Secondary | ICD-10-CM | POA: Diagnosis present

## 2023-12-13 DIAGNOSIS — M25551 Pain in right hip: Secondary | ICD-10-CM | POA: Diagnosis present

## 2023-12-13 DIAGNOSIS — R29898 Other symptoms and signs involving the musculoskeletal system: Secondary | ICD-10-CM | POA: Diagnosis present

## 2023-12-13 DIAGNOSIS — M6281 Muscle weakness (generalized): Secondary | ICD-10-CM | POA: Insufficient documentation

## 2023-12-13 MED ORDER — COMIRNATY 30 MCG/0.3ML IM SUSY
0.3000 mL | PREFILLED_SYRINGE | Freq: Once | INTRAMUSCULAR | 0 refills | Status: AC
Start: 2023-12-13 — End: 2023-12-14
  Filled 2023-12-13: qty 0.3, 1d supply, fill #0

## 2023-12-13 NOTE — Therapy (Addendum)
 OUTPATIENT PHYSICAL THERAPY LOWER EXTREMITY TREATMENT    Patient Name: Carolyn Gutierrez MRN: [id number] DOB:[birth year], 68 y.o., female Today's Date: 12/13/2023  END OF SESSION:  PT End of Session - 12/13/23 0840     Visit Number 12    Number of Visits 15    Date for Recertification  01/05/24    Authorization Type Medicare    Progress Note Due on Visit 20    PT Start Time 0840    PT Stop Time 0925    PT Time Calculation (min) 45 min    Activity Tolerance Patient tolerated treatment well    Behavior During Therapy [facility] for tasks assessed/performed         Past Medical History:  Diagnosis Date   Allergy    Anxiety    Aortic atherosclerosis 03/30/2021   Asthma    in the past- no inhaler currently   Depression    Elevated blood pressure reading 03/30/2021   Genital warts    Hay fever    Migraine    Pure hypercholesterolemia 03/30/2021   Stress fracture of right foot    Past Surgical History:  Procedure Laterality Date   ABDOMINAL HYSTERECTOMY  01/17/2012   Procedure: HYSTERECTOMY ABDOMINAL;  Surgeon: Rosaline LITTIE Cobble, MD;  Location: [facility];  Service: Gynecology;  Laterality: N/A;   BREAST SURGERY     breast bx   COLONOSCOPY     11 yr ago with dr coleman ? a few polyps, no report   KNEE SURGERY     right   LAPAROSCOPIC ASSISTED VAGINAL HYSTERECTOMY  01/17/2012   Procedure: LAPAROSCOPIC ASSISTED VAGINAL HYSTERECTOMY;  Surgeon: Rosaline LITTIE Cobble, MD;  Location: [facility];  Service: Gynecology;  Laterality: N/A;  attempted    SALPINGOOPHORECTOMY  01/17/2012   Procedure: SALPINGO OOPHORECTOMY;  Surgeon: Rosaline LITTIE Cobble, MD;  Location: [facility];  Service: Gynecology;  Laterality: Right;   Patient Active Problem List   Diagnosis Date Noted   CAD in native artery 03/30/2021   Elevated blood pressure reading 03/30/2021   Pure hypercholesterolemia 03/30/2021   Aortic atherosclerosis 03/30/2021   Overweight (BMI 25.0-29.9) 08/20/2017   Vertigo 08/15/2017   Pulmonary nodule  04/30/2017   Acute bilateral low back pain without sciatica 04/30/2017   Plantar fasciitis, right 04/09/2017   Osteopenia 07/07/2015   Headache, migraine 03/30/2015   Delusional disorder (HCC) 11/17/2013    PCP: Burney Darice LITTIE, MD   REFERRING PROVIDER: Genelle Standing, MD  REFERRING DIAG:  left hamstring tendinitis; D23.697J (ICD-10-CM) - Left hamstring injury, initial encounter  M25.551 (ICD-10-CM) - Pain of right hip   S/p Rt hip labral repair  THERAPY DIAG:  Pain of right hip  Muscle weakness (generalized)  Other abnormalities of gait and mobility  Other symptoms and signs involving the musculoskeletal system  Rationale for Evaluation and Treatment: Rehabilitation  ONSET DATE: June 2025 DOS 10/04/23  SUBJECTIVE:   SUBJECTIVE STATEMENT: States she is stiff this morning. Has been sore form new exercises but is getting better.   PERTINENT HISTORY: Hx osteopenia, hx LBP, HLD  PAIN:  Are you having pain? Yes: NPRS scale: 2/10 Pain location: L glute/SIJ Pain description: sore Aggravating factors: bending Relieving factors: walking  PRECAUTIONS: None  WEIGHT BEARING RESTRICTIONS: No, WBAT post op  FALLS:  Has patient fallen in last 6 months? No    PLOF: Independent  PATIENT GOALS: get rid of hamstring pain.   OBJECTIVE: (objective measures from initial evaluation unless otherwise dated)   Extreme difficulty/unable (  0), Quite a bit of difficulty (1), Moderate difficulty (2), Little difficulty (3), No difficulty (4) Survey date:   11/26/23  Any of your usual work, housework or school activities 1 3  2. Usual hobbies, recreational or sporting activities 0 2  3. Getting into/out of the bath 1 4  4. Walking between rooms 3 4  5. Putting on socks/shoes 1 3  6. Squatting  0 2  7. Lifting an object, like a bag of groceries from the floor 0 2  8. Performing light activities around your home 1 4  9. Performing heavy activities around your home 0 1  10.  Getting into/out of a car 1 3  11. Walking 2 blocks 0 4  12. Walking 1 mile 0 4  13. Going up/down 10 stairs (1 flight) 0 2  14. Standing for 1 hour 0 4  15.  sitting for 1 hour 1 4  16. Running on even ground 0 0  17. Running on uneven ground 0 2  18. Making sharp turns while running fast 0 2  19. Hopping  0 2  20. Rolling over in bed 2 4  Score total:  11/80 56/80    POSTURE: No Significant postural limitations  PALPATION: TTP L glute max, piriformis, minimally at L hamstring origin   OBSERVATIONS   L LE significantly longer than R, sx and area of pain consistent with SIJ involvement     LOWER EXTREMITY MMT:  MMT Right eval Left eval Right 09/25/23 Left 09/25/23 Rt/Lt  10/20  Hip flexion 5 5     Hip extension 4+ 5 4+ 5   Hip abduction 4+ 5 5 5  38.4/42.1  Hip adduction       Hip internal rotation       Hip external rotation       Knee flexion 5 5   35.1/39.6  Knee extension 5 5   54.6/70+   (Blank rows = not tested) *= pain/symptoms   GAIT:  9/4 Comments:    TODAY'S TREATMENT:                                                                                                                              DATE:  12/13/23 Elliptical x6 min  Manual: STM to glutes, ITB ITB stretch in supine x1 min Lunges x10 Deep squat x10 Hip hinge x10  SL balance in mirror 2x10 seconds  Education on pain science   12/05/23 Elliptical x6 min  Reciprocal stair navigation Thomas stretch x30 seconds bilat  Prone knee bend stretch x30 seconds bilat  RDLs 10# KB x10 Deep squats x10   11/26/23 PN & discussion of ADLs Tall kneel squat, also added blue tband at waist Half kneel chop across leg Dead lift progression through kettle bells and added squat Deep squat  11/20/23 Elliptical 7 min lvl 4  LF leg press x10 at 25#, 2x10 at 40# (seat lvl 8) Hip abduction machine  30# 3x10 Hip adduction machine 55# 3x10 Leg extension machine 3x10 25# (seat lvl 3, knee position 11, ankle  position L) Quadruped rocking stretch 2x10 Lunges 3x10 B BKFO stretch 3x10  11/15/23 Elliptical x6 min  Lunges 2x6 bilaterally  Step ups 8 inch box 3x10 Squats 2x10 with no hand assistance RDL with 5# weight in both hands   Active hamstring stretch 2x10 with 5 second holds  Education on safe activity/exercise  11/13/23 LF bike lvl 5 x 5 min  Massage gun to R ITB, piriformis, glutes, and quads  Mini squats 3x10 Lateral walk with GTB x6 laps Step ups 6 inch box 2x10  RDL with 3# 2x10  11/09/23 STM to right piriformis, quads, iliopsoas Glute bridges 3x10 Prone hamstring curl 3# 3x10 LAQ 3x10 5#  Sit to stand 3x10 Step ups 4 inch step 2x10 Standing flexed on elbows- hip hike ball bw kneees Lateral stepping with GTB x5 laps down and back on rail Thomas stretch 3x30 seconds  Active hamstring stretch 3x30 seconds   11/06/23 STM to right piriformis  Glute bridges 3x10 Prone hamstring curls 2.5# 3x10 LAQ 3x10 3.5# Mini squats 2x10 Sit to stand 2x10 Step ups 4 inch step x10 Step taps 4 inch step x10 Supine active hamstring stretch x10 SLS 3x30 seconds   11/01/23 Standing flexed on elbows- hip hike ball bw kneees Sidelying 90/90 feet on wall- hip flexion rolling on ball bw knees- removed ball and added Rt adduction today STM Rt hip external rotators, TFL Standing hip hinge  10/25/23 Qped rocking- hamstring engagement, ab set, neutral pelvic tilt Sidelying 90/90 feet on wall, clam, ball bw ankles- cues for rib cage lift & neutral pelvis to avoid HS overuse Sidelying 90/90 feet on wall- hip flexion rolling on ball bw knees Gait- reduction of ant drift of femoral head in Rt stance phase Seated for car: pelvis twist, isometric add/abd Heel raises with hips in neutral rather than drifting anteriorly  10/18/23 Dressing change Manual: STM hip flexors/quads/TFL on R Bridge 2 x 10 TA activation with bent knee fall outs 2 x 10 Prone hamstring curl 2 x 10 Prone hip ER/IR 2 x 10   Hip flexion stretch with non-surgical leg on table 2x10 with 5 second hold  10/11/23 Changed bandages See HEP Gait training to reduce anterior motion of hip ahead of trunk  PATIENT EDUCATION:  Education details: Anatomy of condition, POC, HEP, exercise form/rationale 11/15/23: education on proper/safe activity 11/20/23: education on relevant anatomy, gym routine, and activity tolerance   Person educated: Patient Education method: Programmer, Multimedia, Demonstration, and Handouts Education comprehension: verbalized understanding, returned demonstration, verbal cues required, and tactile cues required  HOME EXERCISE PROGRAM: Prehab- Access Code: [email or web address] URL: [email or web address]  Access Code: [email or web address] URL: [email or web address] Date: 10/11/2023 Prepared by: Harlene Cordon    ASSESSMENT:  CLINICAL IMPRESSION: Continues to progress with tolerance to exercise. Treatment focused on stretching to alleviate tight musculature and soreness. Continues to demonstrate tightness/stiffness in R ITB/glutes. Provided her with stretches for home to help stiffness. Educated on activity tolerance and use of heat to help stiffness at home. Updated HEP. Will continue to benefit from therapy to address remaining limitations   OBJECTIVE IMPAIRMENTS: decreased activity tolerance, decreased balance, decreased endurance, decreased mobility, difficulty walking, decreased ROM, decreased strength, increased muscle spasms, impaired flexibility, improper body mechanics, and pain  ACTIVITY LIMITATIONS: lifting, bending, sitting, standing, squatting, stairs, transfers, locomotion level, and caring for others  PARTICIPATION LIMITATIONS: meal prep, cleaning, laundry, shopping, community activity, occupation, and yard  work  PERSONAL FACTORS: 3+ comorbidities: Hx osteopenia, hx LBP, HLD, R hip pain are also affecting patient's functional outcome.   REHAB POTENTIAL: Good  CLINICAL DECISION  MAKING: Evolving/moderate complexity  EVALUATION COMPLEXITY: Moderate   GOALS: Goals reviewed with patient? Yes  SHORT TERM GOALS: Target date: 4 weeks 9/27  Pain free flexion to 90 Baseline: Goal status: MET   2.  Demo controlled quad set with hold Baseline:  Goal status: MET   3.  30s sit to stand without pain or compensation Baseline:  Goal status: MET   4.  SLS 30s without compensation Baseline:  Goal status: MET    LONG TERM GOALS:  Able to demo step up on 6 step with level pelvis and proper form Baseline:  Goal status: INITIAL Week 8 10/25  2.  Will tolerate at least 3 min on elliptical, demonstrating good tolerance to repetitive weight bearing motion Baseline:  Goal status: INITIAL  Week 8 10/25  3.  Demonstrate proper form in at least 10 continuous lunges without increased pain Baseline:  Goal status: INITIAL  Week 12 11/22  4.  Demonstrate ability to get up and down from the floor without limitation by hip pain Baseline:  Goal status: INITIAL Week 12 11/22  PLAN:  PT FREQUENCY: 1-2x/week  PT DURATION: 3 weeks  PLANNED INTERVENTIONS: 97164- PT Re-evaluation, 97110-Therapeutic exercises, 97530- Therapeutic activity, 97112- Neuromuscular re-education, 97535- Self Care, 02859- Manual therapy, [id number]- Gait training, [phone]- Orthotic Fit/training, [phone]- Canalith repositioning, [id number]- Aquatic Therapy, 97760- Splinting, [phone]- Wound care (first 20 sq cm), 97598- Wound care (each additional 20 sq cm)Patient/Family education, Balance training, Stair training, Taping, Dry Needling, Joint mobilization, Joint manipulation, Spinal manipulation, Spinal mobilization, Scar mobilization, and DME instructions.  PLAN FOR NEXT SESSION: f/u with HEP. Hip mobility, manual for pain/mobility, glute/hamstring strength on L.    Lili Finder, Student-PT 12/13/2023, 9:25 AM  This entire session was performed under direct supervision and direction of a licensed  therapist/therapist assistant . I have personally read, edited and approve of the note as written. 9:45 AM, 12/13/23 Prentice CANDIE Stains PT, DPT Physical Therapist at [facility]

## 2023-12-20 ENCOUNTER — Encounter (HOSPITAL_BASED_OUTPATIENT_CLINIC_OR_DEPARTMENT_OTHER): Payer: Self-pay | Admitting: Physical Therapy

## 2023-12-20 ENCOUNTER — Ambulatory Visit (HOSPITAL_BASED_OUTPATIENT_CLINIC_OR_DEPARTMENT_OTHER): Payer: PRIVATE HEALTH INSURANCE | Admitting: Physical Therapy

## 2023-12-20 DIAGNOSIS — R2689 Other abnormalities of gait and mobility: Secondary | ICD-10-CM

## 2023-12-20 DIAGNOSIS — R29898 Other symptoms and signs involving the musculoskeletal system: Secondary | ICD-10-CM

## 2023-12-20 DIAGNOSIS — M25551 Pain in right hip: Secondary | ICD-10-CM

## 2023-12-20 DIAGNOSIS — M6281 Muscle weakness (generalized): Secondary | ICD-10-CM

## 2023-12-20 NOTE — Therapy (Addendum)
 OUTPATIENT PHYSICAL THERAPY LOWER EXTREMITY TREATMENT    Patient Name: Carolyn Gutierrez MRN: [id number] DOB:[birth year], 68 y.o., female Today's Date: 12/20/2023  END OF SESSION:  PT End of Session - 12/20/23 1510     Visit Number 13    Number of Visits 15    Date for Recertification  01/05/24    Authorization Type Medicare    Progress Note Due on Visit 20    PT Start Time 1510    PT Stop Time 1557    PT Time Calculation (min) 47 min    Activity Tolerance Patient tolerated treatment well         Past Medical History:  Diagnosis Date   Allergy    Anxiety    Aortic atherosclerosis 03/30/2021   Asthma    in the past- no inhaler currently   Depression    Elevated blood pressure reading 03/30/2021   Genital warts    Hay fever    Migraine    Pure hypercholesterolemia 03/30/2021   Stress fracture of right foot    Past Surgical History:  Procedure Laterality Date   ABDOMINAL HYSTERECTOMY  01/17/2012   Procedure: HYSTERECTOMY ABDOMINAL;  Surgeon: Rosaline LITTIE Cobble, MD;  Location: [facility];  Service: Gynecology;  Laterality: N/A;   BREAST SURGERY     breast bx   COLONOSCOPY     11 yr ago with dr coleman ? a few polyps, no report   KNEE SURGERY     right   LAPAROSCOPIC ASSISTED VAGINAL HYSTERECTOMY  01/17/2012   Procedure: LAPAROSCOPIC ASSISTED VAGINAL HYSTERECTOMY;  Surgeon: Rosaline LITTIE Cobble, MD;  Location: [facility];  Service: Gynecology;  Laterality: N/A;  attempted    SALPINGOOPHORECTOMY  01/17/2012   Procedure: SALPINGO OOPHORECTOMY;  Surgeon: Rosaline LITTIE Cobble, MD;  Location: [facility];  Service: Gynecology;  Laterality: Right;   Patient Active Problem List   Diagnosis Date Noted   CAD in native artery 03/30/2021   Elevated blood pressure reading 03/30/2021   Pure hypercholesterolemia 03/30/2021   Aortic atherosclerosis 03/30/2021   Overweight (BMI 25.0-29.9) 08/20/2017   Vertigo 08/15/2017   Pulmonary nodule 04/30/2017   Acute bilateral low back pain without sciatica  04/30/2017   Plantar fasciitis, right 04/09/2017   Osteopenia 07/07/2015   Headache, migraine 03/30/2015   Delusional disorder (HCC) 11/17/2013    PCP: Burney Darice LITTIE, MD   REFERRING PROVIDER: Genelle Standing, MD  REFERRING DIAG:  left hamstring tendinitis; D23.697J (ICD-10-CM) - Left hamstring injury, initial encounter  M25.551 (ICD-10-CM) - Pain of right hip   S/p Rt hip labral repair  THERAPY DIAG:  Pain of right hip  Other abnormalities of gait and mobility  Other symptoms and signs involving the musculoskeletal system  Muscle weakness (generalized)  Rationale for Evaluation and Treatment: Rehabilitation  ONSET DATE: June 2025 DOS 10/04/23  SUBJECTIVE:   SUBJECTIVE STATEMENT: States she has some tightness in same spot after treadmill.   PERTINENT HISTORY: Hx osteopenia, hx LBP, HLD  PAIN:  Are you having pain? Yes: NPRS scale: 2/10 Pain location: L glute/SIJ Pain description: sore Aggravating factors: bending Relieving factors: walking  PRECAUTIONS: None  WEIGHT BEARING RESTRICTIONS: No, WBAT post op  FALLS:  Has patient fallen in last 6 months? No    PLOF: Independent  PATIENT GOALS: get rid of hamstring pain.   OBJECTIVE: (objective measures from initial evaluation unless otherwise dated)   Extreme difficulty/unable (0), Quite a bit of difficulty (1), Moderate difficulty (2), Little difficulty (3), No difficulty (4) Survey date:  11/26/23  Any of your usual work, housework or school activities 1 3  2. Usual hobbies, recreational or sporting activities 0 2  3. Getting into/out of the bath 1 4  4. Walking between rooms 3 4  5. Putting on socks/shoes 1 3  6. Squatting  0 2  7. Lifting an object, like a bag of groceries from the floor 0 2  8. Performing light activities around your home 1 4  9. Performing heavy activities around your home 0 1  10. Getting into/out of a car 1 3  11. Walking 2 blocks 0 4  12. Walking 1 mile 0 4  13. Going  up/down 10 stairs (1 flight) 0 2  14. Standing for 1 hour 0 4  15.  sitting for 1 hour 1 4  16. Running on even ground 0 0  17. Running on uneven ground 0 2  18. Making sharp turns while running fast 0 2  19. Hopping  0 2  20. Rolling over in bed 2 4  Score total:  11/80 56/80    POSTURE: No Significant postural limitations  PALPATION: TTP L glute max, piriformis, minimally at L hamstring origin   OBSERVATIONS   L LE significantly longer than R, sx and area of pain consistent with SIJ involvement     LOWER EXTREMITY MMT:  MMT Right eval Left eval Right 09/25/23 Left 09/25/23 Rt/Lt  10/20  Hip flexion 5 5     Hip extension 4+ 5 4+ 5   Hip abduction 4+ 5 5 5  38.4/42.1  Hip adduction       Hip internal rotation       Hip external rotation       Knee flexion 5 5   35.1/39.6  Knee extension 5 5   54.6/70+   (Blank rows = not tested) *= pain/symptoms   GAIT:  9/4 Comments:    TODAY'S TREATMENT:                                                                                                                              DATE:  12/20/23 STM to glutes/ITB Walking lunges x20 Side lunges x20 Hamstring dynamic stretch x20 High knee stretch x20 Calf raises x20 Light jog in place x20 seconds  Walking on treadmill x10 min  Grade [employer] prone hip anterior glides  Gait training in hallway   12/13/23 Elliptical x6 min  Manual: STM to glutes, ITB ITB stretch in supine x1 min Lunges x10 Deep squat x10 Hip hinge x10  SL balance in mirror 2x10 seconds  Education on pain science   12/05/23 Elliptical x6 min  Reciprocal stair navigation Thomas stretch x30 seconds bilat  Prone knee bend stretch x30 seconds bilat  RDLs 10# KB x10 Deep squats x10   11/26/23 PN & discussion of ADLs Tall kneel squat, also added blue tband at waist Half kneel chop across leg Dead lift progression through [employer] and  added squat Deep squat  11/20/23 Elliptical 7 min lvl 4  LF  leg press x10 at 25#, 2x10 at 40# (seat lvl 8) Hip abduction machine 30# 3x10 Hip adduction machine 55# 3x10 Leg extension machine 3x10 25# (seat lvl 3, knee position 11, ankle position L) Quadruped rocking stretch 2x10 Lunges 3x10 B BKFO stretch 3x10  11/15/23 Elliptical x6 min  Lunges 2x6 bilaterally  Step ups 8 inch box 3x10 Squats 2x10 with no hand assistance RDL with 5# weight in both hands   Active hamstring stretch 2x10 with 5 second holds  Education on safe activity/exercise  11/13/23 LF bike lvl 5 x 5 min  Massage gun to R ITB, piriformis, glutes, and quads  Mini squats 3x10 Lateral walk with GTB x6 laps Step ups 6 inch box 2x10  RDL with 3# 2x10  11/09/23 STM to right piriformis, quads, iliopsoas Glute bridges 3x10 Prone hamstring curl 3# 3x10 LAQ 3x10 5#  Sit to stand 3x10 Step ups 4 inch step 2x10 Standing flexed on elbows- hip hike ball bw kneees Lateral stepping with GTB x5 laps down and back on rail Thomas stretch 3x30 seconds  Active hamstring stretch 3x30 seconds   11/06/23 STM to right piriformis  Glute bridges 3x10 Prone hamstring curls 2.5# 3x10 LAQ 3x10 3.5# Mini squats 2x10 Sit to stand 2x10 Step ups 4 inch step x10 Step taps 4 inch step x10 Supine active hamstring stretch x10 SLS 3x30 seconds   11/01/23 Standing flexed on elbows- hip hike ball bw kneees Sidelying 90/90 feet on wall- hip flexion rolling on ball bw knees- removed ball and added Rt adduction today STM Rt hip external rotators, TFL Standing hip hinge  10/25/23 Qped rocking- hamstring engagement, ab set, neutral pelvic tilt Sidelying 90/90 feet on wall, clam, ball bw ankles- cues for rib cage lift & neutral pelvis to avoid HS overuse Sidelying 90/90 feet on wall- hip flexion rolling on ball bw knees Gait- reduction of ant drift of femoral head in Rt stance phase Seated for car: pelvis twist, isometric add/abd Heel raises with hips in neutral rather than drifting  anteriorly  10/18/23 Dressing change Manual: STM hip flexors/quads/TFL on R Bridge 2 x 10 TA activation with bent knee fall outs 2 x 10 Prone hamstring curl 2 x 10 Prone hip ER/IR 2 x 10  Hip flexion stretch with non-surgical leg on table 2x10 with 5 second hold  10/11/23 Changed bandages See HEP Gait training to reduce anterior motion of hip ahead of trunk  PATIENT EDUCATION:  Education details: Anatomy of condition, POC, HEP, exercise form/rationale 11/15/23: education on proper/safe activity 11/20/23: education on relevant anatomy, gym routine, and activity tolerance   Person educated: Patient Education method: Programmer, Multimedia, Demonstration, and Handouts Education comprehension: verbalized understanding, returned demonstration, verbal cues required, and tactile cues required  HOME EXERCISE PROGRAM: Prehab- Access Code: [email or web address] URL: [email or web address]  Access Code: [email or web address] URL: [email or web address] Date: 10/11/2023 Prepared by: Harlene Cordon    ASSESSMENT:  CLINICAL IMPRESSION: Tolerated exercises well. Discussed dynamic warm up to use before walking on treadmill. Assessed gait after increase in pain on treadmill in ITB/glutes. Patient ambulating with R hip hike and early heel off in late stance due to lack of hip extension on right. Increased pain and tightness in right glute max with verbal cueing to decrease hip hike. Plan to continue to strengthen glutes/quads to improve walking tolerance. Will continue to benefit form therapy to address remaining limitations.   OBJECTIVE IMPAIRMENTS: decreased activity tolerance, decreased balance, decreased  endurance, decreased mobility, difficulty walking, decreased ROM, decreased strength, increased muscle spasms, impaired flexibility, improper body mechanics, and pain  ACTIVITY LIMITATIONS: lifting, bending, sitting, standing, squatting, stairs, transfers, locomotion level, and caring for  others  PARTICIPATION LIMITATIONS: meal prep, cleaning, laundry, shopping, community activity, occupation, and yard work  PERSONAL FACTORS: 3+ comorbidities: Hx osteopenia, hx LBP, HLD, R hip pain are also affecting patient's functional outcome.   REHAB POTENTIAL: Good  CLINICAL DECISION MAKING: Evolving/moderate complexity  EVALUATION COMPLEXITY: Moderate   GOALS: Goals reviewed with patient? Yes  SHORT TERM GOALS: Target date: 4 weeks 9/27  Pain free flexion to 90 Baseline: Goal status: MET   2.  Demo controlled quad set with hold Baseline:  Goal status: MET   3.  30s sit to stand without pain or compensation Baseline:  Goal status: MET   4.  SLS 30s without compensation Baseline:  Goal status: MET    LONG TERM GOALS:  Able to demo step up on 6 step with level pelvis and proper form Baseline:  Goal status: INITIAL Week 8 10/25  2.  Will tolerate at least 3 min on elliptical, demonstrating good tolerance to repetitive weight bearing motion Baseline:  Goal status: INITIAL  Week 8 10/25  3.  Demonstrate proper form in at least 10 continuous lunges without increased pain Baseline:  Goal status: INITIAL  Week 12 11/22  4.  Demonstrate ability to get up and down from the floor without limitation by hip pain Baseline:  Goal status: INITIAL Week 12 11/22  PLAN:  PT FREQUENCY: 1-2x/week  PT DURATION: 3 weeks  PLANNED INTERVENTIONS: 97164- PT Re-evaluation, 97110-Therapeutic exercises, 97530- Therapeutic activity, 97112- Neuromuscular re-education, 97535- Self Care, 02859- Manual therapy, [id number]- Gait training, [phone]- Orthotic Fit/training, [phone]- Canalith repositioning, [id number]- Aquatic Therapy, 97760- Splinting, [phone]- Wound care (first 20 sq cm), 97598- Wound care (each additional 20 sq cm)Patient/Family education, Balance training, Stair training, Taping, Dry Needling, Joint mobilization, Joint manipulation, Spinal manipulation, Spinal mobilization, Scar  mobilization, and DME instructions.  PLAN FOR NEXT SESSION: f/u with HEP. Hip mobility, manual for pain/mobility, glute/hamstring strength on L.    Lili Finder, Student-PT 12/20/2023, 3:58 PM  This entire session was performed under direct supervision and direction of a licensed therapist/therapist assistant . I have personally read, edited and approve of the note as written. 4:02 PM, 12/20/23 Prentice CANDIE Stains PT, DPT Physical Therapist at [facility]

## 2023-12-26 ENCOUNTER — Ambulatory Visit (INDEPENDENT_AMBULATORY_CARE_PROVIDER_SITE_OTHER): Payer: PRIVATE HEALTH INSURANCE | Admitting: Orthopaedic Surgery

## 2023-12-26 DIAGNOSIS — M25551 Pain in right hip: Secondary | ICD-10-CM

## 2023-12-26 NOTE — Progress Notes (Signed)
 Post Operative Evaluation    Procedure/Date of Surgery: Right labral repair 8/28  Interval History:   Presents today 12 weeks status post the above procedure.  At this time she is continuing to recover.  She has occasionally having some soreness about the glutes although this is continuing to improve   PMH/PSH/Family History/Social History/Meds/Allergies:    Past Medical History:  Diagnosis Date   Allergy    Anxiety    Aortic atherosclerosis 03/30/2021   Asthma    in the past- no inhaler currently   Depression    Elevated blood pressure reading 03/30/2021   Genital warts    Hay fever    Migraine    Pure hypercholesterolemia 03/30/2021   Stress fracture of right foot    Past Surgical History:  Procedure Laterality Date   ABDOMINAL HYSTERECTOMY  01/17/2012   Procedure: HYSTERECTOMY ABDOMINAL;  Surgeon: Rosaline LITTIE Cobble, MD;  Location: [facility];  Service: Gynecology;  Laterality: N/A;   BREAST SURGERY     breast bx   COLONOSCOPY     11 yr ago with dr coleman ? a few polyps, no report   KNEE SURGERY     right   LAPAROSCOPIC ASSISTED VAGINAL HYSTERECTOMY  01/17/2012   Procedure: LAPAROSCOPIC ASSISTED VAGINAL HYSTERECTOMY;  Surgeon: Rosaline LITTIE Cobble, MD;  Location: [facility];  Service: Gynecology;  Laterality: N/A;  attempted    SALPINGOOPHORECTOMY  01/17/2012   Procedure: SALPINGO OOPHORECTOMY;  Surgeon: Rosaline LITTIE Cobble, MD;  Location: [facility];  Service: Gynecology;  Laterality: Right;   Social History   Socioeconomic History   Marital status: Married    Spouse name: Not on file   Number of children: Not on file   Years of education: Not on file   Highest education level: Not on file  Occupational History   Not on file  Tobacco Use   Smoking status: Never   Smokeless tobacco: Never  Vaping Use   Vaping status: Never Used  Substance and Sexual Activity   Alcohol use: Yes    Alcohol/week: 0.0 standard drinks of alcohol    Comment:  Last drink was over a week ago.    Drug use: No   Sexual activity: Never  Other Topics Concern   Not on file  Social History Narrative   Married.    Bachelor's of arts degree pensions consultant    Wears a bicycle helmet, smoke alarm and home, wears her seatbelt.   Vegetarian, consumes dairy products.   Feels safe in her relationships.   Social Drivers of Corporate Investment Banker Strain: Low Risk  (03/30/2021)   Overall Financial Resource Strain (CARDIA)    Difficulty of Paying Living Expenses: Not hard at all  Food Insecurity: No Food Insecurity (03/30/2021)   Hunger Vital Sign    Worried About Running Out of Food in the Last Year: Never true    Ran Out of Food in the Last Year: Never true  Transportation Needs: No Transportation Needs (03/30/2021)   PRAPARE - Administrator, Civil Service (Medical): No    Lack of Transportation (Non-Medical): No  Physical Activity: Sufficiently Active (03/30/2021)   Exercise Vital Sign    Days of Exercise per Week: 7 days    Minutes of Exercise per Session: 30 min  Stress: Not  on file  Social Connections: Unknown (08/23/2022)   Received from [facility]   Social Network    Social Network: Not on file   Family History  Problem Relation Age of Onset   Cancer Mother        pancreatic   Pancreatic cancer Mother    Depression Mother    Cancer Father        basal cell, bladder   Heart disease Father    Cancer Sister        melanoma   Arthritis Sister    Asthma Sister    Diabetes Sister    Hyperlipidemia Sister    Hypertension Sister    Kidney disease Sister    Diabetes Sister    Hypertension Sister    Arthritis Sister    Heart attack Maternal Uncle    Heart attack Paternal Uncle    Stroke Paternal Grandmother    Colon cancer Neg Hx    Colon polyps Neg Hx    Esophageal cancer Neg Hx    Rectal cancer Neg Hx    Stomach cancer Neg Hx    No Known Allergies Current Outpatient Medications  Medication Sig Dispense  Refill   acetaminophen  (TYLENOL ) 500 MG tablet Take 1 tablet (500 mg total) by mouth every 6 (six) hours as needed for mild pain (pain score 1-3). 30 tablet 0   aspirin  EC 325 MG tablet Take 1 tablet (325 mg total) by mouth daily. 14 tablet 0   Coenzyme Q10 (CO Q 10) 100 MG CAPS Take 1 capsule by mouth daily at 12 noon.     fexofenadine-pseudoephedrine (ALLEGRA-D) 60-120 MG 12 hr tablet Take 1 tablet by mouth as needed.     ibuprofen  (ADVIL ) 800 MG tablet Take 1 tablet (800 mg total) by mouth every 8 (eight) hours as needed. 30 tablet 0   Multiple Vitamin (MULTIVITAMIN WITH MINERALS) TABS Take 1 tablet by mouth daily.     Omega 3-5-6-7-9 Fatty Acids (COMPLETE OMEGA) CAPS Take 1 capsule by mouth daily.     oxycodone  (OXY-IR) 5 MG capsule Take 1 capsule (5 mg total) by mouth every 4 (four) hours as needed. 20 capsule 0   rosuvastatin  (CRESTOR ) 10 MG tablet TAKE 1 TABLET BY MOUTH DAILY 90 tablet 3   No current facility-administered medications for this visit.   No results found.  Review of Systems:   A ROS was performed including pertinent positives and negatives as documented in the HPI.   Musculoskeletal Exam:    There were no vitals taken for this visit.  Right hip was well-appearing without erythema or drainage.  Good abduction strength normal gait neurovascular exam is intact  Imaging:      I personally reviewed and interpreted the radiographs.   Assessment:   12 week status post right hip labral repair overall doing extremely well.  At this time I will plan to see her back as needed  Plan :    - Return to clinic as needed      I personally saw and evaluated the patient, and participated in the management and treatment plan.  Elspeth Parker, MD Attending Physician, Orthopedic Surgery  This document was dictated using Dragon voice recognition software. A reasonable attempt at proof reading has been made to minimize errors.

## 2023-12-27 ENCOUNTER — Encounter (HOSPITAL_BASED_OUTPATIENT_CLINIC_OR_DEPARTMENT_OTHER): Payer: Self-pay | Admitting: Physical Therapy

## 2023-12-27 ENCOUNTER — Ambulatory Visit (HOSPITAL_BASED_OUTPATIENT_CLINIC_OR_DEPARTMENT_OTHER): Admitting: Physical Therapy

## 2023-12-27 ENCOUNTER — Other Ambulatory Visit (HOSPITAL_BASED_OUTPATIENT_CLINIC_OR_DEPARTMENT_OTHER): Payer: Self-pay

## 2023-12-27 DIAGNOSIS — M6281 Muscle weakness (generalized): Secondary | ICD-10-CM

## 2023-12-27 DIAGNOSIS — R2689 Other abnormalities of gait and mobility: Secondary | ICD-10-CM

## 2023-12-27 DIAGNOSIS — M25551 Pain in right hip: Secondary | ICD-10-CM

## 2023-12-27 DIAGNOSIS — R29898 Other symptoms and signs involving the musculoskeletal system: Secondary | ICD-10-CM

## 2023-12-27 MED ORDER — FLUZONE HIGH-DOSE 0.5 ML IM SUSY
0.5000 mL | PREFILLED_SYRINGE | Freq: Once | INTRAMUSCULAR | 0 refills | Status: AC
  Filled 2023-12-27: qty 0.5, 1d supply, fill #0

## 2023-12-27 NOTE — Therapy (Addendum)
 OUTPATIENT PHYSICAL THERAPY LOWER EXTREMITY TREATMENT   Progress Note   Reporting Period 11/26/23 to 12/27/23   See note below for Objective Data and Assessment of Progress/Goals   Patient Name: Carolyn Gutierrez MRN: [id number] DOB:[birth year], 68 y.o., female Today's Date: 12/27/2023  END OF SESSION:  PT End of Session - 12/27/23 1347     Visit Number 14    Number of Visits 21    Date for Recertification  02/07/24    Authorization Type Medicare    Progress Note Due on Visit 20    PT Start Time 1347    PT Stop Time 1428    PT Time Calculation (min) 41 min    Activity Tolerance Patient tolerated treatment well    Behavior During Therapy [facility] for tasks assessed/performed         Past Medical History:  Diagnosis Date   Allergy    Anxiety    Aortic atherosclerosis 03/30/2021   Asthma    in the past- no inhaler currently   Depression    Elevated blood pressure reading 03/30/2021   Genital warts    Hay fever    Migraine    Pure hypercholesterolemia 03/30/2021   Stress fracture of right foot    Past Surgical History:  Procedure Laterality Date   ABDOMINAL HYSTERECTOMY  01/17/2012   Procedure: HYSTERECTOMY ABDOMINAL;  Surgeon: Rosaline LITTIE Cobble, MD;  Location: [facility];  Service: Gynecology;  Laterality: N/A;   BREAST SURGERY     breast bx   COLONOSCOPY     11 yr ago with dr coleman ? a few polyps, no report   KNEE SURGERY     right   LAPAROSCOPIC ASSISTED VAGINAL HYSTERECTOMY  01/17/2012   Procedure: LAPAROSCOPIC ASSISTED VAGINAL HYSTERECTOMY;  Surgeon: Rosaline LITTIE Cobble, MD;  Location: [facility];  Service: Gynecology;  Laterality: N/A;  attempted    SALPINGOOPHORECTOMY  01/17/2012   Procedure: SALPINGO OOPHORECTOMY;  Surgeon: Rosaline LITTIE Cobble, MD;  Location: [facility];  Service: Gynecology;  Laterality: Right;   Patient Active Problem List   Diagnosis Date Noted   CAD in native artery 03/30/2021   Elevated blood pressure reading 03/30/2021   Pure hypercholesterolemia  03/30/2021   Aortic atherosclerosis 03/30/2021   Overweight (BMI 25.0-29.9) 08/20/2017   Vertigo 08/15/2017   Pulmonary nodule 04/30/2017   Acute bilateral low back pain without sciatica 04/30/2017   Plantar fasciitis, right 04/09/2017   Osteopenia 07/07/2015   Headache, migraine 03/30/2015   Delusional disorder (HCC) 11/17/2013    PCP: Burney Darice LITTIE, MD   REFERRING PROVIDER: Genelle Standing, MD  REFERRING DIAG:  left hamstring tendinitis; D23.697J (ICD-10-CM) - Left hamstring injury, initial encounter  M25.551 (ICD-10-CM) - Pain of right hip   S/p Rt hip labral repair  THERAPY DIAG:  Pain of right hip  Other abnormalities of gait and mobility  Other symptoms and signs involving the musculoskeletal system  Muscle weakness (generalized)  Rationale for Evaluation and Treatment: Rehabilitation  ONSET DATE: June 2025 DOS 10/04/23  SUBJECTIVE:   SUBJECTIVE STATEMENT: Reports she is at about 80-85%. Continues to be limited with pain in glute muscle and ITB. Able to walk about 20 minutes on treadmill without pain.  PERTINENT HISTORY: Hx osteopenia, hx LBP, HLD  PAIN:  Are you having pain? Yes: NPRS scale: 2/10 Pain location: L glute/SIJ Pain description: sore Aggravating factors: bending Relieving factors: walking  PRECAUTIONS: None  WEIGHT BEARING RESTRICTIONS: No, WBAT post op  FALLS:  Has patient fallen in last 6  months? No    PLOF: Independent  PATIENT GOALS: get rid of hamstring pain.   OBJECTIVE: (objective measures from initial evaluation unless otherwise dated)   Extreme difficulty/unable (0), Quite a bit of difficulty (1), Moderate difficulty (2), Little difficulty (3), No difficulty (4) Survey date:   11/26/23 12/27/23  Any of your usual work, housework or school activities 1 3 3   2. Usual hobbies, recreational or sporting activities 0 2 4  3. Getting into/out of the bath 1 4 4   4. Walking between rooms 3 4 3   5. Putting on socks/shoes 1 3  2   6. Squatting  0 2 3  7. Lifting an object, like a bag of groceries from the floor 0 2 4  8. Performing light activities around your home 1 4 2   9. Performing heavy activities around your home 0 1 4  10. Getting into/out of a car 1 3 4   11. Walking 2 blocks 0 4 4  12. Walking 1 mile 0 4 4  13. Going up/down 10 stairs (1 flight) 0 2 4  14. Standing for 1 hour 0 4 4  15.  sitting for 1 hour 1 4 4   16. Running on even ground 0 0 4  17. Running on uneven ground 0 2 3  18. Making sharp turns while running fast 0 2 3  19. Hopping  0 2 4  20. Rolling over in bed 2 4 3   Score total:  11/80 56/80 70/80     POSTURE: No Significant postural limitations  PALPATION: TTP L glute max, piriformis, minimally at L hamstring origin   OBSERVATIONS   L LE significantly longer than R, sx and area of pain consistent with SIJ involvement     LOWER EXTREMITY MMT:  MMT Right eval Left eval Right 09/25/23 Left 09/25/23 Rt/Lt  10/20 Right 12/27/23 Left 12/27/23  Hip flexion 5 5    26  31.0  Hip extension 4+ 5 4+ 5  28.7 41.5  Hip abduction 4+ 5 5 5  38.4/42.1 42.2 43.5  Hip adduction         Hip internal rotation         Hip external rotation         Knee flexion 5 5   35.1/39.6    Knee extension 5 5   54.6/70+ 64.8 77+   (Blank rows = not tested) *= pain/symptoms   GAIT:  9/4 Comments:    TODAY'S TREATMENT:                                                                                                                              DATE:  12/27/23 Reassessment  RDL BW 2x10 Quadruped fire hydrant x10  12/20/23 STM to glutes/ITB Walking lunges x20 Side lunges x20 Hamstring dynamic stretch x20 High knee stretch x20 Calf raises x20 Light jog in place x20 seconds  Walking on treadmill x10 min  Grade II-III prone hip anterior glides  Gait training in hallway   12/13/23 Elliptical x6 min  Manual: STM to glutes, ITB ITB stretch in supine x1 min Lunges x10 Deep squat x10 Hip hinge  x10  SL balance in mirror 2x10 seconds  Education on pain science   12/05/23 Elliptical x6 min  Reciprocal stair navigation Thomas stretch x30 seconds bilat  Prone knee bend stretch x30 seconds bilat  RDLs 10# KB x10 Deep squats x10   11/26/23 PN & discussion of ADLs Tall kneel squat, also added blue tband at waist Half kneel chop across leg Dead lift progression through kettle bells and added squat Deep squat  11/20/23 Elliptical 7 min lvl 4  LF leg press x10 at 25#, 2x10 at 40# (seat lvl 8) Hip abduction machine 30# 3x10 Hip adduction machine 55# 3x10 Leg extension machine 3x10 25# (seat lvl 3, knee position 11, ankle position L) Quadruped rocking stretch 2x10 Lunges 3x10 B BKFO stretch 3x10  11/15/23 Elliptical x6 min  Lunges 2x6 bilaterally  Step ups 8 inch box 3x10 Squats 2x10 with no hand assistance RDL with 5# weight in both hands   Active hamstring stretch 2x10 with 5 second holds  Education on safe activity/exercise  11/13/23 LF bike lvl 5 x 5 min  Massage gun to R ITB, piriformis, glutes, and quads  Mini squats 3x10 Lateral walk with GTB x6 laps Step ups 6 inch box 2x10  RDL with 3# 2x10  11/09/23 STM to right piriformis, quads, iliopsoas Glute bridges 3x10 Prone hamstring curl 3# 3x10 LAQ 3x10 5#  Sit to stand 3x10 Step ups 4 inch step 2x10 Standing flexed on elbows- hip hike ball bw kneees Lateral stepping with GTB x5 laps down and back on rail Thomas stretch 3x30 seconds  Active hamstring stretch 3x30 seconds   11/06/23 STM to right piriformis  Glute bridges 3x10 Prone hamstring curls 2.5# 3x10 LAQ 3x10 3.5# Mini squats 2x10 Sit to stand 2x10 Step ups 4 inch step x10 Step taps 4 inch step x10 Supine active hamstring stretch x10 SLS 3x30 seconds   11/01/23 Standing flexed on elbows- hip hike ball bw kneees Sidelying 90/90 feet on wall- hip flexion rolling on ball bw knees- removed ball and added Rt adduction today STM Rt hip external  rotators, TFL Standing hip hinge  10/25/23 Qped rocking- hamstring engagement, ab set, neutral pelvic tilt Sidelying 90/90 feet on wall, clam, ball bw ankles- cues for rib cage lift & neutral pelvis to avoid HS overuse Sidelying 90/90 feet on wall- hip flexion rolling on ball bw knees Gait- reduction of ant drift of femoral head in Rt stance phase Seated for car: pelvis twist, isometric add/abd Heel raises with hips in neutral rather than drifting anteriorly  10/18/23 Dressing change Manual: STM hip flexors/quads/TFL on R Bridge 2 x 10 TA activation with bent knee fall outs 2 x 10 Prone hamstring curl 2 x 10 Prone hip ER/IR 2 x 10  Hip flexion stretch with non-surgical leg on table 2x10 with 5 second hold  10/11/23 Changed bandages See HEP Gait training to reduce anterior motion of hip ahead of trunk  PATIENT EDUCATION:  Education details: Anatomy of condition, POC, HEP, exercise form/rationale 11/15/23: education on proper/safe activity 11/20/23: education on relevant anatomy, gym routine, and activity tolerance   Person educated: Patient Education method: Programmer, Multimedia, Demonstration, and Handouts Education comprehension: verbalized understanding, returned demonstration, verbal cues required, and tactile cues required  HOME EXERCISE PROGRAM: Prehab- Access Code: [email or web address] URL: [email or web address]  Access Code: [email or web address] URL: [email or web address]  Date: 10/11/2023 Prepared by: Harlene Cordon    ASSESSMENT:  CLINICAL IMPRESSION: Patient has met 4/4 short term goals and 3/4 long term goals with ability to complete HEP and improvement in symptoms, strength, ROM, activity tolerance, gait, balance, and functional mobility. Remaining goals not met due to continued deficits in right lower extremity strength, functional mobility, activity tolerance, and pain. Patient has made good progress toward remaining goals. Extended POC for 6 weeks to further  progress strength and endurance and follow up with complaints of glute/ITB pain. Patient will continue to benefit from skilled physical therapy in order to improve function and reduce impairment.    OBJECTIVE IMPAIRMENTS: decreased activity tolerance, decreased balance, decreased endurance, decreased mobility, difficulty walking, decreased ROM, decreased strength, increased muscle spasms, impaired flexibility, improper body mechanics, and pain  ACTIVITY LIMITATIONS: lifting, bending, sitting, standing, squatting, stairs, transfers, locomotion level, and caring for others  PARTICIPATION LIMITATIONS: meal prep, cleaning, laundry, shopping, community activity, occupation, and yard work  PERSONAL FACTORS: 3+ comorbidities: Hx osteopenia, hx LBP, HLD, R hip pain are also affecting patient's functional outcome.   REHAB POTENTIAL: Good  CLINICAL DECISION MAKING: Evolving/moderate complexity  EVALUATION COMPLEXITY: Moderate   GOALS: Goals reviewed with patient? Yes  SHORT TERM GOALS: Target date: 4 weeks 9/27  Pain free flexion to 90 Baseline: Goal status: MET   2.  Demo controlled quad set with hold Baseline:  Goal status: MET   3.  30s sit to stand without pain or compensation Baseline:  Goal status: MET   4.  SLS 30s without compensation Baseline:  Goal status: MET    LONG TERM GOALS:  Able to demo step up on 6 step with level pelvis and proper form Baseline:  Goal status: MET 12/27/23  2.  Will tolerate at least 3 min on elliptical, demonstrating good tolerance to repetitive weight bearing motion Baseline:  Goal status: MET 12/27/23  3.  Demonstrate proper form in at least 10 continuous lunges without increased pain Baseline:  Goal status: In progress 12/27/23  4.  Demonstrate ability to get up and down from the floor without limitation by hip pain Baseline:  Goal status: MET 12/27/23  5. Patient will verbalize ability to tolerate 30 minutes on treadmill at home  at 3.0 speed  Baseline:  Goal status: INITIAL 12/27/23  6. Patient will be able to complete complete 20 kettle bell deadlifts without significant increase in pain to simulate bending down to pick up things off the ground and improve functional mobility with ADLs.  Baseline:  Goal status: INITIAL 12/27/23  7. Patient will improve HHD testing to within 90% of contralateral limb to indicate overall improved strength.  Baseline:  Goal status: INITIAL 12/27/23  PLAN:  PT FREQUENCY: 1-2x/week  PT DURATION: 6 weeks  PLANNED INTERVENTIONS: 97164- PT Re-evaluation, 97110-Therapeutic exercises, 97530- Therapeutic activity, 97112- Neuromuscular re-education, 97535- Self Care, [id number]- Manual therapy, [phone]- Gait training, [phone]- Orthotic Fit/training, [phone]- Canalith repositioning, [id number]- Aquatic Therapy, [phone]- Splinting, [phone]- Wound care (first 20 sq cm), 97598- Wound care (each additional 20 sq cm)Patient/Family education, Balance training, Stair training, Taping, Dry Needling, Joint mobilization, Joint manipulation, Spinal manipulation, Spinal mobilization, Scar mobilization, and DME instructions.  PLAN FOR NEXT SESSION: f/u with HEP. Hip mobility, manual for pain/mobility, glute/hamstring strength on L.    Lili Finder, Student-PT 12/27/2023, 1:58 PM  This entire session was performed under direct supervision and direction of a licensed therapist/therapist assistant . I have personally read, edited and approve of the note as 2:44 PM,  12/27/23 Prentice CANDIE Stains PT, DPT Physical Therapist at [facility]

## 2024-01-08 ENCOUNTER — Ambulatory Visit (HOSPITAL_BASED_OUTPATIENT_CLINIC_OR_DEPARTMENT_OTHER): Attending: Orthopedic Surgery | Admitting: Physical Therapy

## 2024-01-08 ENCOUNTER — Encounter (HOSPITAL_BASED_OUTPATIENT_CLINIC_OR_DEPARTMENT_OTHER): Payer: Self-pay | Admitting: Physical Therapy

## 2024-01-08 DIAGNOSIS — M25551 Pain in right hip: Secondary | ICD-10-CM | POA: Insufficient documentation

## 2024-01-08 DIAGNOSIS — R29898 Other symptoms and signs involving the musculoskeletal system: Secondary | ICD-10-CM | POA: Insufficient documentation

## 2024-01-08 DIAGNOSIS — S76302A Unspecified injury of muscle, fascia and tendon of the posterior muscle group at thigh level, left thigh, initial encounter: Secondary | ICD-10-CM | POA: Diagnosis present

## 2024-01-08 DIAGNOSIS — M6281 Muscle weakness (generalized): Secondary | ICD-10-CM | POA: Diagnosis present

## 2024-01-08 DIAGNOSIS — R2689 Other abnormalities of gait and mobility: Secondary | ICD-10-CM | POA: Insufficient documentation

## 2024-01-08 NOTE — Therapy (Signed)
 OUTPATIENT PHYSICAL THERAPY LOWER EXTREMITY TREATMENT     Patient Name: Carolyn Gutierrez MRN: [id number] DOB:11/15/1955, 68 y.o., female Today's Date: 01/08/2024  END OF SESSION:  PT End of Session - 01/08/24 1021     Visit Number 15    Number of Visits 21    Date for Recertification  02/07/24    Authorization Type Medicare    Progress Note Due on Visit 20    PT Start Time 1020    PT Stop Time 1058    PT Time Calculation (min) 38 min    Activity Tolerance Patient tolerated treatment well    Behavior During Therapy [facility] for tasks assessed/performed          Past Medical History:  Diagnosis Date   Allergy    Anxiety    Aortic atherosclerosis 03/30/2021   Asthma    in the past- no inhaler currently   Depression    Elevated blood pressure reading 03/30/2021   Genital warts    Hay fever    Migraine    Pure hypercholesterolemia 03/30/2021   Stress fracture of right foot    Past Surgical History:  Procedure Laterality Date   ABDOMINAL HYSTERECTOMY  01/17/2012   Procedure: HYSTERECTOMY ABDOMINAL;  Surgeon: Rosaline LITTIE Cobble, MD;  Location: [facility];  Service: Gynecology;  Laterality: N/A;   BREAST SURGERY     breast bx   COLONOSCOPY     11 yr ago with dr coleman ? a few polyps, no report   KNEE SURGERY     right   LAPAROSCOPIC ASSISTED VAGINAL HYSTERECTOMY  01/17/2012   Procedure: LAPAROSCOPIC ASSISTED VAGINAL HYSTERECTOMY;  Surgeon: Rosaline LITTIE Cobble, MD;  Location: [facility];  Service: Gynecology;  Laterality: N/A;  attempted    SALPINGOOPHORECTOMY  01/17/2012   Procedure: SALPINGO OOPHORECTOMY;  Surgeon: Rosaline LITTIE Cobble, MD;  Location: [facility];  Service: Gynecology;  Laterality: Right;   Patient Active Problem List   Diagnosis Date Noted   CAD in native artery 03/30/2021   Elevated blood pressure reading 03/30/2021   Pure hypercholesterolemia 03/30/2021   Aortic atherosclerosis 03/30/2021   Overweight (BMI 25.0-29.9) 08/20/2017   Vertigo 08/15/2017   Pulmonary nodule  04/30/2017   Acute bilateral low back pain without sciatica 04/30/2017   Plantar fasciitis, right 04/09/2017   Osteopenia 07/07/2015   Headache, migraine 03/30/2015   Delusional disorder (HCC) 11/17/2013    PCP: Burney Darice LITTIE, MD   REFERRING PROVIDER: Genelle Standing, MD  REFERRING DIAG:  left hamstring tendinitis; D23.697J (ICD-10-CM) - Left hamstring injury, initial encounter  M25.551 (ICD-10-CM) - Pain of right hip   S/p Rt hip labral repair  THERAPY DIAG:  Pain of right hip  Other abnormalities of gait and mobility  Other symptoms and signs involving the musculoskeletal system  Rationale for Evaluation and Treatment: Rehabilitation  ONSET DATE: June 2025 DOS 10/04/23  SUBJECTIVE:   SUBJECTIVE STATEMENT: Rt knee started hurting and Rt lateral hip gets stiff. The pain was up to 9/10 in knees, down to 3 on Rt now and on and off on the left.   PERTINENT HISTORY: Hx osteopenia, hx LBP, HLD  PAIN:  Are you having pain? Yes: NPRS scale: 2/10 Pain location: L glute/SIJ Pain description: sore Aggravating factors: bending Relieving factors: walking  PRECAUTIONS: None  WEIGHT BEARING RESTRICTIONS: No, WBAT post op  FALLS:  Has patient fallen in last 6 months? No    PLOF: Independent  PATIENT GOALS: get rid of hamstring pain.   OBJECTIVE: (objective  measures from initial evaluation unless otherwise dated)   Extreme difficulty/unable (0), Quite a bit of difficulty (1), Moderate difficulty (2), Little difficulty (3), No difficulty (4) Survey date:   11/26/23 12/27/23  Any of your usual work, housework or school activities 1 3 3   2. Usual hobbies, recreational or sporting activities 0 2 4  3. Getting into/out of the bath 1 4 4   4. Walking between rooms 3 4 3   5. Putting on socks/shoes 1 3 2   6. Squatting  0 2 3  7. Lifting an object, like a bag of groceries from the floor 0 2 4  8. Performing light activities around your home 1 4 2   9. Performing heavy  activities around your home 0 1 4  10. Getting into/out of a car 1 3 4   11. Walking 2 blocks 0 4 4  12. Walking 1 mile 0 4 4  13. Going up/down 10 stairs (1 flight) 0 2 4  14. Standing for 1 hour 0 4 4  15.  sitting for 1 hour 1 4 4   16. Running on even ground 0 0 4  17. Running on uneven ground 0 2 3  18. Making sharp turns while running fast 0 2 3  19. Hopping  0 2 4  20. Rolling over in bed 2 4 3   Score total:  11/80 56/80 70/80     POSTURE: No Significant postural limitations  PALPATION: TTP L glute max, piriformis, minimally at L hamstring origin   OBSERVATIONS   L LE significantly longer than R, sx and area of pain consistent with SIJ involvement     LOWER EXTREMITY MMT:  MMT Right eval Left eval Right 09/25/23 Left 09/25/23 Rt/Lt  10/20 Right 12/27/23 Left 12/27/23  Hip flexion 5 5    26  31.0  Hip extension 4+ 5 4+ 5  28.7 41.5  Hip abduction 4+ 5 5 5  38.4/42.1 42.2 43.5  Hip adduction         Hip internal rotation         Hip external rotation         Knee flexion 5 5   35.1/39.6    Knee extension 5 5   54.6/70+ 64.8 77+   (Blank rows = not tested) *= pain/symptoms    TODAY'S TREATMENT:                                                                                                                              DATE:  12/2 Standing elbows on table, ball behind knee, hip extension Standing hip flexor stretch with glut set Side stepping green tband Standing IT band stretch Standing diag chop in wide tandem Standing head lift/hip hinge Recumb bike L7 3 min cues for scooping with hamstrings and upright posture vs relaxing into chair  12/27/23 Reassessment  RDL BW 2x10 Quadruped fire hydrant x10  12/20/23 STM to glutes/ITB Walking lunges x20 Side lunges x20 Hamstring dynamic  stretch x20 High knee stretch x20 Calf raises x20 Light jog in place x20 seconds  Walking on treadmill x10 min  Grade II-III prone hip anterior glides  Gait training in hallway    12/13/23 Elliptical x6 min  Manual: STM to glutes, ITB ITB stretch in supine x1 min Lunges x10 Deep squat x10 Hip hinge x10  SL balance in mirror 2x10 seconds  Education on pain science   PATIENT EDUCATION:  Education details: Anatomy of condition, POC, HEP, exercise form/rationale 11/15/23: education on proper/safe activity 11/20/23: education on relevant anatomy, gym routine, and activity tolerance   Person educated: Patient Education method: Programmer, Multimedia, Demonstration, and Handouts Education comprehension: verbalized understanding, returned demonstration, verbal cues required, and tactile cues required  HOME EXERCISE PROGRAM: Prehab- Access Code: [email or web address] URL: [email or web address]  Access Code: [email or web address] URL: [email or web address] Date: 10/11/2023 Prepared by: Harlene Cordon    ASSESSMENT:  CLINICAL IMPRESSION: S/s consistent with ITB tendinitis- discussed anatomy of condition. Exercises modified to activate proper musculature, stretch lateral trunk and leg and remove painful pressure. Pt reported feeling better at the end of the session. Requested that she  utilize bike or walking on solid ground vs treadmill for a while and hamstring activation is limited.   OBJECTIVE IMPAIRMENTS: decreased activity tolerance, decreased balance, decreased endurance, decreased mobility, difficulty walking, decreased ROM, decreased strength, increased muscle spasms, impaired flexibility, improper body mechanics, and pain  ACTIVITY LIMITATIONS: lifting, bending, sitting, standing, squatting, stairs, transfers, locomotion level, and caring for others  PARTICIPATION LIMITATIONS: meal prep, cleaning, laundry, shopping, community activity, occupation, and yard work  PERSONAL FACTORS: 3+ comorbidities: Hx osteopenia, hx LBP, HLD, R hip pain are also affecting patient's functional outcome.   REHAB POTENTIAL: Good  CLINICAL DECISION MAKING: Evolving/moderate  complexity  EVALUATION COMPLEXITY: Moderate   GOALS: Goals reviewed with patient? Yes  SHORT TERM GOALS: Target date: 4 weeks 9/27  Pain free flexion to 90 Baseline: Goal status: MET   2.  Demo controlled quad set with hold Baseline:  Goal status: MET   3.  30s sit to stand without pain or compensation Baseline:  Goal status: MET   4.  SLS 30s without compensation Baseline:  Goal status: MET    LONG TERM GOALS:  Able to demo step up on 6 step with level pelvis and proper form Baseline:  Goal status: MET 12/27/23  2.  Will tolerate at least 3 min on elliptical, demonstrating good tolerance to repetitive weight bearing motion Baseline:  Goal status: MET 12/27/23  3.  Demonstrate proper form in at least 10 continuous lunges without increased pain Baseline:  Goal status: In progress 12/27/23  4.  Demonstrate ability to get up and down from the floor without limitation by hip pain Baseline:  Goal status: MET 12/27/23  5. Patient will verbalize ability to tolerate 30 minutes on treadmill at home at 3.0 speed  Baseline:  Goal status: INITIAL 12/27/23  6. Patient will be able to complete complete 20 kettle bell deadlifts without significant increase in pain to simulate bending down to pick up things off the ground and improve functional mobility with ADLs.  Baseline:  Goal status: INITIAL 12/27/23  7. Patient will improve HHD testing to within 90% of contralateral limb to indicate overall improved strength.  Baseline:  Goal status: INITIAL 12/27/23  PLAN:  PT FREQUENCY: 1-2x/week  PT DURATION: 6 weeks  PLANNED INTERVENTIONS: 97164- PT Re-evaluation, 97110-Therapeutic exercises, 97530- Therapeutic activity, 97112- Neuromuscular re-education, 97535- Self Care, 02859- Manual therapy, [id number]- Gait training, [id number]- Orthotic  Fit/training, 04007- Canalith repositioning, 02886- Aquatic Therapy, [id number]- Splinting, 02402- Wound care (first 20 sq cm), 97598- Wound care  (each additional 20 sq cm)Patient/Family education, Balance training, Stair training, Taping, Dry Needling, Joint mobilization, Joint manipulation, Spinal manipulation, Spinal mobilization, Scar mobilization, and DME instructions.  PLAN FOR NEXT SESSION: f/u with HEP. Hip mobility, manual for pain/mobility, glute/hamstring strength on L.    Harlene Cordon, PT, DPT 01/08/2024, 1:41 PM

## 2024-01-16 ENCOUNTER — Ambulatory Visit (HOSPITAL_BASED_OUTPATIENT_CLINIC_OR_DEPARTMENT_OTHER): Admitting: Physical Therapy

## 2024-01-16 ENCOUNTER — Encounter (HOSPITAL_BASED_OUTPATIENT_CLINIC_OR_DEPARTMENT_OTHER): Payer: Self-pay | Admitting: Physical Therapy

## 2024-01-16 DIAGNOSIS — M25551 Pain in right hip: Secondary | ICD-10-CM | POA: Diagnosis not present

## 2024-01-16 DIAGNOSIS — M6281 Muscle weakness (generalized): Secondary | ICD-10-CM

## 2024-01-16 DIAGNOSIS — R2689 Other abnormalities of gait and mobility: Secondary | ICD-10-CM

## 2024-01-16 DIAGNOSIS — R29898 Other symptoms and signs involving the musculoskeletal system: Secondary | ICD-10-CM

## 2024-01-16 NOTE — Therapy (Signed)
 OUTPATIENT PHYSICAL THERAPY LOWER EXTREMITY TREATMENT     Patient Name: Carolyn Gutierrez MRN: [id number] DOB:[birth year], 68 y.o., female Today's Date: 01/16/2024  END OF SESSION:  PT End of Session - 01/16/24 0802     Visit Number 16    Number of Visits 21    Date for Recertification  02/07/24    Authorization Type Medicare    Progress Note Due on Visit 20    PT Start Time 0802    PT Stop Time 0842    PT Time Calculation (min) 40 min    Activity Tolerance Patient tolerated treatment well    Behavior During Therapy [facility] for tasks assessed/performed          Past Medical History:  Diagnosis Date   Allergy    Anxiety    Aortic atherosclerosis 03/30/2021   Asthma    in the past- no inhaler currently   Depression    Elevated blood pressure reading 03/30/2021   Genital warts    Hay fever    Migraine    Pure hypercholesterolemia 03/30/2021   Stress fracture of right foot    Past Surgical History:  Procedure Laterality Date   ABDOMINAL HYSTERECTOMY  01/17/2012   Procedure: HYSTERECTOMY ABDOMINAL;  Surgeon: Rosaline LITTIE Cobble, MD;  Location: [facility];  Service: Gynecology;  Laterality: N/A;   BREAST SURGERY     breast bx   COLONOSCOPY     11 yr ago with dr coleman ? a few polyps, no report   KNEE SURGERY     right   LAPAROSCOPIC ASSISTED VAGINAL HYSTERECTOMY  01/17/2012   Procedure: LAPAROSCOPIC ASSISTED VAGINAL HYSTERECTOMY;  Surgeon: Rosaline LITTIE Cobble, MD;  Location: [facility];  Service: Gynecology;  Laterality: N/A;  attempted    SALPINGOOPHORECTOMY  01/17/2012   Procedure: SALPINGO OOPHORECTOMY;  Surgeon: Rosaline LITTIE Cobble, MD;  Location: [facility];  Service: Gynecology;  Laterality: Right;   Patient Active Problem List   Diagnosis Date Noted   CAD in native artery 03/30/2021   Elevated blood pressure reading 03/30/2021   Pure hypercholesterolemia 03/30/2021   Aortic atherosclerosis 03/30/2021   Overweight (BMI 25.0-29.9) 08/20/2017   Vertigo 08/15/2017   Pulmonary nodule  04/30/2017   Acute bilateral low back pain without sciatica 04/30/2017   Plantar fasciitis, right 04/09/2017   Osteopenia 07/07/2015   Headache, migraine 03/30/2015   Delusional disorder (HCC) 11/17/2013    PCP: Burney Darice LITTIE, MD   REFERRING PROVIDER: Genelle Standing, MD  REFERRING DIAG:  left hamstring tendinitis; D23.697J (ICD-10-CM) - Left hamstring injury, initial encounter  M25.551 (ICD-10-CM) - Pain of right hip   S/p Rt hip labral repair  THERAPY DIAG:  Pain of right hip  Other abnormalities of gait and mobility  Other symptoms and signs involving the musculoskeletal system  Muscle weakness (generalized)  Rationale for Evaluation and Treatment: Rehabilitation  ONSET DATE: June 2025 DOS 10/04/23  SUBJECTIVE:   SUBJECTIVE STATEMENT: Patient states knees are hurting, about a 5/10 today. Intermittent swelling. Aches behind the knee.   PERTINENT HISTORY: Hx osteopenia, hx LBP, HLD  PAIN:  Are you having pain? Yes: NPRS scale: 2/10 Pain location: L glute/SIJ Pain description: sore Aggravating factors: bending Relieving factors: walking  PRECAUTIONS: None  WEIGHT BEARING RESTRICTIONS: No, WBAT post op  FALLS:  Has patient fallen in last 6 months? No    PLOF: Independent  PATIENT GOALS: get rid of hamstring pain.   OBJECTIVE: (objective measures from initial evaluation unless otherwise dated)   Extreme difficulty/unable (0),  Quite a bit of difficulty (1), Moderate difficulty (2), Little difficulty (3), No difficulty (4) Survey date:   11/26/23 12/27/23  Any of your usual work, housework or school activities 1 3 3   2. Usual hobbies, recreational or sporting activities 0 2 4  3. Getting into/out of the bath 1 4 4   4. Walking between rooms 3 4 3   5. Putting on socks/shoes 1 3 2   6. Squatting  0 2 3  7. Lifting an object, like a bag of groceries from the floor 0 2 4  8. Performing light activities around your home 1 4 2   9. Performing heavy  activities around your home 0 1 4  10. Getting into/out of a car 1 3 4   11. Walking 2 blocks 0 4 4  12. Walking 1 mile 0 4 4  13. Going up/down 10 stairs (1 flight) 0 2 4  14. Standing for 1 hour 0 4 4  15.  sitting for 1 hour 1 4 4   16. Running on even ground 0 0 4  17. Running on uneven ground 0 2 3  18. Making sharp turns while running fast 0 2 3  19. Hopping  0 2 4  20. Rolling over in bed 2 4 3   Score total:  11/80 56/80 70/80     POSTURE: No Significant postural limitations  PALPATION: TTP L glute max, piriformis, minimally at L hamstring origin   OBSERVATIONS   L LE significantly longer than R, sx and area of pain consistent with SIJ involvement     LOWER EXTREMITY MMT:  MMT Right eval Left eval Right 09/25/23 Left 09/25/23 Rt/Lt  10/20 Right 12/27/23 Left 12/27/23  Hip flexion 5 5    26  31.0  Hip extension 4+ 5 4+ 5  28.7 41.5  Hip abduction 4+ 5 5 5  38.4/42.1 42.2 43.5  Hip adduction         Hip internal rotation         Hip external rotation         Knee flexion 5 5   35.1/39.6    Knee extension 5 5   54.6/70+ 64.8 77+   (Blank rows = not tested) *= pain/symptoms    TODAY'S TREATMENT:                                                                                                                              DATE:  01/16/24 Recumb bike L7 5 min  Manual: patellar mobs  Standing dead lift 5# 2 x 10  SL RDL 2 x 10  SL mini squat slide out 3 x 8 3 way Standing lift GTB 2 x 10  12/2 Standing elbows on table, ball behind knee, hip extension Standing hip flexor stretch with glut set Side stepping green tband Standing IT band stretch Standing diag chop in wide tandem Standing head lift/hip hinge Recumb bike L7 3 min cues for scooping with hamstrings and  upright posture vs relaxing into chair  12/27/23 Reassessment  RDL BW 2x10 Quadruped fire hydrant x10  12/20/23 STM to glutes/ITB Walking lunges x20 Side lunges x20 Hamstring dynamic stretch  x20 High knee stretch x20 Calf raises x20 Light jog in place x20 seconds  Walking on treadmill x10 min  Grade II-III prone hip anterior glides  Gait training in hallway   12/13/23 Elliptical x6 min  Manual: STM to glutes, ITB ITB stretch in supine x1 min Lunges x10 Deep squat x10 Hip hinge x10  SL balance in mirror 2x10 seconds  Education on pain science   PATIENT EDUCATION:  Education details: Anatomy of condition, POC, HEP, exercise form/rationale 11/15/23: education on proper/safe activity 11/20/23: education on relevant anatomy, gym routine, and activity tolerance   Person educated: Patient Education method: Programmer, Multimedia, Demonstration, and Handouts Education comprehension: verbalized understanding, returned demonstration, verbal cues required, and tactile cues required  HOME EXERCISE PROGRAM: Prehab- Access Code: [email or web address] URL: [email or web address]  Access Code: [email or web address] URL: [email or web address] Date: 10/11/2023 Prepared by: Harlene Cordon    ASSESSMENT:  CLINICAL IMPRESSION: Began session on bike for dynamic warm with cueing for hamstring activation. Patient with hypomobile patella and edema at fat pad region R>L. Mobility and symptoms improve to 3/10 with manual. Cueing throughout session for motor control, mechanics, and glute activation. Continued with glute, hamstring and core work today. Patient will continue to benefit from physical therapy in order to improve function and reduce impairment.     OBJECTIVE IMPAIRMENTS: decreased activity tolerance, decreased balance, decreased endurance, decreased mobility, difficulty walking, decreased ROM, decreased strength, increased muscle spasms, impaired flexibility, improper body mechanics, and pain  ACTIVITY LIMITATIONS: lifting, bending, sitting, standing, squatting, stairs, transfers, locomotion level, and caring for others  PARTICIPATION LIMITATIONS: meal prep, cleaning, laundry,  shopping, community activity, occupation, and yard work  PERSONAL FACTORS: 3+ comorbidities: Hx osteopenia, hx LBP, HLD, R hip pain are also affecting patient's functional outcome.   REHAB POTENTIAL: Good  CLINICAL DECISION MAKING: Evolving/moderate complexity  EVALUATION COMPLEXITY: Moderate   GOALS: Goals reviewed with patient? Yes  SHORT TERM GOALS: Target date: 4 weeks 9/27  Pain free flexion to 90 Baseline: Goal status: MET   2.  Demo controlled quad set with hold Baseline:  Goal status: MET   3.  30s sit to stand without pain or compensation Baseline:  Goal status: MET   4.  SLS 30s without compensation Baseline:  Goal status: MET    LONG TERM GOALS:  Able to demo step up on 6 step with level pelvis and proper form Baseline:  Goal status: MET 12/27/23  2.  Will tolerate at least 3 min on elliptical, demonstrating good tolerance to repetitive weight bearing motion Baseline:  Goal status: MET 12/27/23  3.  Demonstrate proper form in at least 10 continuous lunges without increased pain Baseline:  Goal status: In progress 12/27/23  4.  Demonstrate ability to get up and down from the floor without limitation by hip pain Baseline:  Goal status: MET 12/27/23  5. Patient will verbalize ability to tolerate 30 minutes on treadmill at home at 3.0 speed  Baseline:  Goal status: INITIAL 12/27/23  6. Patient will be able to complete complete 20 kettle bell deadlifts without significant increase in pain to simulate bending down to pick up things off the ground and improve functional mobility with ADLs.  Baseline:  Goal status: INITIAL 12/27/23  7. Patient will improve HHD testing to within 90% of contralateral limb to indicate overall improved strength.  Baseline:  Goal status: INITIAL 12/27/23  PLAN:  PT FREQUENCY: 1-2x/week  PT DURATION: 6 weeks  PLANNED INTERVENTIONS: 97164- PT Re-evaluation, 97110-Therapeutic exercises, 97530- Therapeutic activity,  97112- Neuromuscular re-education, 97535- Self Care, 02859- Manual therapy, [phone]- Gait training, [phone]- Orthotic Fit/training, [phone]- Canalith repositioning, [id number]- Aquatic Therapy, [phone]- Splinting, [phone]- Wound care (first 20 sq cm), 97598- Wound care (each additional 20 sq cm)Patient/Family education, Balance training, Stair training, Taping, Dry Needling, Joint mobilization, Joint manipulation, Spinal manipulation, Spinal mobilization, Scar mobilization, and DME instructions.  PLAN FOR NEXT SESSION: f/u with HEP. Hip mobility, manual for pain/mobility, glute/hamstring strength on L.    Prentice RAMAN Jarika Robben, PT, DPT 01/16/2024, 8:02 AM

## 2024-01-22 ENCOUNTER — Ambulatory Visit (HOSPITAL_BASED_OUTPATIENT_CLINIC_OR_DEPARTMENT_OTHER): Payer: Self-pay | Admitting: Physical Therapy

## 2024-01-22 DIAGNOSIS — M25551 Pain in right hip: Secondary | ICD-10-CM | POA: Diagnosis not present

## 2024-01-22 DIAGNOSIS — R2689 Other abnormalities of gait and mobility: Secondary | ICD-10-CM

## 2024-01-22 DIAGNOSIS — M6281 Muscle weakness (generalized): Secondary | ICD-10-CM

## 2024-01-22 DIAGNOSIS — R29898 Other symptoms and signs involving the musculoskeletal system: Secondary | ICD-10-CM

## 2024-01-22 NOTE — Therapy (Signed)
 OUTPATIENT PHYSICAL THERAPY LOWER EXTREMITY TREATMENT     Patient Name: Carolyn Gutierrez MRN: [id number] DOB:02-10-1955, 68 y.o., female Today's Date: 01/22/2024  END OF SESSION:  PT End of Session - 01/22/24 1105     Visit Number 17    Number of Visits 21    Date for Recertification  02/07/24    Authorization Type Medicare    Progress Note Due on Visit 20    PT Start Time 1103    PT Stop Time 1143    PT Time Calculation (min) 40 min    Activity Tolerance Patient tolerated treatment well    Behavior During Therapy [facility] for tasks assessed/performed          Past Medical History:  Diagnosis Date   Allergy    Anxiety    Aortic atherosclerosis 03/30/2021   Asthma    in the past- no inhaler currently   Depression    Elevated blood pressure reading 03/30/2021   Genital warts    Hay fever    Migraine    Pure hypercholesterolemia 03/30/2021   Stress fracture of right foot    Past Surgical History:  Procedure Laterality Date   ABDOMINAL HYSTERECTOMY  01/17/2012   Procedure: HYSTERECTOMY ABDOMINAL;  Surgeon: Rosaline LITTIE Cobble, MD;  Location: [facility];  Service: Gynecology;  Laterality: N/A;   BREAST SURGERY     breast bx   COLONOSCOPY     11 yr ago with dr coleman ? a few polyps, no report   KNEE SURGERY     right   LAPAROSCOPIC ASSISTED VAGINAL HYSTERECTOMY  01/17/2012   Procedure: LAPAROSCOPIC ASSISTED VAGINAL HYSTERECTOMY;  Surgeon: Rosaline LITTIE Cobble, MD;  Location: [facility];  Service: Gynecology;  Laterality: N/A;  attempted    SALPINGOOPHORECTOMY  01/17/2012   Procedure: SALPINGO OOPHORECTOMY;  Surgeon: Rosaline LITTIE Cobble, MD;  Location: [facility];  Service: Gynecology;  Laterality: Right;   Patient Active Problem List   Diagnosis Date Noted   CAD in native artery 03/30/2021   Elevated blood pressure reading 03/30/2021   Pure hypercholesterolemia 03/30/2021   Aortic atherosclerosis 03/30/2021   Overweight (BMI 25.0-29.9) 08/20/2017   Vertigo 08/15/2017   Pulmonary nodule  04/30/2017   Acute bilateral low back pain without sciatica 04/30/2017   Plantar fasciitis, right 04/09/2017   Osteopenia 07/07/2015   Headache, migraine 03/30/2015   Delusional disorder (HCC) 11/17/2013    PCP: Burney Darice LITTIE, MD   REFERRING PROVIDER: Genelle Standing, MD  REFERRING DIAG:  left hamstring tendinitis; D23.697J (ICD-10-CM) - Left hamstring injury, initial encounter  M25.551 (ICD-10-CM) - Pain of right hip   S/p Rt hip labral repair  THERAPY DIAG:  Pain of right hip  Other abnormalities of gait and mobility  Other symptoms and signs involving the musculoskeletal system  Muscle weakness (generalized)  Rationale for Evaluation and Treatment: Rehabilitation  ONSET DATE: June 2025 DOS 10/04/23  SUBJECTIVE:   SUBJECTIVE STATEMENT: Patient states knee swelling has gone down. Some hip soreness in anterior/lateral hip. HEP going well. Wants to practice getting up and down from floor.   PERTINENT HISTORY: Hx osteopenia, hx LBP, HLD  PAIN:  Are you having pain? Yes: NPRS scale: 2/10 Pain location: L glute/SIJ Pain description: sore Aggravating factors: bending Relieving factors: walking  PRECAUTIONS: None  WEIGHT BEARING RESTRICTIONS: No, WBAT post op  FALLS:  Has patient fallen in last 6 months? No    PLOF: Independent  PATIENT GOALS: get rid of hamstring pain.   OBJECTIVE: (objective measures from  initial evaluation unless otherwise dated)   Extreme difficulty/unable (0), Quite a bit of difficulty (1), Moderate difficulty (2), Little difficulty (3), No difficulty (4) Survey date:   11/26/23 12/27/23  Any of your usual work, housework or school activities 1 3 3   2. Usual hobbies, recreational or sporting activities 0 2 4  3. Getting into/out of the bath 1 4 4   4. Walking between rooms 3 4 3   5. Putting on socks/shoes 1 3 2   6. Squatting  0 2 3  7. Lifting an object, like a bag of groceries from the floor 0 2 4  8. Performing light activities  around your home 1 4 2   9. Performing heavy activities around your home 0 1 4  10. Getting into/out of a car 1 3 4   11. Walking 2 blocks 0 4 4  12. Walking 1 mile 0 4 4  13. Going up/down 10 stairs (1 flight) 0 2 4  14. Standing for 1 hour 0 4 4  15.  sitting for 1 hour 1 4 4   16. Running on even ground 0 0 4  17. Running on uneven ground 0 2 3  18. Making sharp turns while running fast 0 2 3  19. Hopping  0 2 4  20. Rolling over in bed 2 4 3   Score total:  11/80 56/80 70/80     POSTURE: No Significant postural limitations  PALPATION: TTP L glute max, piriformis, minimally at L hamstring origin   OBSERVATIONS   L LE significantly longer than R, sx and area of pain consistent with SIJ involvement     LOWER EXTREMITY MMT:  MMT Right eval Left eval Right 09/25/23 Left 09/25/23 Rt/Lt  10/20 Right 12/27/23 Left 12/27/23  Hip flexion 5 5    26  31.0  Hip extension 4+ 5 4+ 5  28.7 41.5  Hip abduction 4+ 5 5 5  38.4/42.1 42.2 43.5  Hip adduction         Hip internal rotation         Hip external rotation         Knee flexion 5 5   35.1/39.6    Knee extension 5 5   54.6/70+ 64.8 77+   (Blank rows = not tested) *= pain/symptoms    TODAY'S TREATMENT:                                                                                                                              DATE:  01/22/24 Floor to stand transfers Squat 2 x 10 Standing dead lift 5# 2 x 10  SL RDL 2 x 10  Small curtsy lunge 2 x 10 Mini SL hip hinge with alt UE reach 10 x 5  01/16/24 Recumb bike L7 5 min  Manual: patellar mobs  Standing dead lift 5# 2 x 10  SL RDL 2 x 10  SL mini squat slide out 3 x 8 3 way Standing lift  GTB 2 x 10  12/2 Standing elbows on table, ball behind knee, hip extension Standing hip flexor stretch with glut set Side stepping green tband Standing IT band stretch Standing diag chop in wide tandem Standing head lift/hip hinge Recumb bike L7 3 min cues for scooping with  hamstrings and upright posture vs relaxing into chair  12/27/23 Reassessment  RDL BW 2x10 Quadruped fire hydrant x10  12/20/23 STM to glutes/ITB Walking lunges x20 Side lunges x20 Hamstring dynamic stretch x20 High knee stretch x20 Calf raises x20 Light jog in place x20 seconds  Walking on treadmill x10 min  Grade II-III prone hip anterior glides  Gait training in hallway   12/13/23 Elliptical x6 min  Manual: STM to glutes, ITB ITB stretch in supine x1 min Lunges x10 Deep squat x10 Hip hinge x10  SL balance in mirror 2x10 seconds  Education on pain science   PATIENT EDUCATION:  Education details: Anatomy of condition, POC, HEP, exercise form/rationale 11/15/23: education on proper/safe activity 11/20/23: education on relevant anatomy, gym routine, and activity tolerance   Person educated: Patient Education method: Programmer, Multimedia, Demonstration, and Handouts Education comprehension: verbalized understanding, returned demonstration, verbal cues required, and tactile cues required  HOME EXERCISE PROGRAM: Prehab- Access Code: [email or web address] URL: [email or web address]  Access Code: [email or web address] URL: [email or web address] Date: 10/11/2023 Prepared by: Harlene Cordon    ASSESSMENT:  CLINICAL IMPRESSION: Reviewed floor to standing transfers as patient wanted to review. Continued with glute and quad strengthening with education on mechanics as needed. Will likely d/c in next several sessions as she continues to progress well.  Patient will continue to benefit from physical therapy in order to improve function and reduce impairment.     OBJECTIVE IMPAIRMENTS: decreased activity tolerance, decreased balance, decreased endurance, decreased mobility, difficulty walking, decreased ROM, decreased strength, increased muscle spasms, impaired flexibility, improper body mechanics, and pain  ACTIVITY LIMITATIONS: lifting, bending, sitting, standing, squatting,  stairs, transfers, locomotion level, and caring for others  PARTICIPATION LIMITATIONS: meal prep, cleaning, laundry, shopping, community activity, occupation, and yard work  PERSONAL FACTORS: 3+ comorbidities: Hx osteopenia, hx LBP, HLD, R hip pain are also affecting patient's functional outcome.   REHAB POTENTIAL: Good  CLINICAL DECISION MAKING: Evolving/moderate complexity  EVALUATION COMPLEXITY: Moderate   GOALS: Goals reviewed with patient? Yes  SHORT TERM GOALS: Target date: 4 weeks 9/27  Pain free flexion to 90 Baseline: Goal status: MET   2.  Demo controlled quad set with hold Baseline:  Goal status: MET   3.  30s sit to stand without pain or compensation Baseline:  Goal status: MET   4.  SLS 30s without compensation Baseline:  Goal status: MET    LONG TERM GOALS:  Able to demo step up on 6 step with level pelvis and proper form Baseline:  Goal status: MET 12/27/23  2.  Will tolerate at least 3 min on elliptical, demonstrating good tolerance to repetitive weight bearing motion Baseline:  Goal status: MET 12/27/23  3.  Demonstrate proper form in at least 10 continuous lunges without increased pain Baseline:  Goal status: In progress 12/27/23  4.  Demonstrate ability to get up and down from the floor without limitation by hip pain Baseline:  Goal status: MET 12/27/23  5. Patient will verbalize ability to tolerate 30 minutes on treadmill at home at 3.0 speed  Baseline:  Goal status: INITIAL 12/27/23  6. Patient will be able to complete complete 20 kettle bell deadlifts without significant increase in pain to simulate bending down to  pick up things off the ground and improve functional mobility with ADLs.  Baseline:  Goal status: INITIAL 12/27/23  7. Patient will improve HHD testing to within 90% of contralateral limb to indicate overall improved strength.  Baseline:  Goal status: INITIAL 12/27/23  PLAN:  PT FREQUENCY: 1-2x/week  PT DURATION: 6  weeks  PLANNED INTERVENTIONS: 97164- PT Re-evaluation, 97110-Therapeutic exercises, 97530- Therapeutic activity, 97112- Neuromuscular re-education, 97535- Self Care, 02859- Manual therapy, [phone]- Gait training, [phone]- Orthotic Fit/training, [phone]- Canalith repositioning, [id number]- Aquatic Therapy, [phone]- Splinting, [phone]- Wound care (first 20 sq cm), 97598- Wound care (each additional 20 sq cm)Patient/Family education, Balance training, Stair training, Taping, Dry Needling, Joint mobilization, Joint manipulation, Spinal manipulation, Spinal mobilization, Scar mobilization, and DME instructions.  PLAN FOR NEXT SESSION: f/u with HEP. Hip mobility, manual for pain/mobility, glute/hamstring strength on L.    Prentice RAMAN Shean Gerding, PT, DPT 01/22/2024, 11:43 AM

## 2024-01-30 ENCOUNTER — Encounter (HOSPITAL_BASED_OUTPATIENT_CLINIC_OR_DEPARTMENT_OTHER): Payer: Self-pay | Admitting: Physical Therapy

## 2024-02-06 ENCOUNTER — Encounter (HOSPITAL_BASED_OUTPATIENT_CLINIC_OR_DEPARTMENT_OTHER): Payer: Self-pay | Admitting: Physical Therapy

## 2024-02-06 ENCOUNTER — Ambulatory Visit (HOSPITAL_BASED_OUTPATIENT_CLINIC_OR_DEPARTMENT_OTHER): Admitting: Physical Therapy

## 2024-02-06 DIAGNOSIS — S76302A Unspecified injury of muscle, fascia and tendon of the posterior muscle group at thigh level, left thigh, initial encounter: Secondary | ICD-10-CM

## 2024-02-06 DIAGNOSIS — M25551 Pain in right hip: Secondary | ICD-10-CM | POA: Diagnosis not present

## 2024-02-06 DIAGNOSIS — M6281 Muscle weakness (generalized): Secondary | ICD-10-CM

## 2024-02-06 DIAGNOSIS — R29898 Other symptoms and signs involving the musculoskeletal system: Secondary | ICD-10-CM

## 2024-02-06 DIAGNOSIS — R2689 Other abnormalities of gait and mobility: Secondary | ICD-10-CM

## 2024-02-06 NOTE — Therapy (Signed)
 " OUTPATIENT PHYSICAL THERAPY LOWER EXTREMITY TREATMENT     Patient Name: Carolyn Gutierrez MRN: [id number] DOB:October 11, 1955, 68 y.o., female Today's Date: 02/06/2024  Progress Note   Reporting Period 12/27/23 to 02/06/24   See note below for Objective Data and Assessment of Progress/Goals  PHYSICAL THERAPY DISCHARGE SUMMARY  Visits from Start of Care: 18  Current functional level related to goals / functional outcomes: See below   Remaining deficits: See below   Education / Equipment: See below   Patient agrees to discharge. Patient goals were met. Patient is being discharged due to being pleased with the current functional level.   END OF SESSION:  PT End of Session - 02/06/24 0720     Visit Number 18    Number of Visits 21    Date for Recertification  02/07/24    Authorization Type Medicare    Progress Note Due on Visit 20    PT Start Time 0717    PT Stop Time 0755    PT Time Calculation (min) 38 min    Activity Tolerance Patient tolerated treatment well    Behavior During Therapy [facility] for tasks assessed/performed          Past Medical History:  Diagnosis Date   Allergy    Anxiety    Aortic atherosclerosis 03/30/2021   Asthma    in the past- no inhaler currently   Depression    Elevated blood pressure reading 03/30/2021   Genital warts    Hay fever    Migraine    Pure hypercholesterolemia 03/30/2021   Stress fracture of right foot    Past Surgical History:  Procedure Laterality Date   ABDOMINAL HYSTERECTOMY  01/17/2012   Procedure: HYSTERECTOMY ABDOMINAL;  Surgeon: Rosaline LITTIE Cobble, MD;  Location: [facility];  Service: Gynecology;  Laterality: N/A;   BREAST SURGERY     breast bx   COLONOSCOPY     11 yr ago with dr coleman ? a few polyps, no report   KNEE SURGERY     right   LAPAROSCOPIC ASSISTED VAGINAL HYSTERECTOMY  01/17/2012   Procedure: LAPAROSCOPIC ASSISTED VAGINAL HYSTERECTOMY;  Surgeon: Rosaline LITTIE Cobble, MD;  Location: [facility];  Service:  Gynecology;  Laterality: N/A;  attempted    SALPINGOOPHORECTOMY  01/17/2012   Procedure: SALPINGO OOPHORECTOMY;  Surgeon: Rosaline LITTIE Cobble, MD;  Location: [facility];  Service: Gynecology;  Laterality: Right;   Patient Active Problem List   Diagnosis Date Noted   CAD in native artery 03/30/2021   Elevated blood pressure reading 03/30/2021   Pure hypercholesterolemia 03/30/2021   Aortic atherosclerosis 03/30/2021   Overweight (BMI 25.0-29.9) 08/20/2017   Vertigo 08/15/2017   Pulmonary nodule 04/30/2017   Acute bilateral low back pain without sciatica 04/30/2017   Plantar fasciitis, right 04/09/2017   Osteopenia 07/07/2015   Headache, migraine 03/30/2015   Delusional disorder (HCC) 11/17/2013    PCP: Burney Darice LITTIE, MD   REFERRING PROVIDER: Genelle Standing, MD  REFERRING DIAG:  left hamstring tendinitis; D23.697J (ICD-10-CM) - Left hamstring injury, initial encounter  M25.551 (ICD-10-CM) - Pain of right hip   S/p Rt hip labral repair  THERAPY DIAG:  Pain of right hip  Other abnormalities of gait and mobility  Other symptoms and signs involving the musculoskeletal system  Muscle weakness (generalized)  Left hamstring injury, initial encounter  Rationale for Evaluation and Treatment: Rehabilitation  ONSET DATE: June 2025 DOS 10/04/23  SUBJECTIVE:   SUBJECTIVE STATEMENT: Patient states no knee swelling but some soreness at  the knee caps. Hip pain that she was coming for is gone. HEP going well. Doing HEP. She feels 100% improvement/functional status. Initial hip pain is gone. Feels she could probably walk 30-35 minutes. Has been using bike.   PERTINENT HISTORY: Hx osteopenia, hx LBP, HLD  PAIN:  Are you having pain? Yes: NPRS scale: 0/10 Pain location: L glute/SIJ Pain description: sore Aggravating factors: bending Relieving factors: walking  PRECAUTIONS: None  WEIGHT BEARING RESTRICTIONS: No, WBAT post op  FALLS:  Has patient fallen in last 6 months?  No    PLOF: Independent  PATIENT GOALS: get rid of hamstring pain.   OBJECTIVE: (objective measures from initial evaluation unless otherwise dated)   Extreme difficulty/unable (0), Quite a bit of difficulty (1), Moderate difficulty (2), Little difficulty (3), No difficulty (4) Survey date:   11/26/23 12/27/23 02/06/24  Any of your usual work, housework or school activities 1 3 3 4   2. Usual hobbies, recreational or sporting activities 0 2 4 4   3. Getting into/out of the bath 1 4 4 4   4. Walking between rooms 3 4 3 4   5. Putting on socks/shoes 1 3 2 4   6. Squatting  0 2 3 4   7. Lifting an object, like a bag of groceries from the floor 0 2 4 4   8. Performing light activities around your home 1 4 2 4   9. Performing heavy activities around your home 0 1 4 4   10. Getting into/out of a car 1 3 4 4   11. Walking 2 blocks 0 4 4 4   12. Walking 1 mile 0 4 4 4   13. Going up/down 10 stairs (1 flight) 0 2 4 4   14. Standing for 1 hour 0 4 4 4   15.  sitting for 1 hour 1 4 4 4   16. Running on even ground 0 0 4 4  17. Running on uneven ground 0 2 3 4   18. Making sharp turns while running fast 0 2 3 4   19. Hopping  0 2 4 4   20. Rolling over in bed 2 4 3 4   Score total:  11/80 56/80 70/80  80/80    POSTURE: No Significant postural limitations  PALPATION: TTP L glute max, piriformis, minimally at L hamstring origin   OBSERVATIONS   L LE significantly longer than R, sx and area of pain consistent with SIJ involvement     LOWER EXTREMITY MMT:  MMT Right eval Left eval Right 09/25/23 Left 09/25/23 Rt/Lt  10/20 Right 12/27/23 Left 12/27/23 Right 02/06/24 Left 02/06/24  Hip flexion 5 5    26  31.0 46.5 45.4  Hip extension 4+ 5 4+ 5  28.7 41.5 49.7 50.3  Hip abduction 4+ 5 5 5  38.4/42.1 42.2 43.5 58.7 60.7  Hip adduction           Hip internal rotation           Hip external rotation           Knee flexion 5 5   35.1/39.6      Knee extension 5 5   54.6/70+ 64.8 77+ 70+ 70+   (Blank  rows = not tested) *= pain/symptoms    TODAY'S TREATMENT:  DATE:  02/06/24 Reassessment Lunges 2 x 10 - good mechanics Dead lift 10# kb 1 x 20 Stairs 7 inch alternating, good mechanics   01/22/24 Floor to stand transfers Squat 2 x 10 Standing dead lift 5# 2 x 10  SL RDL 2 x 10  Small curtsy lunge 2 x 10 Mini SL hip hinge with alt UE reach 10 x 5  01/16/24 Recumb bike L7 5 min  Manual: patellar mobs  Standing dead lift 5# 2 x 10  SL RDL 2 x 10  SL mini squat slide out 3 x 8 3 way Standing lift GTB 2 x 10  12/2 Standing elbows on table, ball behind knee, hip extension Standing hip flexor stretch with glut set Side stepping green tband Standing IT band stretch Standing diag chop in wide tandem Standing head lift/hip hinge Recumb bike L7 3 min cues for scooping with hamstrings and upright posture vs relaxing into chair  12/27/23 Reassessment  RDL BW 2x10 Quadruped fire hydrant x10  12/20/23 STM to glutes/ITB Walking lunges x20 Side lunges x20 Hamstring dynamic stretch x20 High knee stretch x20 Calf raises x20 Light jog in place x20 seconds  Walking on treadmill x10 min  Grade II-III prone hip anterior glides  Gait training in hallway   12/13/23 Elliptical x6 min  Manual: STM to glutes, ITB ITB stretch in supine x1 min Lunges x10 Deep squat x10 Hip hinge x10  SL balance in mirror 2x10 seconds  Education on pain science   PATIENT EDUCATION:  Education details: Anatomy of condition, POC, HEP, exercise form/rationale 11/15/23: education on proper/safe activity 11/20/23: education on relevant anatomy, gym routine, and activity tolerance   Person educated: Patient Education method: Programmer, Multimedia, Demonstration, and Handouts Education comprehension: verbalized understanding, returned demonstration, verbal cues required, and tactile cues  required  HOME EXERCISE PROGRAM: Prehab- Access Code: [email or web address] URL: [email or web address]  Access Code: [email or web address] URL: [email or web address] Date: 10/11/2023 Prepared by: Harlene Cordon    ASSESSMENT:  CLINICAL IMPRESSION: Patient has met 4/4 short term goals and 7/7 long term goals with ability to complete HEP and improvement in symptoms, strength, ROM, activity tolerance, gait, balance, and functional mobility. Overall she is doing great.  Patient without limitation at this time and is ready for d/c to HEP.      OBJECTIVE IMPAIRMENTS: decreased activity tolerance, decreased balance, decreased endurance, decreased mobility, difficulty walking, decreased ROM, decreased strength, increased muscle spasms, impaired flexibility, improper body mechanics, and pain  ACTIVITY LIMITATIONS: lifting, bending, sitting, standing, squatting, stairs, transfers, locomotion level, and caring for others  PARTICIPATION LIMITATIONS: meal prep, cleaning, laundry, shopping, community activity, occupation, and yard work  PERSONAL FACTORS: 3+ comorbidities: Hx osteopenia, hx LBP, HLD, R hip pain are also affecting patient's functional outcome.   REHAB POTENTIAL: Good  CLINICAL DECISION MAKING: Evolving/moderate complexity  EVALUATION COMPLEXITY: Moderate   GOALS: Goals reviewed with patient? Yes  SHORT TERM GOALS: Target date: 4 weeks 9/27  Pain free flexion to 90 Baseline: Goal status: MET   2.  Demo controlled quad set with hold Baseline:  Goal status: MET   3.  30s sit to stand without pain or compensation Baseline:  Goal status: MET   4.  SLS 30s without compensation Baseline:  Goal status: MET    LONG TERM GOALS:  Able to demo step up on 6 step with level pelvis and proper form Baseline:  Goal status: MET 12/27/23  2.  Will tolerate at least 3 min on elliptical, demonstrating good tolerance to repetitive weight  bearing motion Baseline:   Goal status: MET 12/27/23  3.  Demonstrate proper form in at least 10 continuous lunges without increased pain Baseline:  Goal status: MET 02/06/24  4.  Demonstrate ability to get up and down from the floor without limitation by hip pain Baseline:  Goal status: MET 12/27/23  5. Patient will verbalize ability to tolerate 30 minutes on treadmill at home at 3.0 speed  Baseline:  Goal status: MET  6. Patient will be able to complete complete 20 kettle bell deadlifts without significant increase in pain to simulate bending down to pick up things off the ground and improve functional mobility with ADLs.  Baseline:  Goal status: MET 02/06/24  7. Patient will improve HHD testing to within 90% of contralateral limb to indicate overall improved strength.  Baseline:  Goal status: MET 02/06/24  PLAN:  PT FREQUENCY: 1-2x/week  PT DURATION: 6 weeks  PLANNED INTERVENTIONS: 97164- PT Re-evaluation, 97110-Therapeutic exercises, 97530- Therapeutic activity, 97112- Neuromuscular re-education, 97535- Self Care, 02859- Manual therapy, [phone]- Gait training, [phone]- Orthotic Fit/training, [phone]- Canalith repositioning, [id number]- Aquatic Therapy, [phone]- Splinting, [phone]- Wound care (first 20 sq cm), 97598- Wound care (each additional 20 sq cm)Patient/Family education, Balance training, Stair training, Taping, Dry Needling, Joint mobilization, Joint manipulation, Spinal manipulation, Spinal mobilization, Scar mobilization, and DME instructions.  PLAN FOR NEXT SESSION: n/a   Prentice GORMAN Stains, PT, DPT 02/06/2024, 7:55 AM      "

## 2024-02-13 ENCOUNTER — Encounter (HOSPITAL_BASED_OUTPATIENT_CLINIC_OR_DEPARTMENT_OTHER): Admitting: Physical Therapy

## 2024-03-13 ENCOUNTER — Encounter: Payer: Self-pay | Admitting: Internal Medicine
